# Patient Record
Sex: Female | Born: 1965 | Race: White | Hispanic: No | Marital: Single | Smoking: Current every day smoker
Health system: Southern US, Community
[De-identification: ages and names within clinical notes are randomized; demographics above are authoritative.]

## PROBLEM LIST (undated history)

## (undated) DIAGNOSIS — I10 Essential (primary) hypertension: Secondary | ICD-10-CM

## (undated) DIAGNOSIS — M5136 Other intervertebral disc degeneration, lumbar region: Secondary | ICD-10-CM

## (undated) DIAGNOSIS — J449 Chronic obstructive pulmonary disease, unspecified: Secondary | ICD-10-CM

## (undated) DIAGNOSIS — J9621 Acute and chronic respiratory failure with hypoxia: Secondary | ICD-10-CM

## (undated) DIAGNOSIS — K259 Gastric ulcer, unspecified as acute or chronic, without hemorrhage or perforation: Secondary | ICD-10-CM

## (undated) DIAGNOSIS — F32A Depression, unspecified: Secondary | ICD-10-CM

## (undated) DIAGNOSIS — F329 Major depressive disorder, single episode, unspecified: Secondary | ICD-10-CM

## (undated) DIAGNOSIS — R9431 Abnormal electrocardiogram [ECG] [EKG]: Secondary | ICD-10-CM

## (undated) DIAGNOSIS — M1990 Unspecified osteoarthritis, unspecified site: Secondary | ICD-10-CM

## (undated) DIAGNOSIS — N179 Acute kidney failure, unspecified: Secondary | ICD-10-CM

## (undated) DIAGNOSIS — M5126 Other intervertebral disc displacement, lumbar region: Secondary | ICD-10-CM

## (undated) DIAGNOSIS — M5430 Sciatica, unspecified side: Secondary | ICD-10-CM

## (undated) DIAGNOSIS — R6510 Systemic inflammatory response syndrome (SIRS) of non-infectious origin without acute organ dysfunction: Secondary | ICD-10-CM

## (undated) DIAGNOSIS — M51369 Other intervertebral disc degeneration, lumbar region without mention of lumbar back pain or lower extremity pain: Secondary | ICD-10-CM

## (undated) HISTORY — DX: Unspecified osteoarthritis, unspecified site: M19.90

## (undated) HISTORY — DX: Systemic inflammatory response syndrome (sirs) of non-infectious origin without acute organ dysfunction: R65.10

## (undated) HISTORY — DX: Acute and chronic respiratory failure with hypoxia: J96.21

## (undated) HISTORY — PX: KNEE SURGERY: SHX244

## (undated) HISTORY — PX: CHOLECYSTECTOMY: SHX55

## (undated) HISTORY — DX: Abnormal electrocardiogram (ECG) (EKG): R94.31

## (undated) HISTORY — DX: Acute kidney failure, unspecified: N17.9

## (undated) HISTORY — PX: APPENDECTOMY: SHX54

## (undated) HISTORY — DX: Gastric ulcer, unspecified as acute or chronic, without hemorrhage or perforation: K25.9

## (undated) HISTORY — PX: ABDOMINAL HYSTERECTOMY: SHX81

## (undated) HISTORY — PX: FOOT SURGERY: SHX648

---
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
## 2016-02-12 ENCOUNTER — Emergency Department (HOSPITAL_COMMUNITY)
Admission: EM | Admit: 2016-02-12 | Discharge: 2016-02-12 | Disposition: A | Discharge status: Home / Self Care | Payer: No Typology Code available for payment source | Attending: Emergency Medicine | Admitting: Emergency Medicine

## 2016-02-12 ENCOUNTER — Encounter (HOSPITAL_COMMUNITY): Payer: Self-pay

## 2016-02-12 ENCOUNTER — Emergency Department (HOSPITAL_COMMUNITY): Payer: No Typology Code available for payment source

## 2016-02-12 DIAGNOSIS — M25571 Pain in right ankle and joints of right foot: Secondary | ICD-10-CM | POA: Insufficient documentation

## 2016-02-12 DIAGNOSIS — Y92009 Unspecified place in unspecified non-institutional (private) residence as the place of occurrence of the external cause: Secondary | ICD-10-CM | POA: Insufficient documentation

## 2016-02-12 DIAGNOSIS — Y999 Unspecified external cause status: Secondary | ICD-10-CM | POA: Diagnosis not present

## 2016-02-12 DIAGNOSIS — F17200 Nicotine dependence, unspecified, uncomplicated: Secondary | ICD-10-CM | POA: Diagnosis not present

## 2016-02-12 DIAGNOSIS — W01198A Fall on same level from slipping, tripping and stumbling with subsequent striking against other object, initial encounter: Secondary | ICD-10-CM | POA: Diagnosis not present

## 2016-02-12 DIAGNOSIS — M25572 Pain in left ankle and joints of left foot: Secondary | ICD-10-CM | POA: Insufficient documentation

## 2016-02-12 DIAGNOSIS — W19XXXA Unspecified fall, initial encounter: Secondary | ICD-10-CM

## 2016-02-12 DIAGNOSIS — Y939 Activity, unspecified: Secondary | ICD-10-CM | POA: Diagnosis not present

## 2016-02-12 DIAGNOSIS — M25561 Pain in right knee: Secondary | ICD-10-CM | POA: Diagnosis present

## 2016-02-12 MED ORDER — HYDROCODONE-ACETAMINOPHEN 5-325 MG PO TABS: 1.0000 | ORAL_TABLET | Freq: Four times a day (QID) | ORAL | 0 refills | Status: DC | PRN

## 2016-02-12 MED ORDER — OXYCODONE-ACETAMINOPHEN 5-325 MG PO TABS
1.0000 | ORAL_TABLET | Freq: Once | ORAL | Status: AC
  Administered 2016-02-12: 1 via ORAL
  Filled 2016-02-12: qty 1

## 2016-02-12 MED ORDER — NAPROXEN 500 MG PO TABS: 500.0000 mg | ORAL_TABLET | Freq: Two times a day (BID) | ORAL | 0 refills | Status: DC

## 2016-02-12 NOTE — Progress Notes (Signed)
Orthopedic Tech Progress Note Patient Details:  Verdie MosherJulie Owensby 03/12/1966 [id number]  Ortho Devices Type of Ortho Device: Ace wrap, Crutches, Knee Immobilizer Ortho Device/Splint Location: knee imm. r, bi ankle ace wrap Ortho Device/Splint Interventions: Ordered, Application   Trinna PostMartinez, Lamonica Trueba J 02/12/2016, 9:32 PM

## 2016-02-12 NOTE — ED Triage Notes (Signed)
Pt states slipped in water, struck both knees on ground. Pt complaining of bilateral knee pain. Pt ambulatory at triage. No obvious deformity or swelling.

## 2016-02-12 NOTE — ED Provider Notes (Signed)
[facility]-EMERGENCY DEPT Provider Note   CSN: [id number] Arrival date & time: 02/12/16  1949  First Provider Contact:  None    By signing my name below, I, Octavia Heir, attest that this documentation has been prepared under the direction and in the presence of Felicie Morn, NP.  Electronically Signed: Octavia Heir, ED Scribe. 02/12/16. 8:04 PM.    History   Chief Complaint Chief Complaint  Patient presents with  . Fall   The history is provided by the patient. No language interpreter was used.  Fall  This is a new problem. The current episode started 1 to 2 hours ago. The problem occurs rarely. The problem has been gradually worsening. The symptoms are aggravated by walking. The symptoms are relieved by ice. She has tried nothing for the symptoms.   HPI Comments: Bethany Stuart is a 50 y.o. female who presents to the Emergency Department complaining of sudden onset, gradual worsening, moderate, bilateral knee pain s/p a fall that occurred PTA. She notes associated lower back pain and states her right leg and ankle are giving her pain. Pt reports she slipped on water and fell forward landing on her right knee. She states that her feet went underneath her. Pt is ambulatory without difficulty. She notes having ten screws in her right ankle due to having a fracture in the past. Pt did not hit her head or lose consciousness. She denies any other complaints.  History reviewed. No pertinent past medical history.  There are no active problems to display for this patient.   History reviewed. No pertinent surgical history.  OB History    No data available       Home Medications    Prior to Admission medications   Not on File    Family History History reviewed. No pertinent family history.  Social History Social History  Substance Use Topics  . Smoking status: Current Every Day Smoker  . Smokeless tobacco: Never Used  . Alcohol use No     Allergies   Penicillins   Review  of Systems Review of Systems  Musculoskeletal: Positive for arthralgias.     Physical Exam Updated Vital Signs BP (!) 191/110   Pulse 77   Temp 98.4 F (36.9 C) (Oral)   Resp 16   Ht  (1.676 m)   Wt 172 lb (78 kg)   SpO2 96%   BMI 27.76 kg/m   Physical Exam  Constitutional: She is oriented to person, place, and time. She appears well-developed and well-nourished.  HENT:  Head: Normocephalic.  Eyes: EOM are normal.  Neck: Normal range of motion.  Pulmonary/Chest: Effort normal.  Abdominal: She exhibits no distension.  Musculoskeletal: Normal range of motion. She exhibits edema and tenderness.  TTP to right knee, swelling to the right ankle Hips stable Lumbar back discomfort,   Neurological: She is alert and oriented to person, place, and time.  Sensation intact to all extremities  Psychiatric: She has a normal mood and affect.  Nursing note and vitals reviewed.    ED Treatments / Results  DIAGNOSTIC STUDIES: Oxygen Saturation is 96% on RA, adequate by my interpretation.  COORDINATION OF CARE:  8:41 PM Discussed treatment plan with pt at bedside and pt agreed to plan.  Labs (all labs ordered are listed, but only abnormal results are displayed) Labs Reviewed - No data to display  EKG  EKG Interpretation None       Radiology Dg Ankle Complete Left  Result Date: 02/12/2016 CLINICAL DATA:  Acute left ankle pain after fall today. EXAM: LEFT ANKLE COMPLETE - 3+ VIEW COMPARISON:  None. FINDINGS: There is no evidence of fracture, dislocation, or joint effusion. There is no evidence of arthropathy or other focal bone abnormality. Soft tissues are unremarkable. IMPRESSION: Normal left ankle. Electronically Signed   By: Lupita RaiderJames  Green Jr, M.D.   On: 02/12/2016 20:43   Dg Ankle Complete Right  Result Date: 02/12/2016 CLINICAL DATA:  Acute right ankle pain after fall today. EXAM: RIGHT ANKLE - COMPLETE 3+ VIEW COMPARISON:  None. FINDINGS: Status post surgical internal  fixation of old distal fibular and tibial fractures. No acute fracture or dislocation is noted. Joint spaces are intact. No soft tissue abnormality is noted. IMPRESSION: Postsurgical changes as described above. No acute abnormality seen in the right ankle. Electronically Signed   By: Lupita RaiderJames  Green Jr, M.D.   On: 02/12/2016 20:45   Dg Knee Complete 4 Views Right  Result Date: 02/12/2016 CLINICAL DATA:  Right knee pain and swelling after fall today. EXAM: RIGHT KNEE - COMPLETE 4+ VIEW COMPARISON:  None. FINDINGS: No evidence of fracture, dislocation, or joint effusion. Joint spaces are intact. Mild osteophyte formation is noted medially and laterally. Soft tissues are unremarkable. IMPRESSION: Mild degenerative changes are noted. No acute abnormality seen in the right knee. Electronically Signed   By: Lupita RaiderJames  Green Jr, M.D.   On: 02/12/2016 20:41    Procedures Procedures (including critical care time)  Medications Ordered in ED Medications - No data to display      Initial Impression / Assessment and Plan / ED Course  I have reviewed the triage vital signs and the nursing notes.  Pertinent labs & imaging results that were available during my care of the patient were reviewed by me and considered in my medical decision making (see chart for details).  Clinical Course  Patient X-Ray negative for obvious fracture or dislocation.  Pt advised to follow up with orthopedics. Patient given knee splint, ankle ace wrap, and crutches while in ED, conservative therapy recommended and discussed. Patient will be discharged home & is agreeable with above plan. Returns precautions discussed. Pt appears safe for discharge.    Final Clinical Impressions(s) / ED Diagnoses   Final diagnoses:  None  Fall at home after slipping on water on floor. Right knee pain. Bilateral ankle pain.   I personally performed the services described in this documentation, which was scribed in my presence. The recorded  information has been reviewed and is accurate.    New Prescriptions New Prescriptions   No medications on file     Felicie MornDavid Jameila Keeny, NP 02/13/16 [id number]    Pricilla LovelessScott Goldston, MD 02/15/16 [phone]

## 2017-05-26 DIAGNOSIS — I10 Essential (primary) hypertension: Secondary | ICD-10-CM

## 2017-05-26 DIAGNOSIS — J441 Chronic obstructive pulmonary disease with (acute) exacerbation: Secondary | ICD-10-CM

## 2017-05-26 DIAGNOSIS — E663 Overweight: Secondary | ICD-10-CM

## 2017-05-26 DIAGNOSIS — Z720 Tobacco use: Secondary | ICD-10-CM

## 2017-05-27 DIAGNOSIS — R0602 Shortness of breath: Secondary | ICD-10-CM

## 2017-07-14 DIAGNOSIS — Z720 Tobacco use: Secondary | ICD-10-CM

## 2017-07-14 DIAGNOSIS — Z716 Tobacco abuse counseling: Secondary | ICD-10-CM

## 2017-07-14 DIAGNOSIS — J441 Chronic obstructive pulmonary disease with (acute) exacerbation: Secondary | ICD-10-CM

## 2017-07-14 DIAGNOSIS — J189 Pneumonia, unspecified organism: Secondary | ICD-10-CM

## 2017-07-14 DIAGNOSIS — E876 Hypokalemia: Secondary | ICD-10-CM

## 2018-01-08 ENCOUNTER — Emergency Department (HOSPITAL_COMMUNITY)
Admission: EM | Admit: 2018-01-08 | Discharge: 2018-01-09 | Disposition: A | Discharge status: Home / Self Care | Payer: Medicaid Other | Attending: Emergency Medicine | Admitting: Emergency Medicine

## 2018-01-08 ENCOUNTER — Other Ambulatory Visit: Payer: Self-pay

## 2018-01-08 ENCOUNTER — Encounter (HOSPITAL_COMMUNITY): Payer: Self-pay | Admitting: Emergency Medicine

## 2018-01-08 DIAGNOSIS — M5432 Sciatica, left side: Secondary | ICD-10-CM | POA: Insufficient documentation

## 2018-01-08 DIAGNOSIS — M545 Low back pain: Secondary | ICD-10-CM | POA: Diagnosis present

## 2018-01-08 DIAGNOSIS — M5431 Sciatica, right side: Secondary | ICD-10-CM | POA: Diagnosis not present

## 2018-01-08 DIAGNOSIS — F17200 Nicotine dependence, unspecified, uncomplicated: Secondary | ICD-10-CM | POA: Insufficient documentation

## 2018-01-08 NOTE — ED Triage Notes (Signed)
Pt states she has hx of sciatica and bulging discs.  Unable to control pain at home with ibuprofen. Radiates down right leg.

## 2018-01-09 LAB — URINALYSIS, ROUTINE W REFLEX MICROSCOPIC
Bilirubin Urine: NEGATIVE
Glucose, UA: NEGATIVE mg/dL
Hgb urine dipstick: NEGATIVE
Ketones, ur: NEGATIVE mg/dL
LEUKOCYTES UA: NEGATIVE
NITRITE: NEGATIVE
Protein, ur: NEGATIVE mg/dL
SPECIFIC GRAVITY, URINE: 1.009 (ref 1.005–1.030)
pH: 5 (ref 5.0–8.0)

## 2018-01-09 MED ORDER — HYDROCODONE-ACETAMINOPHEN 5-325 MG PO TABS: 1.0000 | ORAL_TABLET | ORAL | 0 refills | Status: DC | PRN

## 2018-01-09 MED ORDER — METHOCARBAMOL 500 MG PO TABS: 500.0000 mg | ORAL_TABLET | Freq: Two times a day (BID) | ORAL | 0 refills | Status: DC

## 2018-01-09 MED ORDER — PREDNISONE 10 MG PO TABS: ORAL_TABLET | ORAL | 0 refills | Status: DC

## 2018-01-09 MED ORDER — HYDROCODONE-ACETAMINOPHEN 5-325 MG PO TABS
2.0000 | ORAL_TABLET | Freq: Once | ORAL | Status: AC
  Administered 2018-01-09: 2 via ORAL
  Filled 2018-01-09: qty 2

## 2018-01-09 MED ORDER — KETOROLAC TROMETHAMINE 30 MG/ML IJ SOLN
30.0000 mg | Freq: Once | INTRAMUSCULAR | Status: AC
  Administered 2018-01-09: 30 mg via INTRAMUSCULAR
  Filled 2018-01-09: qty 1

## 2018-01-09 NOTE — Discharge Instructions (Addendum)
Follow up with your Physician for recheck  

## 2018-01-09 NOTE — ED Provider Notes (Signed)
[facility] EMERGENCY DEPARTMENT Provider Note   CSN: [id number] Arrival date & time: 01/08/18  2326     History   Chief Complaint Chief Complaint  Patient presents with  . Back Pain    HPI Bethany Stuart is a 52 y.o. female.  The history is provided by the patient. No language interpreter was used.  Back Pain   This is a new problem. The problem occurs constantly. The problem has been gradually worsening. The pain is associated with no known injury. The pain is present in the lumbar spine. The quality of the pain is described as shooting. The pain radiates to the right thigh. The pain is moderate. She has tried nothing for the symptoms. The treatment provided no relief.  Pt reports she has a history of sciatica.  Pt complains of pain radiating from low back down right leg.  Pt complains of pain shooting into vaginal area dn rectum.  No incontience.   History reviewed. No pertinent past medical history.  There are no active problems to display for this patient.   History reviewed. No pertinent surgical history.   OB History   None      Home Medications    Prior to Admission medications   Medication Sig Start Date End Date Taking? Authorizing Provider  HYDROcodone-acetaminophen (NORCO/VICODIN) 5-325 MG tablet Take 1 tablet by mouth every 6 (six) hours as needed for severe pain. 02/12/16   Felicie Morn, NP  naproxen (NAPROSYN) 500 MG tablet Take 1 tablet (500 mg total) by mouth 2 (two) times daily. 02/12/16   Felicie Morn, NP    Family History No family history on file.  Social History Social History   Tobacco Use  . Smoking status: Current Every Day Smoker  . Smokeless tobacco: Never Used  Substance Use Topics  . Alcohol use: No  . Drug use: No     Allergies   Penicillins   Review of Systems Review of Systems  Musculoskeletal: Positive for back pain.  All other systems reviewed and are negative.    Physical Exam Updated Vital Signs BP  (!) 140/97 (BP Location: Right Arm)   Pulse 80   Temp 98.3 F (36.8 C) (Oral)   Resp 18   Ht 5\' 7"  (1.702 m)   Wt 74.4 kg (164 lb)   SpO2 99%   BMI 25.69 kg/m   Physical Exam  Constitutional: She is oriented to person, place, and time. She appears well-developed and well-nourished.  HENT:  Head: Normocephalic.  Eyes: EOM are normal.  Neck: Normal range of motion.  Cardiovascular: Normal rate and regular rhythm.  Pulmonary/Chest: Effort normal.  Abdominal: Soft. She exhibits no distension.  Musculoskeletal: Normal range of motion. She exhibits tenderness.  Neurological: She is alert and oriented to person, place, and time.  Skin: Skin is warm.  Psychiatric: She has a normal mood and affect.  Nursing note and vitals reviewed.    ED Treatments / Results  Labs (all labs ordered are listed, but only abnormal results are displayed) Labs Reviewed  URINALYSIS, ROUTINE W REFLEX MICROSCOPIC    EKG None  Radiology No results found.  Procedures Procedures (including critical care time)  Medications Ordered in ED Medications  HYDROcodone-acetaminophen (NORCO/VICODIN) 5-325 MG per tablet 2 tablet (2 tablets Oral Given 01/09/18 0041)     Initial Impression / Assessment and Plan / ED Course  I have reviewed the triage vital signs and the nursing notes.  Pertinent labs & imaging results that were  available during my care of the patient were reviewed by me and considered in my medical decision making (see chart for details).  Clinical Course as of Jan 10 52  Fri Jan 09, 2018  0036 Urinalysis, Routine w reflex microscopic [LS]    Clinical Course User Index [LS] Elson AreasSofia, Felma Pfefferle K, PA-C    Pt given hydrocodone 2 tablets here.    Final Clinical Impressions(s) / ED Diagnoses   Final diagnoses:  None    ED Discharge Orders    None       Osie CheeksSofia, Joelyn Lover K, PA-C 01/09/18 Geralyn [id number]    Delo, Douglas, MD 01/09/18 [phone]

## 2018-01-12 ENCOUNTER — Emergency Department (HOSPITAL_COMMUNITY): Payer: Medicaid Other

## 2018-01-12 ENCOUNTER — Emergency Department (HOSPITAL_COMMUNITY)
Admission: EM | Admit: 2018-01-12 | Discharge: 2018-01-13 | Disposition: A | Discharge status: Home / Self Care | Payer: Medicaid Other | Attending: Emergency Medicine | Admitting: Emergency Medicine

## 2018-01-12 ENCOUNTER — Other Ambulatory Visit: Payer: Self-pay

## 2018-01-12 ENCOUNTER — Encounter (HOSPITAL_COMMUNITY): Payer: Self-pay

## 2018-01-12 DIAGNOSIS — M5441 Lumbago with sciatica, right side: Secondary | ICD-10-CM | POA: Insufficient documentation

## 2018-01-12 DIAGNOSIS — G8929 Other chronic pain: Secondary | ICD-10-CM

## 2018-01-12 DIAGNOSIS — M545 Low back pain: Secondary | ICD-10-CM | POA: Diagnosis present

## 2018-01-12 DIAGNOSIS — F17210 Nicotine dependence, cigarettes, uncomplicated: Secondary | ICD-10-CM | POA: Insufficient documentation

## 2018-01-12 HISTORY — DX: Other intervertebral disc degeneration, lumbar region without mention of lumbar back pain or lower extremity pain: M51.369

## 2018-01-12 HISTORY — DX: Other intervertebral disc degeneration, lumbar region: M51.36

## 2018-01-12 HISTORY — DX: Sciatica, unspecified side: M54.30

## 2018-01-12 HISTORY — DX: Other intervertebral disc displacement, lumbar region: M51.26

## 2018-01-12 MED ORDER — OXYCODONE-ACETAMINOPHEN 5-325 MG PO TABS
1.0000 | ORAL_TABLET | Freq: Once | ORAL | Status: AC
  Administered 2018-01-12: 1 via ORAL
  Filled 2018-01-12: qty 1

## 2018-01-12 NOTE — ED Notes (Signed)
Patient transported to CT 

## 2018-01-12 NOTE — ED Provider Notes (Signed)
[facility] EMERGENCY DEPARTMENT Provider Note   CSN: [id number] Arrival date & time: 01/12/18  1935     History   Chief Complaint Chief Complaint  Patient presents with  . Back Pain    HPI Jameelah Watts is a 52 y.o. female.  HPI 52 year old Caucasian female past medical history significant for chronic back pain with herniated disc and sciatic nerve pain presents to the emergency department today for lower back pain.  Patient states is been for the past 1.5 weeks.  She states that she was seen on 7/4 for same symptoms and given steroids, muscle relaxers and narcotic pain medication which is not helping her pain.  Patient states the pain is worse with palpation and range of motion.  Denies any associated urinary symptoms.  She states the pain is cramping in nature.  Patient denies any change in bowel habits. Pt denies any ha, night sweats, hx of ivdu/cancer, loss or bowel or bladder, urinary retention, saddle paresthesias, lower extremity paresthesias.  Patient has not followed up with a specialist because she has no insurance.  Denies any recent trauma.  Family concerned that she may need an MRI.  Past Medical History:  Diagnosis Date  . Bulging lumbar disc   . Sciatica     There are no active problems to display for this patient.   History reviewed. No pertinent surgical history.   OB History   None      Home Medications    Prior to Admission medications   Medication Sig Start Date End Date Taking? Authorizing Provider  HYDROcodone-acetaminophen (NORCO/VICODIN) 5-325 MG tablet Take 1 tablet by mouth every 4 (four) hours as needed. 01/09/18   Elson Areas, PA-C  methocarbamol (ROBAXIN) 500 MG tablet Take 1 tablet (500 mg total) by mouth 2 (two) times daily. 01/09/18   Elson Areas, PA-C  predniSONE (DELTASONE) 10 MG tablet 6,5,4,3,2,1 taper 01/09/18   Elson Areas, PA-C    Family History History reviewed. No pertinent family history.  Social  History Social History   Tobacco Use  . Smoking status: Current Every Day Smoker  . Smokeless tobacco: Never Used  Substance Use Topics  . Alcohol use: No  . Drug use: No     Allergies   Fioricet [butalbital-apap-caffeine]; Klonopin [clonazepam]; and Penicillins   Review of Systems Review of Systems  Constitutional: Negative for chills and fever.  Gastrointestinal: Negative for abdominal pain and vomiting.  Genitourinary: Negative for dysuria, frequency and hematuria.  Musculoskeletal: Positive for arthralgias and back pain.  Skin: Negative for color change.  Neurological: Negative for weakness and numbness.     Physical Exam Updated Vital Signs BP 103/70 (BP Location: Left Arm)   Pulse 80   Temp 98.5 F (36.9 C) (Oral)   Resp 15   Ht 5\' 7"  (1.702 m)   Wt 75.3 kg (166 lb)   SpO2 98%   BMI 26.00 kg/m   Physical Exam  Constitutional: She appears well-developed and well-nourished. No distress.  Tender to palpation of the L-spine and right lumbar paraspinal musculature.  Pain radiates to the right buttocks.  HENT:  Head: Normocephalic and atraumatic.  Eyes: Right eye exhibits no discharge. Left eye exhibits no discharge. No scleral icterus.  Neck: Normal range of motion.  Pulmonary/Chest: No respiratory distress.  Abdominal: Soft. Bowel sounds are normal. She exhibits no distension. There is no tenderness. There is no rebound and no guarding.  Musculoskeletal: Normal range of motion.  Back:  Full range of motion of all joints of the lower extremities.  DP pulses are 2+ bilaterally.  Brisk cap refill.  Compartments are soft.  Neurological: She is alert.  Strength 5 out of 5 in lower extremities.  Sensation intact.  Skin: Skin is warm and dry. Capillary refill takes less than 2 seconds. No pallor.  No rash or tick bite noted.  Psychiatric: Her behavior is normal. Judgment and thought content normal.  Nursing note and vitals reviewed.    ED Treatments /  Results  Labs (all labs ordered are listed, but only abnormal results are displayed) Labs Reviewed  I-STAT CREATININE, ED    EKG None  Radiology No results found.  Procedures Procedures (including critical care time)  Medications Ordered in ED Medications  oxyCODONE-acetaminophen (PERCOCET/ROXICET) 5-325 MG per tablet 1 tablet (1 tablet Oral Given 01/12/18 2313)     Initial Impression / Assessment and Plan / ED Course  I have reviewed the triage vital signs and the nursing notes.  Pertinent labs & imaging results that were available during my care of the patient were reviewed by me and considered in my medical decision making (see chart for details).     Patient with back pain.  History of chronic back pain.  Seen 4 days ago with same symptoms and given medication at that time.  Patient in normal urine at that time.  Doubt pyelonephritis or UTI.  Given patient's return to ED if further imaging was obtained including CT renal stone study.  This showed a chronic back changes but no acute findings.  Patient's kidney function was slightly elevated at 1.5.  Unknown baseline.  Instructed patient to have this followed up with her primary care doctor.  Instructed to avoid a significant amount of NSAIDs.  Patient's has a hypokalemia 3.0 she was given oral potassium will be discharged with several days of oral potassium and have this followed up with her primary care doctor and recheck.  Patient's pain is reproducible on palpation.  Seems consistent muscular skeletal or sciatic nerve pain.  No neurological deficits and normal neuro exam.  Patient can walk but states is painful.  No loss of bowel or bladder control.  No concern for cauda equina.  No fever, night sweats, weight loss, h/o cancer, IVDU.  RICE protocol and pain medicine indicated and discussed with patient.  Pt is hemodynamically stable, in NAD, & able to ambulate in the ED. Evaluation does not show pathology that would require ongoing  emergent intervention or inpatient treatment. I explained the diagnosis to the patient. Pain has been managed & has no complaints prior to dc. Pt is comfortable with above plan and is stable for discharge at this time. All questions were answered prior to disposition. Strict return precautions for f/u to the ED were discussed. Encouraged follow up with PCP.     Final Clinical Impressions(s) / ED Diagnoses   Final diagnoses:  Chronic right-sided low back pain with right-sided sciatica    ED Discharge Orders        Ordered    lidocaine (LIDODERM) 5 %  Every 24 hours     01/13/18 0040    oxyCODONE-acetaminophen (PERCOCET/ROXICET) 5-325 MG tablet  Every 4 hours PRN     01/13/18 0040    potassium chloride (K-DUR) 10 MEQ tablet  Daily     01/13/18 0042       Rise MuLeaphart, Kenneth T, PA-C 01/13/18 0044    Maia PlanLong, Joshua G, MD 01/13/18 1115

## 2018-01-12 NOTE — ED Notes (Signed)
ED Provider at bedside. 

## 2018-01-12 NOTE — ED Triage Notes (Signed)
Pt endorses lower back pain for several weeks, has hx of sciatica and bulging discs. Was seen 07/04 here and given steroids, muscle relaxer and pain medications and it is not helping. VSS. No urinary or neuro sx.

## 2018-01-13 LAB — I-STAT CHEM 8, ED
BUN: 25 mg/dL — AB (ref 6–20)
CREATININE: 1.5 mg/dL — AB (ref 0.44–1.00)
Calcium, Ion: 1.14 mmol/L — ABNORMAL LOW (ref 1.15–1.40)
Chloride: 99 mmol/L (ref 98–111)
Glucose, Bld: 91 mg/dL (ref 70–99)
HEMATOCRIT: 48 % — AB (ref 36.0–46.0)
Hemoglobin: 16.3 g/dL — ABNORMAL HIGH (ref 12.0–15.0)
Potassium: 3 mmol/L — ABNORMAL LOW (ref 3.5–5.1)
SODIUM: 140 mmol/L (ref 135–145)
TCO2: 28 mmol/L (ref 22–32)

## 2018-01-13 LAB — I-STAT CREATININE, ED: Creatinine, Ser: 1.4 mg/dL — ABNORMAL HIGH (ref 0.44–1.00)

## 2018-01-13 MED ORDER — POTASSIUM CHLORIDE ER 10 MEQ PO TBCR: 10.0000 meq | EXTENDED_RELEASE_TABLET | Freq: Every day | ORAL | 0 refills | Status: DC

## 2018-01-13 MED ORDER — POTASSIUM CHLORIDE CRYS ER 20 MEQ PO TBCR
40.0000 meq | EXTENDED_RELEASE_TABLET | Freq: Once | ORAL | Status: AC
  Administered 2018-01-13: 40 meq via ORAL
  Filled 2018-01-13: qty 2

## 2018-01-13 MED ORDER — LIDOCAINE 5 % EX PTCH: 1.0000 | MEDICATED_PATCH | CUTANEOUS | 0 refills | Status: DC

## 2018-01-13 MED ORDER — OXYCODONE-ACETAMINOPHEN 5-325 MG PO TABS: 1.0000 | ORAL_TABLET | ORAL | 0 refills | Status: DC | PRN

## 2018-01-13 NOTE — ED Notes (Signed)
Patient Alert and oriented to baseline. Stable and ambulatory to baseline. Patient verbalized understanding of the discharge instructions.  Patient belongings were taken by the patient.   

## 2018-01-13 NOTE — Discharge Instructions (Signed)
The CT of your abdomen and back did not show any acute findings.  Does note chronic back problems.  This is likely causing her pain.  Your kidney function is mildly elevated.  Make sure drink plenty of fluids and have this rechecked with her primary care doctor.  Your potassium was also slightly low.  Please take the potassium that I gave you for the next 3 to 4 days.  Make sure you are increasing potassium at home and have this rechecked with your primary care doctor.  Have given you lidocaine patches to apply to your back to help with pain.  Have given you a short course of stronger pain medication this medication is a narcotic pain medication make you drowsy so and can be addicting so be careful with taking this medication.  Avoid any prolonged NSAID use.  Please follow-up with primary care doctor and orthopedic doctor.  SEEK IMMEDIATE MEDICAL ATTENTION IF: New numbness, tingling, weakness, or problem with the use of your arms or legs.  Severe back pain not relieved with medications.  Change in bowel or bladder control.  Urinary retention.  Numbness in your groin.  Increasing pain in any areas of the body (such as chest or abdominal pain).  Shortness of breath, dizziness or fainting.  Nausea (feeling sick to your stomach), vomiting, fever, or sweats.

## 2018-04-12 ENCOUNTER — Encounter (HOSPITAL_BASED_OUTPATIENT_CLINIC_OR_DEPARTMENT_OTHER): Payer: Self-pay | Admitting: Emergency Medicine

## 2018-04-12 ENCOUNTER — Other Ambulatory Visit: Payer: Self-pay

## 2018-04-12 ENCOUNTER — Emergency Department (HOSPITAL_BASED_OUTPATIENT_CLINIC_OR_DEPARTMENT_OTHER)
Admission: EM | Admit: 2018-04-12 | Discharge: 2018-04-12 | Disposition: A | Discharge status: Home / Self Care | Payer: Medicaid Other | Attending: Emergency Medicine | Admitting: Emergency Medicine

## 2018-04-12 DIAGNOSIS — J449 Chronic obstructive pulmonary disease, unspecified: Secondary | ICD-10-CM | POA: Diagnosis not present

## 2018-04-12 DIAGNOSIS — F17200 Nicotine dependence, unspecified, uncomplicated: Secondary | ICD-10-CM | POA: Diagnosis not present

## 2018-04-12 DIAGNOSIS — M5416 Radiculopathy, lumbar region: Secondary | ICD-10-CM | POA: Diagnosis not present

## 2018-04-12 DIAGNOSIS — M545 Low back pain: Secondary | ICD-10-CM | POA: Diagnosis present

## 2018-04-12 DIAGNOSIS — Z79899 Other long term (current) drug therapy: Secondary | ICD-10-CM | POA: Diagnosis not present

## 2018-04-12 DIAGNOSIS — I10 Essential (primary) hypertension: Secondary | ICD-10-CM | POA: Diagnosis not present

## 2018-04-12 HISTORY — DX: Essential (primary) hypertension: I10

## 2018-04-12 HISTORY — DX: Chronic obstructive pulmonary disease, unspecified: J44.9

## 2018-04-12 MED ORDER — ACETAMINOPHEN ER 650 MG PO TBCR: 650.0000 mg | EXTENDED_RELEASE_TABLET | Freq: Three times a day (TID) | ORAL | 0 refills | Status: DC

## 2018-04-12 MED ORDER — PREDNISONE 10 MG PO TABS: ORAL_TABLET | ORAL | 0 refills | Status: DC

## 2018-04-12 MED ORDER — IBUPROFEN 600 MG PO TABS: 600.0000 mg | ORAL_TABLET | Freq: Four times a day (QID) | ORAL | 0 refills | Status: DC | PRN

## 2018-04-12 MED ORDER — LIDOCAINE 5 % EX PTCH
1.0000 | MEDICATED_PATCH | CUTANEOUS | Status: DC
  Filled 2018-04-12: qty 1

## 2018-04-12 MED ORDER — NAPROXEN 250 MG PO TABS
500.0000 mg | ORAL_TABLET | Freq: Once | ORAL | Status: AC
  Administered 2018-04-12: 500 mg via ORAL
  Filled 2018-04-12: qty 2

## 2018-04-12 MED ORDER — LIDOCAINE 5 % EX PTCH: 1.0000 | MEDICATED_PATCH | CUTANEOUS | 0 refills | Status: DC

## 2018-04-12 MED ORDER — OXYCODONE-ACETAMINOPHEN 5-325 MG PO TABS
1.0000 | ORAL_TABLET | Freq: Once | ORAL | Status: AC
  Administered 2018-04-12: 1 via ORAL
  Filled 2018-04-12: qty 1

## 2018-04-12 MED ORDER — OXYCODONE-ACETAMINOPHEN 5-325 MG PO TABS: 1.0000 | ORAL_TABLET | Freq: Two times a day (BID) | ORAL | 0 refills | Status: DC | PRN

## 2018-04-12 MED ORDER — METHOCARBAMOL 500 MG PO TABS
500.0000 mg | ORAL_TABLET | Freq: Once | ORAL | Status: AC
  Administered 2018-04-12: 500 mg via ORAL
  Filled 2018-04-12: qty 1

## 2018-04-12 NOTE — ED Provider Notes (Signed)
[facility] EMERGENCY DEPARTMENT Provider Note   CSN: [id number] Arrival date & time: 04/12/18  0731     History   Chief Complaint Chief Complaint  Patient presents with  . Back Pain    HPI Bethany Stuart is a 52 y.o. female.  HPI  52 year old female comes in with chief complaint of back pain. Patient has history of COPD, sciatica and degenerative disc disease.  Patient reports that her current pain started 4 days ago, soon after she had an epidural injection.  Her pain is located in the lower spine and is radiating down her right lower extremity.  Pain is described as sharp, without any associated numbness or tingling.  She also denies any urinary incontinence, urinary retention, bowel incontinence or saddle anesthesia. No specific evoking factors.  Patient has taken over-the-counter ibuprofen, muscle relaxants without significant pain relief.  Past Medical History:  Diagnosis Date  . Bulging lumbar disc   . COPD (chronic obstructive pulmonary disease) (HCC)   . Hypertension   . Sciatica     There are no active problems to display for this patient.   Past Surgical History:  Procedure Laterality Date  . ABDOMINAL HYSTERECTOMY    . APPENDECTOMY    . CHOLECYSTECTOMY       OB History   None      Home Medications    Prior to Admission medications   Medication Sig Start Date End Date Taking? Authorizing Provider  lisinopril (PRINIVIL,ZESTRIL) 20 MG tablet Take 20 mg by mouth daily.   Yes [provider]  acetaminophen (TYLENOL 8 HOUR) 650 MG CR tablet Take 1 tablet (650 mg total) by mouth every 8 (eight) hours. 04/12/18   Derwood Kaplan, MD  HYDROcodone-acetaminophen (NORCO/VICODIN) 5-325 MG tablet Take 1 tablet by mouth every 4 (four) hours as needed. 01/09/18   Elson Areas, PA-C  ibuprofen (ADVIL,MOTRIN) 600 MG tablet Take 1 tablet (600 mg total) by mouth every 6 (six) hours as needed. 04/12/18   Derwood Kaplan, MD  lidocaine (LIDODERM) 5 %  Place 1 patch onto the skin daily. 04/12/18   Derwood Kaplan, MD  methocarbamol (ROBAXIN) 500 MG tablet Take 1 tablet (500 mg total) by mouth 2 (two) times daily. 01/09/18   Elson Areas, PA-C  oxyCODONE-acetaminophen (PERCOCET/ROXICET) 5-325 MG tablet Take 1 tablet by mouth every 12 (twelve) hours as needed for severe pain. 04/12/18   Derwood Kaplan, MD  potassium chloride (K-DUR) 10 MEQ tablet Take 1 tablet (10 mEq total) by mouth daily. 01/13/18   Rise Mu, PA-C  predniSONE (DELTASONE) 10 MG tablet 6,5,4,3,2,1 taper 04/12/18   Derwood Kaplan, MD    Family History No family history on file.  Social History Social History   Tobacco Use  . Smoking status: Current Every Day Smoker  . Smokeless tobacco: Never Used  Substance Use Topics  . Alcohol use: No  . Drug use: No     Allergies   Fioricet [butalbital-apap-caffeine]; Klonopin [clonazepam]; and Penicillins   Review of Systems Review of Systems  Constitutional: Positive for activity change.  Cardiovascular: Negative for chest pain.  Gastrointestinal: Negative for nausea.  Musculoskeletal: Positive for back pain.     Physical Exam Updated Vital Signs BP (!) 176/93 (BP Location: Right Arm)   Pulse 77   Temp 98.3 F (36.8 C) (Oral)   Resp 20   Ht 5\' 7"  (1.702 m)   Wt 76.2 kg   SpO2 100%   BMI 26.31 kg/m   Physical Exam  Constitutional: She is oriented to person, place, and time. She appears well-developed.  HENT:  Head: Normocephalic and atraumatic.  Eyes: EOM are normal.  Neck: Normal range of motion. Neck supple.  Cardiovascular: Normal rate.  Pulmonary/Chest: Effort normal.  Abdominal: Bowel sounds are normal.  Musculoskeletal:  Pt has tenderness over the lumbar region No step offs, no erythema. Pt has 2+ patellar reflex bilaterally. Able to discriminate between sharp and dull. Able to ambulate Positive passive straight leg raise test  Neurological: She is alert and oriented to person, place,  and time.  Skin: Skin is warm and dry.  Nursing note and vitals reviewed.    ED Treatments / Results  Labs (all labs ordered are listed, but only abnormal results are displayed) Labs Reviewed - No data to display  EKG None  Radiology No results found.  Procedures Procedures (including critical care time)  Medications Ordered in ED Medications  lidocaine (LIDODERM) 5 % 1 patch (1 patch Transdermal Not Given 04/12/18 0802)  oxyCODONE-acetaminophen (PERCOCET/ROXICET) 5-325 MG per tablet 1 tablet (1 tablet Oral Given 04/12/18 0802)  naproxen (NAPROSYN) tablet 500 mg (500 mg Oral Given 04/12/18 0802)  methocarbamol (ROBAXIN) tablet 500 mg (500 mg Oral Given 04/12/18 0801)     Initial Impression / Assessment and Plan / ED Course  I have reviewed the triage vital signs and the nursing notes.  Pertinent labs & imaging results that were available during my care of the patient were reviewed by me and considered in my medical decision making (see chart for details).     52 year old female comes in with chief complaint of severe back pain that appears to be radicular in nature.  She has known degenerative disc disease of the lumbar spine, and it appears that she has attempted conservative measures at home without significant relief.  MRI results from the outpatient setting discussed and reviewed. No red flags on history or exam suggesting spinal cord compression or cauda equina.  No clinical concerns for epidural and infection at this time given lack of any fevers, chills, sweats.  Patient has a narcotic score of 270 -mainly because she has tramadol prescription that is active.  Patient's last received hydrocodone-oxycodone in July, when a total of around 35 mg tablets were dispensed.  Risk of opioid use discussed with the patient. We will give her 5 to 6 tablets for severe pain only.  Final Clinical Impressions(s) / ED Diagnoses   Final diagnoses:  Radiculopathy of lumbar region     ED Discharge Orders         Ordered    oxyCODONE-acetaminophen (PERCOCET/ROXICET) 5-325 MG tablet  Every 12 hours PRN     04/12/18 0828    acetaminophen (TYLENOL 8 HOUR) 650 MG CR tablet  Every 8 hours     04/12/18 0828    ibuprofen (ADVIL,MOTRIN) 600 MG tablet  Every 6 hours PRN     04/12/18 0828    predniSONE (DELTASONE) 10 MG tablet     04/12/18 0828    lidocaine (LIDODERM) 5 %  Every 24 hours     04/12/18 0831           Derwood Kaplan, MD 04/12/18 [phone]

## 2018-04-12 NOTE — Discharge Instructions (Signed)
You are seen in the ER for your pain. As discussed, the pain is because of your chronic degenerative disc disease and spine disease.  Please see your spine doctor and PCP for further care. We will be sending you home with multiple medications for appropriate pain control. Please be aware, narcotic medications are not the preferred way of treating chronic pain -therefore take the medication only when the pain is excruciating.

## 2018-04-12 NOTE — ED Notes (Signed)
ED Provider at bedside. 

## 2018-04-12 NOTE — ED Triage Notes (Signed)
Chronic low back pain. Has been seeing ortho and had an epidural injection 1 week ago. States the pain is worse since the injection. Pain radiates down R leg.

## 2018-06-03 ENCOUNTER — Emergency Department (HOSPITAL_BASED_OUTPATIENT_CLINIC_OR_DEPARTMENT_OTHER): Payer: Medicaid Other

## 2018-06-03 ENCOUNTER — Emergency Department (HOSPITAL_BASED_OUTPATIENT_CLINIC_OR_DEPARTMENT_OTHER)
Admission: EM | Admit: 2018-06-03 | Discharge: 2018-06-03 | Disposition: A | Discharge status: Home / Self Care | Payer: Medicaid Other | Attending: Emergency Medicine | Admitting: Emergency Medicine

## 2018-06-03 ENCOUNTER — Encounter (HOSPITAL_BASED_OUTPATIENT_CLINIC_OR_DEPARTMENT_OTHER): Payer: Self-pay | Admitting: Emergency Medicine

## 2018-06-03 ENCOUNTER — Other Ambulatory Visit: Payer: Self-pay

## 2018-06-03 DIAGNOSIS — Z79899 Other long term (current) drug therapy: Secondary | ICD-10-CM | POA: Diagnosis not present

## 2018-06-03 DIAGNOSIS — I10 Essential (primary) hypertension: Secondary | ICD-10-CM | POA: Insufficient documentation

## 2018-06-03 DIAGNOSIS — W19XXXA Unspecified fall, initial encounter: Secondary | ICD-10-CM

## 2018-06-03 DIAGNOSIS — F17210 Nicotine dependence, cigarettes, uncomplicated: Secondary | ICD-10-CM | POA: Diagnosis not present

## 2018-06-03 DIAGNOSIS — J449 Chronic obstructive pulmonary disease, unspecified: Secondary | ICD-10-CM | POA: Diagnosis not present

## 2018-06-03 DIAGNOSIS — S20211A Contusion of right front wall of thorax, initial encounter: Secondary | ICD-10-CM

## 2018-06-03 DIAGNOSIS — Y9389 Activity, other specified: Secondary | ICD-10-CM | POA: Insufficient documentation

## 2018-06-03 DIAGNOSIS — R1011 Right upper quadrant pain: Secondary | ICD-10-CM | POA: Diagnosis not present

## 2018-06-03 DIAGNOSIS — Y92002 Bathroom of unspecified non-institutional (private) residence single-family (private) house as the place of occurrence of the external cause: Secondary | ICD-10-CM | POA: Insufficient documentation

## 2018-06-03 DIAGNOSIS — W01198A Fall on same level from slipping, tripping and stumbling with subsequent striking against other object, initial encounter: Secondary | ICD-10-CM | POA: Insufficient documentation

## 2018-06-03 DIAGNOSIS — S299XXA Unspecified injury of thorax, initial encounter: Secondary | ICD-10-CM | POA: Diagnosis present

## 2018-06-03 DIAGNOSIS — Y999 Unspecified external cause status: Secondary | ICD-10-CM | POA: Diagnosis not present

## 2018-06-03 LAB — CBC WITH DIFFERENTIAL/PLATELET
Abs Immature Granulocytes: 0.04 10*3/uL (ref 0.00–0.07)
BASOS ABS: 0.1 10*3/uL (ref 0.0–0.1)
BASOS PCT: 1 %
EOS ABS: 0.3 10*3/uL (ref 0.0–0.5)
EOS PCT: 3 %
HCT: 48.6 % — ABNORMAL HIGH (ref 36.0–46.0)
Hemoglobin: 16.1 g/dL — ABNORMAL HIGH (ref 12.0–15.0)
IMMATURE GRANULOCYTES: 0 %
Lymphocytes Relative: 24 %
Lymphs Abs: 2.5 10*3/uL (ref 0.7–4.0)
MCH: 31 pg (ref 26.0–34.0)
MCHC: 33.1 g/dL (ref 30.0–36.0)
MCV: 93.6 fL (ref 80.0–100.0)
Monocytes Absolute: 0.7 10*3/uL (ref 0.1–1.0)
Monocytes Relative: 7 %
NEUTROS PCT: 65 %
NRBC: 0 % (ref 0.0–0.2)
Neutro Abs: 7.2 10*3/uL (ref 1.7–7.7)
PLATELETS: 240 10*3/uL (ref 150–400)
RBC: 5.19 MIL/uL — ABNORMAL HIGH (ref 3.87–5.11)
RDW: 13.7 % (ref 11.5–15.5)
WBC: 10.8 10*3/uL — ABNORMAL HIGH (ref 4.0–10.5)

## 2018-06-03 LAB — COMPREHENSIVE METABOLIC PANEL
ALK PHOS: 72 U/L (ref 38–126)
ALT: 15 U/L (ref 0–44)
ANION GAP: 9 (ref 5–15)
AST: 19 U/L (ref 15–41)
Albumin: 4.2 g/dL (ref 3.5–5.0)
BILIRUBIN TOTAL: 0.9 mg/dL (ref 0.3–1.2)
BUN: 11 mg/dL (ref 6–20)
CALCIUM: 9.3 mg/dL (ref 8.9–10.3)
CO2: 31 mmol/L (ref 22–32)
CREATININE: 0.86 mg/dL (ref 0.44–1.00)
Chloride: 102 mmol/L (ref 98–111)
GFR calc non Af Amer: >60 mL/min (ref >60)
Glucose, Bld: 134 mg/dL — ABNORMAL HIGH (ref 70–99)
Potassium: 2.9 mmol/L — ABNORMAL LOW (ref 3.5–5.1)
SODIUM: 142 mmol/L (ref 135–145)
TOTAL PROTEIN: 7.5 g/dL (ref 6.5–8.1)

## 2018-06-03 MED ORDER — IOPAMIDOL (ISOVUE-300) INJECTION 61%
100.0000 mL | Freq: Once | INTRAVENOUS | Status: AC | PRN
  Administered 2018-06-03: 100 mL via INTRAVENOUS

## 2018-06-03 MED ORDER — SODIUM CHLORIDE 0.9 % IV BOLUS
500.0000 mL | Freq: Once | INTRAVENOUS | Status: AC
  Administered 2018-06-03: 500 mL via INTRAVENOUS

## 2018-06-03 MED ORDER — MORPHINE SULFATE (PF) 4 MG/ML IV SOLN
4.0000 mg | Freq: Once | INTRAVENOUS | Status: AC
  Administered 2018-06-03: 4 mg via INTRAVENOUS
  Filled 2018-06-03: qty 1

## 2018-06-03 NOTE — ED Triage Notes (Signed)
Pt fell yesterday while cleaning the bathroom and hit R rib area on the tub. C/o pain today.

## 2018-06-03 NOTE — Discharge Instructions (Signed)
Take your pain medicines as needed for the rib pain.  Watch for signs of infection or more trouble breathing.  Your blood pressure was also elevated today.  May be somewhat due to the pain but needs to be follow with the primary care doctor.

## 2018-06-03 NOTE — ED Notes (Signed)
Patient transported to CT 

## 2018-06-03 NOTE — ED Provider Notes (Signed)
[facility] EMERGENCY DEPARTMENT Provider Note   CSN: [id number] Arrival date & time: 06/03/18  0720     History   Chief Complaint Chief Complaint  Patient presents with  . Fall    HPI Bethany Stuart is a 52 y.o. female.  HPI Patient presents after a fall yesterday.  She has chronic back pain and states her right leg gave out after going to the bathroom and hit her right lower ribs on the tub.  Since then has had pain.  Hurts to breathe.  Pain somewhat in the abdomen 2.  No relief with her pain medicine last night.  No chest pain.  Does have history hypertension.  No nausea or vomiting.  Is on chronic narcotic pain medicine for her back pain. Past Medical History:  Diagnosis Date  . Bulging lumbar disc   . COPD (chronic obstructive pulmonary disease) (HCC)   . Hypertension   . Sciatica     There are no active problems to display for this patient.   Past Surgical History:  Procedure Laterality Date  . ABDOMINAL HYSTERECTOMY    . APPENDECTOMY    . CHOLECYSTECTOMY       OB History   None      Home Medications    Prior to Admission medications   Medication Sig Start Date End Date Taking? Authorizing Provider  lisinopril (PRINIVIL,ZESTRIL) 20 MG tablet Take 20 mg by mouth daily.   Yes [provider]  acetaminophen (TYLENOL 8 HOUR) 650 MG CR tablet Take 1 tablet (650 mg total) by mouth every 8 (eight) hours. 04/12/18   Derwood KaplanNanavati, Ankit, MD  HYDROcodone-acetaminophen (NORCO/VICODIN) 5-325 MG tablet Take 1 tablet by mouth every 4 (four) hours as needed. 01/09/18   Elson AreasSofia, Leslie K, PA-C  ibuprofen (ADVIL,MOTRIN) 600 MG tablet Take 1 tablet (600 mg total) by mouth every 6 (six) hours as needed. 04/12/18   Derwood KaplanNanavati, Ankit, MD  lidocaine (LIDODERM) 5 % Place 1 patch onto the skin daily. 04/12/18   Derwood KaplanNanavati, Ankit, MD  methocarbamol (ROBAXIN) 500 MG tablet Take 1 tablet (500 mg total) by mouth 2 (two) times daily. 01/09/18   Elson AreasSofia, Leslie K, PA-C    oxyCODONE-acetaminophen (PERCOCET/ROXICET) 5-325 MG tablet Take 1 tablet by mouth every 12 (twelve) hours as needed for severe pain. 04/12/18   Derwood KaplanNanavati, Ankit, MD  potassium chloride (K-DUR) 10 MEQ tablet Take 1 tablet (10 mEq total) by mouth daily. 01/13/18   Rise MuLeaphart, Kenneth T, PA-C  predniSONE (DELTASONE) 10 MG tablet 6,5,4,3,2,1 taper 04/12/18   Derwood KaplanNanavati, Ankit, MD    Family History No family history on file.  Social History Social History   Tobacco Use  . Smoking status: Current Every Day Smoker  . Smokeless tobacco: Never Used  Substance Use Topics  . Alcohol use: No  . Drug use: No     Allergies   Fioricet [butalbital-apap-caffeine]; Klonopin [clonazepam]; and Penicillins   Review of Systems Review of Systems  Constitutional: Negative for appetite change.  HENT: Negative for congestion.   Respiratory: Negative for shortness of breath.   Cardiovascular: Positive for chest pain.  Gastrointestinal: Positive for abdominal pain.  Genitourinary: Positive for flank pain.  Musculoskeletal: Negative for back pain.  Skin: Negative for rash.  Neurological: Negative for numbness.  Psychiatric/Behavioral: Negative for behavioral problems.     Physical Exam Updated Vital Signs BP (!) 200/115 (BP Location: Right Arm)   Pulse 78   Temp 98.6 F (37 C) (Oral)   Resp 18   Ht  5\' 7"  (1.702 m)   Wt 79.4 kg   SpO2 95%   BMI 27.41 kg/m   Physical Exam  Constitutional: She appears well-developed.  HENT:  Head: Atraumatic.  Neck: Neck supple.  Cardiovascular: Normal rate.  Pulmonary/Chest: She exhibits tenderness.  Tenderness on right lateral and lower chest wall.  No crepitance deformity.  Equal breath sounds.  Abdominal: There is tenderness.  Right subcostal tenderness.  No ecchymosis.  Musculoskeletal: She exhibits no tenderness.  Neurological: She is alert.  Skin: Skin is warm. Capillary refill takes less than 2 seconds.     ED Treatments / Results  Labs (all  labs ordered are listed, but only abnormal results are displayed) Labs Reviewed  COMPREHENSIVE METABOLIC PANEL - Abnormal; Notable for the following components:      Result Value   Potassium 2.9 (*)    Glucose, Bld 134 (*)    All other components within normal limits  CBC WITH DIFFERENTIAL/PLATELET - Abnormal; Notable for the following components:   WBC 10.8 (*)    RBC 5.19 (*)    Hemoglobin 16.1 (*)    HCT 48.6 (*)    All other components within normal limits    EKG None  Radiology Dg Ribs Unilateral W/chest Right  Result Date: 06/03/2018 CLINICAL DATA:  Fall with right rib pain. Initial encounter. EXAM: RIGHT RIBS AND CHEST - 3+ VIEW COMPARISON:  None. FINDINGS: No fracture or other bone lesions are seen involving the ribs. There is no evidence of pneumothorax or pleural effusion. Both lungs are clear. Hyperinflation heart size and mediastinal contours are within normal limits. IMPRESSION: No acute finding. Hyperinflation correlating with COPD history. Electronically Signed   By: Marnee Spring M.D.   On: 06/03/2018 08:36   Ct Abdomen Pelvis W Contrast  Result Date: 06/03/2018 CLINICAL DATA:  Right upper quadrant abdominal pain after fall yesterday. EXAM: CT ABDOMEN AND PELVIS WITH CONTRAST TECHNIQUE: Multidetector CT imaging of the abdomen and pelvis was performed using the standard protocol following bolus administration of intravenous contrast. CONTRAST:  ISOVUE-300 IOPAMIDOL (ISOVUE-300) INJECTION 61% COMPARISON:  CT scan of January 12, 2018 and July 17, 2013. FINDINGS: Lower chest: No acute abnormality. Hepatobiliary: No focal liver abnormality is seen. Status post cholecystectomy. No biliary dilatation. Pancreas: Unremarkable. No pancreatic ductal dilatation or surrounding inflammatory changes. Spleen: Normal in size without focal abnormality. Adrenals/Urinary Tract: Stable hyperplasia left adrenal gland is noted. Right adrenal gland appears normal. No hydronephrosis or renal  obstruction is noted. No renal or ureteral calculi are noted. Urinary bladder is unremarkable. Stomach/Bowel: The stomach appears normal. There is no evidence of bowel obstruction or inflammation. Status post appendectomy. Vascular/Lymphatic: Aortic atherosclerosis. No enlarged abdominal or pelvic lymph nodes. Reproductive: Status post hysterectomy. No adnexal masses. Other: No abdominal wall hernia or abnormality. No abdominopelvic ascites. Musculoskeletal: Severe degenerative disc disease is noted at L3-4 and L4-5. No acute osseous abnormality is noted. IMPRESSION: No acute abnormality seen in the abdomen or pelvis. Stable multilevel degenerative disc disease is noted in lower lumbar spine. Aortic Atherosclerosis (ICD10-I70.0). Electronically Signed   By: Lupita Raider, M.D.   On: 06/03/2018 09:19    Procedures Procedures (including critical care time)  Medications Ordered in ED Medications  sodium chloride 0.9 % bolus 500 mL (0 mLs Intravenous Stopped 06/03/18 1008)  morphine 4 MG/ML injection 4 mg (4 mg Intravenous Given 06/03/18 0747)  iopamidol (ISOVUE-300) 61 % injection 100 mL (100 mLs Intravenous Contrast Given 06/03/18 0846)     Initial Impression /  Assessment and Plan / ED Course  I have reviewed the triage vital signs and the nursing notes.  Pertinent labs & imaging results that were available during my care of the patient were reviewed by me and considered in my medical decision making (see chart for details).     Patient with right-sided chest pain after fall.  X-ray of ribs reassuring and CT of the abdomen reassuring 2.  Also has hypertension.  History of same and took her medicines morning.  May be somewhat pain related.  Improved somewhat with pain medicine.  Will need outpatient follow-up.  Patient has chronic narcotics for her back pain.  Discharge home.  Final Clinical Impressions(s) / ED Diagnoses   Final diagnoses:  Fall, initial encounter  Contusion of right chest  wall, initial encounter  Hypertension, unspecified type    ED Discharge Orders    None       Benjiman Core, MD 06/03/18 1037

## 2018-06-14 ENCOUNTER — Inpatient Hospital Stay (HOSPITAL_BASED_OUTPATIENT_CLINIC_OR_DEPARTMENT_OTHER)
Admission: EM | Admit: 2018-06-14 | Discharge: 2018-06-18 | DRG: 280 | Disposition: A | Discharge status: Home / Self Care | Payer: Medicaid Other | Attending: Internal Medicine | Admitting: Internal Medicine

## 2018-06-14 ENCOUNTER — Other Ambulatory Visit: Payer: Self-pay

## 2018-06-14 ENCOUNTER — Emergency Department (HOSPITAL_BASED_OUTPATIENT_CLINIC_OR_DEPARTMENT_OTHER): Payer: Medicaid Other

## 2018-06-14 ENCOUNTER — Encounter (HOSPITAL_BASED_OUTPATIENT_CLINIC_OR_DEPARTMENT_OTHER): Payer: Self-pay | Admitting: Emergency Medicine

## 2018-06-14 DIAGNOSIS — G8929 Other chronic pain: Secondary | ICD-10-CM | POA: Diagnosis present

## 2018-06-14 DIAGNOSIS — N179 Acute kidney failure, unspecified: Secondary | ICD-10-CM | POA: Diagnosis present

## 2018-06-14 DIAGNOSIS — Z888 Allergy status to other drugs, medicaments and biological substances status: Secondary | ICD-10-CM | POA: Diagnosis not present

## 2018-06-14 DIAGNOSIS — J9621 Acute and chronic respiratory failure with hypoxia: Secondary | ICD-10-CM

## 2018-06-14 DIAGNOSIS — E876 Hypokalemia: Secondary | ICD-10-CM

## 2018-06-14 DIAGNOSIS — Z880 Allergy status to penicillin: Secondary | ICD-10-CM

## 2018-06-14 DIAGNOSIS — Z8249 Family history of ischemic heart disease and other diseases of the circulatory system: Secondary | ICD-10-CM

## 2018-06-14 DIAGNOSIS — R079 Chest pain, unspecified: Secondary | ICD-10-CM

## 2018-06-14 DIAGNOSIS — I214 Non-ST elevation (NSTEMI) myocardial infarction: Secondary | ICD-10-CM | POA: Diagnosis present

## 2018-06-14 DIAGNOSIS — J449 Chronic obstructive pulmonary disease, unspecified: Secondary | ICD-10-CM | POA: Diagnosis present

## 2018-06-14 DIAGNOSIS — I160 Hypertensive urgency: Secondary | ICD-10-CM | POA: Diagnosis not present

## 2018-06-14 DIAGNOSIS — I493 Ventricular premature depolarization: Secondary | ICD-10-CM | POA: Diagnosis not present

## 2018-06-14 DIAGNOSIS — Z23 Encounter for immunization: Secondary | ICD-10-CM

## 2018-06-14 DIAGNOSIS — Z79891 Long term (current) use of opiate analgesic: Secondary | ICD-10-CM

## 2018-06-14 DIAGNOSIS — Z79899 Other long term (current) drug therapy: Secondary | ICD-10-CM

## 2018-06-14 DIAGNOSIS — Z6830 Body mass index (BMI) 30.0-30.9, adult: Secondary | ICD-10-CM

## 2018-06-14 DIAGNOSIS — I25119 Atherosclerotic heart disease of native coronary artery with unspecified angina pectoris: Secondary | ICD-10-CM | POA: Diagnosis present

## 2018-06-14 DIAGNOSIS — M5431 Sciatica, right side: Secondary | ICD-10-CM | POA: Diagnosis present

## 2018-06-14 DIAGNOSIS — F17210 Nicotine dependence, cigarettes, uncomplicated: Secondary | ICD-10-CM | POA: Diagnosis present

## 2018-06-14 DIAGNOSIS — J441 Chronic obstructive pulmonary disease with (acute) exacerbation: Secondary | ICD-10-CM | POA: Diagnosis present

## 2018-06-14 HISTORY — DX: Chest pain, unspecified: R07.9

## 2018-06-14 LAB — D-DIMER, QUANTITATIVE: D-Dimer, Quant: 0.52 ug/mL-FEU — ABNORMAL HIGH (ref 0.00–0.50)

## 2018-06-14 LAB — CBC WITH DIFFERENTIAL/PLATELET
Abs Immature Granulocytes: 0.06 10*3/uL (ref 0.00–0.07)
Basophils Absolute: 0 10*3/uL (ref 0.0–0.1)
Basophils Relative: 0 %
Eosinophils Absolute: 0.2 10*3/uL (ref 0.0–0.5)
Eosinophils Relative: 2 %
HCT: 40 % (ref 36.0–46.0)
Hemoglobin: 12.7 g/dL (ref 12.0–15.0)
Immature Granulocytes: 1 %
Lymphocytes Relative: 31 %
Lymphs Abs: 3.3 10*3/uL (ref 0.7–4.0)
MCH: 31 pg (ref 26.0–34.0)
MCHC: 31.8 g/dL (ref 30.0–36.0)
MCV: 97.6 fL (ref 80.0–100.0)
Monocytes Absolute: 1.1 10*3/uL — ABNORMAL HIGH (ref 0.1–1.0)
Monocytes Relative: 10 %
Neutro Abs: 5.9 10*3/uL (ref 1.7–7.7)
Neutrophils Relative %: 56 %
Platelets: 170 10*3/uL (ref 150–400)
RBC: 4.1 MIL/uL (ref 3.87–5.11)
RDW: 13.9 % (ref 11.5–15.5)
WBC: 10.6 10*3/uL — ABNORMAL HIGH (ref 4.0–10.5)
nRBC: 0 % (ref 0.0–0.2)

## 2018-06-14 LAB — BASIC METABOLIC PANEL
Anion gap: 8 (ref 5–15)
BUN: 35 mg/dL — ABNORMAL HIGH (ref 6–20)
CO2: 29 mmol/L (ref 22–32)
Calcium: 9.2 mg/dL (ref 8.9–10.3)
Chloride: 100 mmol/L (ref 98–111)
Creatinine, Ser: 1.28 mg/dL — ABNORMAL HIGH (ref 0.44–1.00)
GFR calc Af Amer: 55 mL/min — ABNORMAL LOW (ref >60)
GFR calc non Af Amer: 48 mL/min — ABNORMAL LOW (ref >60)
Glucose, Bld: 132 mg/dL — ABNORMAL HIGH (ref 70–99)
Potassium: 2.7 mmol/L — CL (ref 3.5–5.1)
Sodium: 137 mmol/L (ref 135–145)

## 2018-06-14 LAB — CBC
HCT: 38.1 % (ref 36.0–46.0)
Hemoglobin: 11.9 g/dL — ABNORMAL LOW (ref 12.0–15.0)
MCH: 30.1 pg (ref 26.0–34.0)
MCHC: 31.2 g/dL (ref 30.0–36.0)
MCV: 96.5 fL (ref 80.0–100.0)
Platelets: 175 10*3/uL (ref 150–400)
RBC: 3.95 MIL/uL (ref 3.87–5.11)
RDW: 13.9 % (ref 11.5–15.5)
WBC: 10.1 10*3/uL (ref 4.0–10.5)
nRBC: 0 % (ref 0.0–0.2)

## 2018-06-14 LAB — TROPONIN I
Troponin I: 0.52 ng/mL (ref <0.03)
Troponin I: 0.3 ng/mL (ref <0.03)

## 2018-06-14 LAB — MAGNESIUM: Magnesium: 2.2 mg/dL (ref 1.7–2.4)

## 2018-06-14 MED ORDER — SODIUM CHLORIDE 0.9 % IV SOLN
INTRAVENOUS | Status: AC
  Administered 2018-06-14: 23:00:00 via INTRAVENOUS

## 2018-06-14 MED ORDER — ALBUTEROL SULFATE (2.5 MG/3ML) 0.083% IN NEBU
2.5000 mg | INHALATION_SOLUTION | Freq: Once | RESPIRATORY_TRACT | Status: AC
  Administered 2018-06-14: 2.5 mg via RESPIRATORY_TRACT

## 2018-06-14 MED ORDER — NITROGLYCERIN 0.4 MG SL SUBL
0.4000 mg | SUBLINGUAL_TABLET | SUBLINGUAL | Status: DC | PRN
  Administered 2018-06-15: 0.4 mg via SUBLINGUAL
  Filled 2018-06-14 (×2): qty 1

## 2018-06-14 MED ORDER — MORPHINE SULFATE (PF) 2 MG/ML IV SOLN
2.0000 mg | Freq: Once | INTRAVENOUS | Status: AC
  Administered 2018-06-14: 2 mg via INTRAVENOUS
  Filled 2018-06-14: qty 1

## 2018-06-14 MED ORDER — NICOTINE 21 MG/24HR TD PT24
21.0000 mg | MEDICATED_PATCH | Freq: Once | TRANSDERMAL | Status: AC
  Administered 2018-06-14: 21 mg via TRANSDERMAL
  Filled 2018-06-14: qty 1

## 2018-06-14 MED ORDER — AZITHROMYCIN 500 MG PO TABS
500.0000 mg | ORAL_TABLET | Freq: Every day | ORAL | Status: AC
  Administered 2018-06-15: 500 mg via ORAL
  Filled 2018-06-14: qty 1

## 2018-06-14 MED ORDER — OXYCODONE-ACETAMINOPHEN 5-325 MG PO TABS
1.0000 | ORAL_TABLET | Freq: Three times a day (TID) | ORAL | Status: DC | PRN
  Administered 2018-06-15: 1 via ORAL
  Filled 2018-06-14: qty 1

## 2018-06-14 MED ORDER — HEPARIN BOLUS VIA INFUSION
4000.0000 [IU] | Freq: Once | INTRAVENOUS | Status: AC
  Administered 2018-06-14: 4000 [IU] via INTRAVENOUS

## 2018-06-14 MED ORDER — IOPAMIDOL (ISOVUE-370) INJECTION 76%
100.0000 mL | Freq: Once | INTRAVENOUS | Status: AC | PRN
  Administered 2018-06-14: 81 mL via INTRAVENOUS

## 2018-06-14 MED ORDER — ASPIRIN EC 81 MG PO TBEC
81.0000 mg | DELAYED_RELEASE_TABLET | Freq: Every day | ORAL | Status: DC
  Administered 2018-06-15 – 2018-06-18 (×4): 81 mg via ORAL
  Filled 2018-06-14 (×4): qty 1

## 2018-06-14 MED ORDER — POTASSIUM CHLORIDE 10 MEQ/100ML IV SOLN
10.0000 meq | INTRAVENOUS | Status: AC
  Administered 2018-06-14 (×2): 10 meq via INTRAVENOUS
  Filled 2018-06-14: qty 100

## 2018-06-14 MED ORDER — POTASSIUM CHLORIDE CRYS ER 20 MEQ PO TBCR
60.0000 meq | EXTENDED_RELEASE_TABLET | Freq: Once | ORAL | Status: AC
  Administered 2018-06-14: 60 meq via ORAL
  Filled 2018-06-14: qty 3

## 2018-06-14 MED ORDER — MORPHINE SULFATE (PF) 4 MG/ML IV SOLN
4.0000 mg | Freq: Once | INTRAVENOUS | Status: AC
  Administered 2018-06-14: 4 mg via INTRAVENOUS
  Filled 2018-06-14: qty 1

## 2018-06-14 MED ORDER — POTASSIUM CHLORIDE CRYS ER 20 MEQ PO TBCR: 40.0000 meq | EXTENDED_RELEASE_TABLET | ORAL | Status: DC

## 2018-06-14 MED ORDER — PREDNISONE 20 MG PO TABS
40.0000 mg | ORAL_TABLET | Freq: Every day | ORAL | Status: DC
  Administered 2018-06-15: 40 mg via ORAL
  Filled 2018-06-14: qty 2

## 2018-06-14 MED ORDER — IPRATROPIUM-ALBUTEROL 0.5-2.5 (3) MG/3ML IN SOLN
3.0000 mL | Freq: Three times a day (TID) | RESPIRATORY_TRACT | Status: DC
  Administered 2018-06-15: 3 mL via RESPIRATORY_TRACT
  Filled 2018-06-14: qty 3

## 2018-06-14 MED ORDER — HEPARIN (PORCINE) 25000 UT/250ML-% IV SOLN
900.0000 [IU]/h | INTRAVENOUS | Status: DC
  Administered 2018-06-14: 900 [IU]/h via INTRAVENOUS
  Filled 2018-06-14: qty 250

## 2018-06-14 MED ORDER — METHYLPREDNISOLONE SODIUM SUCC 125 MG IJ SOLR
80.0000 mg | Freq: Once | INTRAMUSCULAR | Status: AC
  Administered 2018-06-14: 80 mg via INTRAVENOUS
  Filled 2018-06-14: qty 2

## 2018-06-14 MED ORDER — IPRATROPIUM-ALBUTEROL 0.5-2.5 (3) MG/3ML IN SOLN
3.0000 mL | Freq: Once | RESPIRATORY_TRACT | Status: AC
  Administered 2018-06-14: 3 mL via RESPIRATORY_TRACT

## 2018-06-14 MED ORDER — POTASSIUM CHLORIDE CRYS ER 20 MEQ PO TBCR
40.0000 meq | EXTENDED_RELEASE_TABLET | Freq: Every day | ORAL | Status: DC
  Administered 2018-06-15 – 2018-06-16 (×2): 40 meq via ORAL
  Filled 2018-06-14 (×2): qty 2

## 2018-06-14 MED ORDER — ALBUTEROL (5 MG/ML) CONTINUOUS INHALATION SOLN
10.0000 mg/h | INHALATION_SOLUTION | RESPIRATORY_TRACT | Status: DC
  Administered 2018-06-14: 10 mg/h via RESPIRATORY_TRACT
  Filled 2018-06-14: qty 20

## 2018-06-14 MED ORDER — ENOXAPARIN SODIUM 40 MG/0.4ML ~~LOC~~ SOLN
40.0000 mg | Freq: Every day | SUBCUTANEOUS | Status: DC
  Administered 2018-06-15: 40 mg via SUBCUTANEOUS
  Filled 2018-06-14 (×2): qty 0.4

## 2018-06-14 MED ORDER — ASPIRIN 81 MG PO CHEW
324.0000 mg | CHEWABLE_TABLET | Freq: Once | ORAL | Status: AC
  Administered 2018-06-14: 324 mg via ORAL
  Filled 2018-06-14: qty 4

## 2018-06-14 MED ORDER — AZITHROMYCIN 250 MG PO TABS
250.0000 mg | ORAL_TABLET | Freq: Every day | ORAL | Status: DC
  Administered 2018-06-15 – 2018-06-17 (×3): 250 mg via ORAL
  Filled 2018-06-14 (×3): qty 1

## 2018-06-14 NOTE — Progress Notes (Signed)
ANTICOAGULATION CONSULT NOTE - Initial Consult  Pharmacy Consult for heparin Indication: chest pain/ACS  Allergies  Allergen Reactions  . Fioricet [Butalbital-Apap-Caffeine] Other (See Comments)    "crazy in my head"  . Klonopin [Clonazepam] Other (See Comments)    "crazy in my head"  . Penicillins     Rash    Patient Measurements: Height: 5\' 7"  (170.2 cm) Weight: 175 lb (79.4 kg) IBW/kg (Calculated) : 61.6 Heparin Dosing Weight: 77.7 kg  Vital Signs: Temp: 98 F (36.7 C) (12/08 1526) Temp Source: Oral (12/08 1526) BP: 178/70 (12/08 1526) Pulse Rate: 87 (12/08 1526)  Labs: Recent Labs    06/14/18 1622  HGB 12.7  HCT 40.0  PLT 170  CREATININE 1.28*  TROPONINI 0.52*    Estimated Creatinine Clearance: 55.1 mL/min (A) (by C-G formula based on SCr of 1.28 mg/dL (H)).  Medical History: Past Medical History:  Diagnosis Date  . Bulging lumbar disc   . COPD (chronic obstructive pulmonary disease) (HCC)   . Hypertension   . Sciatica    Assessment: 52 yo F presents w/ flu like symptoms. Troponin found to be elevated. Pharmacy consulted for heparin. Hgb 12.7, plts wnl.  Goal of Therapy:  Heparin level 0.3-0.7 units/ml Monitor platelets by anticoagulation protocol: Yes   Plan:  Give heparin 4,000 unit bolus Start heparin gtt at 900 units/hr Monitor daily heparin level, CBC, s/s of bleed  [name] J 06/14/2018,6:27 PM

## 2018-06-14 NOTE — ED Notes (Signed)
Unsuccessful iv stick on Right Antecubital.

## 2018-06-14 NOTE — ED Provider Notes (Addendum)
[facility] EMERGENCY DEPARTMENT Provider Note   CSN: [id number] Arrival date & time: 06/14/18  1520     History   Chief Complaint Chief Complaint  Patient presents with  . Shortness of Breath    HPI Bethany Stuart is a 52 y.o. female.  HPI   52 year old female with dyspnea.  She has underlying COPD.  She is not on oxygen.  She reports that her breathing has been progressively worse over the past week or so.  The past few days she is felt congested and having body aches as well.  Subjective fevers.  She has had some right-sided chest soreness after a fall a couple weeks ago reports that this has been progressively improving.  Chronic back pain.  No sore throat.  No headache.  Past Medical History:  Diagnosis Date  . Bulging lumbar disc   . COPD (chronic obstructive pulmonary disease) (HCC)   . Hypertension   . Sciatica     There are no active problems to display for this patient.   Past Surgical History:  Procedure Laterality Date  . ABDOMINAL HYSTERECTOMY    . APPENDECTOMY    . CHOLECYSTECTOMY       OB History   None      Home Medications    Prior to Admission medications   Medication Sig Start Date End Date Taking? Authorizing Provider  acetaminophen (TYLENOL 8 HOUR) 650 MG CR tablet Take 1 tablet (650 mg total) by mouth every 8 (eight) hours. 04/12/18   Derwood Kaplan, MD  HYDROcodone-acetaminophen (NORCO/VICODIN) 5-325 MG tablet Take 1 tablet by mouth every 4 (four) hours as needed. 01/09/18   Elson Areas, PA-C  ibuprofen (ADVIL,MOTRIN) 600 MG tablet Take 1 tablet (600 mg total) by mouth every 6 (six) hours as needed. 04/12/18   Derwood Kaplan, MD  lidocaine (LIDODERM) 5 % Place 1 patch onto the skin daily. 04/12/18   Derwood Kaplan, MD  lisinopril (PRINIVIL,ZESTRIL) 20 MG tablet Take 20 mg by mouth daily.    [provider]  methocarbamol (ROBAXIN) 500 MG tablet Take 1 tablet (500 mg total) by mouth 2 (two) times daily. 01/09/18   Elson Areas, PA-C  oxyCODONE-acetaminophen (PERCOCET/ROXICET) 5-325 MG tablet Take 1 tablet by mouth every 12 (twelve) hours as needed for severe pain. 04/12/18   Derwood Kaplan, MD  potassium chloride (K-DUR) 10 MEQ tablet Take 1 tablet (10 mEq total) by mouth daily. 01/13/18   Rise Mu, PA-C  predniSONE (DELTASONE) 10 MG tablet 6,5,4,3,2,1 taper 04/12/18   Derwood Kaplan, MD    Family History No family history on file.  Social History Social History   Tobacco Use  . Smoking status: Current Every Day Smoker  . Smokeless tobacco: Never Used  Substance Use Topics  . Alcohol use: No  . Drug use: No     Allergies   Fioricet [butalbital-apap-caffeine]; Klonopin [clonazepam]; and Penicillins   Review of Systems Review of Systems  All systems reviewed and negative, other than as noted in HPI.  Physical Exam Updated Vital Signs BP (!) 178/70 (BP Location: Right Arm)   Pulse 87   Temp 98 F (36.7 C) (Oral)   Resp (!) 21   Ht 5\' 7"  (1.702 m)   Wt 79.4 kg   SpO2 100%   BMI 27.41 kg/m   Physical Exam  Constitutional: She appears well-developed and well-nourished. No distress.  HENT:  Head: Normocephalic and atraumatic.  Eyes: Conjunctivae are normal. Right eye exhibits no discharge.  Left eye exhibits no discharge.  Neck: Neck supple.  Cardiovascular: Normal rate, regular rhythm and normal heart sounds. Exam reveals no gallop and no friction rub.  No murmur heard. Pulmonary/Chest: Effort normal. No respiratory distress. She has wheezes.  Abdominal: Soft. She exhibits no distension. There is no tenderness.  Musculoskeletal: She exhibits no edema or tenderness.  Neurological: She is alert.  Skin: Skin is warm and dry.  Psychiatric: She has a normal mood and affect. Her behavior is normal. Thought content normal.  Nursing note and vitals reviewed.    ED Treatments / Results  Labs (all labs ordered are listed, but only abnormal results are displayed) Labs  Reviewed  CBC WITH DIFFERENTIAL/PLATELET - Abnormal; Notable for the following components:      Result Value   WBC 10.6 (*)    Monocytes Absolute 1.1 (*)    All other components within normal limits  BASIC METABOLIC PANEL - Abnormal; Notable for the following components:   Potassium 2.7 (*)    Glucose, Bld 132 (*)    BUN 35 (*)    Creatinine, Ser 1.28 (*)    GFR calc non Af Amer 48 (*)    GFR calc Af Amer 55 (*)    All other components within normal limits  TROPONIN I - Abnormal; Notable for the following components:   Troponin I 0.52 (*)    All other components within normal limits  D-DIMER, QUANTITATIVE (NOT AT [facility]) - Abnormal; Notable for the following components:   D-Dimer, Quant 0.52 (*)    All other components within normal limits  HEPARIN LEVEL (UNFRACTIONATED) - Abnormal; Notable for the following components:   Heparin Unfractionated <0.10 (*)    All other components within normal limits  TROPONIN I - Abnormal; Notable for the following components:   Troponin I 0.30 (*)    All other components within normal limits  CK - Abnormal; Notable for the following components:   Total CK 693 (*)    All other components within normal limits  BASIC METABOLIC PANEL - Abnormal; Notable for the following components:   Potassium 3.4 (*)    Glucose, Bld 195 (*)    BUN 31 (*)    Creatinine, Ser 1.22 (*)    GFR calc non Af Amer 51 (*)    GFR calc Af Amer 59 (*)    All other components within normal limits  CBC - Abnormal; Notable for the following components:   Hemoglobin 11.9 (*)    All other components within normal limits  LIPID PANEL - Abnormal; Notable for the following components:   Triglycerides 191 (*)    All other components within normal limits  TROPONIN I - Abnormal; Notable for the following components:   Troponin I 0.22 (*)    All other components within normal limits  BASIC METABOLIC PANEL - Abnormal; Notable for the following components:   CO2 21 (*)    Glucose,  Bld 197 (*)    BUN 28 (*)    Creatinine, Ser 1.27 (*)    GFR calc non Af Amer 48 (*)    GFR calc Af Amer 56 (*)    All other components within normal limits  CBC - Abnormal; Notable for the following components:   WBC 11.8 (*)    All other components within normal limits  BASIC METABOLIC PANEL - Abnormal; Notable for the following components:   Glucose, Bld 196 (*)    BUN 28 (*)    Creatinine, Ser 1.09 (*)  Calcium 8.8 (*)    GFR calc non Af Amer 58 (*)    All other components within normal limits  MAGNESIUM - Abnormal; Notable for the following components:   Magnesium 2.7 (*)    All other components within normal limits  CBC - Abnormal; Notable for the following components:   WBC 14.7 (*)    All other components within normal limits  BASIC METABOLIC PANEL - Abnormal; Notable for the following components:   Glucose, Bld 158 (*)    BUN 32 (*)    Creatinine, Ser 1.18 (*)    GFR calc non Af Amer 53 (*)    All other components within normal limits  CBC - Abnormal; Notable for the following components:   WBC 15.7 (*)    All other components within normal limits  BASIC METABOLIC PANEL - Abnormal; Notable for the following components:   Glucose, Bld 168 (*)    BUN 42 (*)    Creatinine, Ser 1.41 (*)    GFR calc non Af Amer 42 (*)    GFR calc Af Amer 49 (*)    All other components within normal limits  MRSA PCR SCREENING  MAGNESIUM  CBC  HIV ANTIBODY (ROUTINE TESTING W REFLEX)  MAGNESIUM  HEMOGLOBIN A1C  MAGNESIUM  MAGNESIUM  POCT ACTIVATED CLOTTING TIME    EKG EKG Interpretation  Date/Time:  Sunday June 14 2018 18:55:50 EST Ventricular Rate:  77 PR Interval:  142 QRS Duration: 96 QT Interval:  420 QTC Calculation: 476 R Axis:   73 Text Interpretation:  Sinus rhythm Anteroseptal infarct, old Repol abnrm suggests ischemia, diffuse leads Confirmed by Rolan Bucco [phone]) on 06/15/2018 9:54:45 AM   Radiology No results found.   Dg Chest 1 View  Result  Date: 06/17/2018 CLINICAL DATA:  Weakness and shortness of breath. Acute on chronic respiratory failure with hypoxia, COPD, current smoker. EXAM: CHEST  1 VIEW COMPARISON:  Chest x-ray and chest CT scan of June 14, 2018 FINDINGS: The lungs remain hyperinflated. There is increased density just above the right hemidiaphragm on today's study. The left lung is clear. The heart and pulmonary vascularity are normal. The mediastinum is normal in width. The trachea is midline. The bony thorax is unremarkable. IMPRESSION: Probable subsegmental atelectasis or developing pneumonia at the right lung base superimposed upon COPD. No CHF. Electronically Signed   By: David  [place] M.D.   On: 06/17/2018 12:07   Dg Chest 2 View  Result Date: 06/14/2018 CLINICAL DATA:  Shortness of breath EXAM: CHEST - 2 VIEW COMPARISON:  06/03/2018 FINDINGS: Lungs are hyperexpanded. Interstitial markings are diffusely coarsened with chronic features. The lungs are clear without focal pneumonia, edema, pneumothorax or pleural effusion. The cardiopericardial silhouette is within normal limits for size. The visualized bony structures of the thorax are intact. Telemetry leads overlie the chest. IMPRESSION: Hyperexpansion suggests emphysema. No acute cardiopulmonary findings. Electronically Signed   By: Kennith Center M.D.   On: 06/14/2018 18:14   Dg Ribs Unilateral W/chest Right  Result Date: 06/03/2018 CLINICAL DATA:  Fall with right rib pain. Initial encounter. EXAM: RIGHT RIBS AND CHEST - 3+ VIEW COMPARISON:  None. FINDINGS: No fracture or other bone lesions are seen involving the ribs. There is no evidence of pneumothorax or pleural effusion. Both lungs are clear. Hyperinflation heart size and mediastinal contours are within normal limits. IMPRESSION: No acute finding. Hyperinflation correlating with COPD history. Electronically Signed   By: Marnee Spring M.D.   On: 06/03/2018 08:36  Ct Angio Chest Pe W And/or Wo Contrast  Result  Date: 06/14/2018 CLINICAL DATA:  Shortness of breath. COPD. Central chest pain. Elevated D-dimer. EXAM: CT ANGIOGRAPHY CHEST WITH CONTRAST TECHNIQUE: Multidetector CT imaging of the chest was performed using the standard protocol during bolus administration of intravenous contrast. Multiplanar CT image reconstructions and MIPs were obtained to evaluate the vascular anatomy. CONTRAST:  81mL ISOVUE-370 IOPAMIDOL (ISOVUE-370) INJECTION 76% COMPARISON:  Chest radiographs obtained earlier today. Chest, abdomen and pelvis CTA dated 01/13/2017. FINDINGS: Cardiovascular: Normally opacified pulmonary arteries with no pulmonary arterial filling defects seen. Mild atheromatous arterial calcifications, including the thoracic aorta. Mediastinum/Nodes: No enlarged mediastinal, hilar, or axillary lymph nodes. Thyroid gland, trachea, and esophagus demonstrate no significant findings. Lungs/Pleura: Moderate right and mild-to-moderate left bullous changes. No lung nodules or pleural fluid. Upper Abdomen: Cholecystectomy clips. Musculoskeletal: Thoracic spine degenerative changes. Review of the MIP images confirms the above findings. IMPRESSION: 1. No pulmonary emboli or acute abnormality. 2. COPD. Aortic Atherosclerosis (ICD10-I70.0) and Emphysema (ICD10-J43.9). Electronically Signed   By: Beckie Salts M.D.   On: 06/14/2018 19:35   Ct Abdomen Pelvis W Contrast  Result Date: 06/03/2018 CLINICAL DATA:  Right upper quadrant abdominal pain after fall yesterday. EXAM: CT ABDOMEN AND PELVIS WITH CONTRAST TECHNIQUE: Multidetector CT imaging of the abdomen and pelvis was performed using the standard protocol following bolus administration of intravenous contrast. CONTRAST:  ISOVUE-300 IOPAMIDOL (ISOVUE-300) INJECTION 61% COMPARISON:  CT scan of January 12, 2018 and July 17, 2013. FINDINGS: Lower chest: No acute abnormality. Hepatobiliary: No focal liver abnormality is seen. Status post cholecystectomy. No biliary dilatation.  Pancreas: Unremarkable. No pancreatic ductal dilatation or surrounding inflammatory changes. Spleen: Normal in size without focal abnormality. Adrenals/Urinary Tract: Stable hyperplasia left adrenal gland is noted. Right adrenal gland appears normal. No hydronephrosis or renal obstruction is noted. No renal or ureteral calculi are noted. Urinary bladder is unremarkable. Stomach/Bowel: The stomach appears normal. There is no evidence of bowel obstruction or inflammation. Status post appendectomy. Vascular/Lymphatic: Aortic atherosclerosis. No enlarged abdominal or pelvic lymph nodes. Reproductive: Status post hysterectomy. No adnexal masses. Other: No abdominal wall hernia or abnormality. No abdominopelvic ascites. Musculoskeletal: Severe degenerative disc disease is noted at L3-4 and L4-5. No acute osseous abnormality is noted. IMPRESSION: No acute abnormality seen in the abdomen or pelvis. Stable multilevel degenerative disc disease is noted in lower lumbar spine. Aortic Atherosclerosis (ICD10-I70.0). Electronically Signed   By: Lupita Raider, M.D.   On: 06/03/2018 09:19    Procedures Procedures (including critical care time)  CRITICAL CARE Performed by: Raeford Razor Total critical care time: 35 minutes Critical care time was exclusive of separately billable procedures and treating other patients. Critical care was necessary to treat or prevent imminent or life-threatening deterioration. Critical care was time spent personally by me on the following activities: development of treatment plan with patient and/or surrogate as well as nursing, discussions with consultants, evaluation of patient's response to treatment, examination of patient, obtaining history from patient or surrogate, ordering and performing treatments and interventions, ordering and review of laboratory studies, ordering and review of radiographic studies, pulse oximetry and re-evaluation of patient's condition.  Medications Ordered  in ED Medications  potassium chloride 10 mEq in 100 mL IVPB (0 mEq Intravenous Stopping Infusion hung by another clincian 06/14/18 2325)  0.9 %  sodium chloride infusion ( Intravenous New Bag/Given 06/14/18 2328)  0.9% sodium chloride infusion (3 mL/kg/hr  88.6 kg Intravenous Rate/Dose Change 06/15/18 1446)  ipratropium-albuterol (DUONEB) 0.5-2.5 (3) MG/3ML  nebulizer solution 3 mL (3 mLs Nebulization Given 06/14/18 1536)  albuterol (PROVENTIL) (2.5 MG/3ML) 0.083% nebulizer solution 2.5 mg (2.5 mg Nebulization Given 06/14/18 1536)  potassium chloride SA (K-DUR,KLOR-CON) CR tablet 60 mEq (60 mEq Oral Given 06/14/18 1815)  iopamidol (ISOVUE-370) 76 % injection 100 mL (81 mLs Intravenous Contrast Given 06/14/18 1828)  heparin bolus via infusion 4,000 Units (4,000 Units Intravenous Bolus from Bag 06/14/18 1849)  morphine 4 MG/ML injection 4 mg (4 mg Intravenous Given 06/14/18 1908)  methylPREDNISolone sodium succinate (SOLU-MEDROL) 125 mg/2 mL injection 80 mg (80 mg Intravenous Given 06/14/18 1955)  aspirin chewable tablet 324 mg (324 mg Oral Given 06/14/18 2000)  nicotine (NICODERM CQ - dosed in mg/24 hours) patch 21 mg (21 mg Transdermal Patch Removed 06/15/18 2054)  morphine 2 MG/ML injection 2 mg (2 mg Intravenous Given 06/14/18 2341)  azithromycin (ZITHROMAX) tablet 500 mg (500 mg Oral Given 06/15/18 0010)  Influenza vac split quadrivalent PF (FLUARIX) injection 0.5 mL (0.5 mLs Intramuscular Given 06/16/18 1021)  pneumococcal 23 valent vaccine (PNU-IMMUNE) injection 0.5 mL (0.5 mLs Intramuscular Given 06/16/18 1023)  heparin bolus via infusion 2,000 Units (2,000 Units Intravenous Bolus from Bag 06/15/18 1055)  magnesium sulfate IVPB 4 g 100 mL (0 g Intravenous Stopped 06/15/18 1655)  aspirin chewable tablet 81 mg (0 mg Oral Duplicate 06/15/18 1445)  furosemide (LASIX) injection 40 mg (40 mg Intravenous Given 06/16/18 1626)  potassium chloride SA (K-DUR,KLOR-CON) CR tablet 20 mEq (20 mEq Oral Given 06/16/18  1626)     Initial Impression / Assessment and Plan / ED Course  I have reviewed the triage vital signs and the nursing notes.  Pertinent labs & imaging results that were available during my care of the patient were reviewed by me and considered in my medical decision making (see chart for details).     52yF with dyspnea. Suspect COPD exacerbation. Significant wheezing on exam. Improving with usual treatment. Some CP but confounded by recent fall with chest wall pain. Troponin is elevated. Admit for ongoing tx/evaluation.   Final Clinical Impressions(s) / ED Diagnoses   Final diagnoses:  Acute on chronic respiratory failure with hypoxia [facility])    ED Discharge Orders    None       Raeford Razor, MD 06/19/18 1648    Raeford Razor, MD 08/03/18 1125

## 2018-06-14 NOTE — ED Notes (Signed)
RT at bedside.

## 2018-06-14 NOTE — Plan of Care (Signed)
[facility] transfer discussed with Bethany RazorStephen Kohut, MD.  Bethany Stuart is a 52 year old female with pmh of COPD and HTN; who presents with SOB for the last few days and complaints of right-sided chest pain.  Patient of previous fall on the right side.  Vital signs relatively stable.  Labs reveal WBC 10.6, potassium 2.7, BUN 30, creatinine 1.28, troponin 0.52, and d-dimer 0.52.  EKG showing new ST wave changes giving cause for concern.  CT angiogram of the chest showed no signs of PE or infiltrate.  Patient was given 325 mg of aspirin to chew, 60 mEq of potassium chloride p.o., 30 mEq of potassium chloride IV, 125 mg of Solu-Medrol IV, started on a heparin drip per protocol, and duoneb breathing treatments.  Cardiology was called, but recommended formal consult by admitting team if warranted.

## 2018-06-14 NOTE — ED Notes (Signed)
Troponin 0.52 and K+ 2.7, results given to ED MD and Augusto GambleJerri' and Theora GianottiEliza

## 2018-06-14 NOTE — H&P (Signed)
History and Physical  Ashely Goosby ZOX:[id number] DOB: [birth year] DOA: 06/14/2018 1534  Referring physician: Marylen Ponto [facility]) PCP: System, Pcp Not In   HISTORY   Chief Complaint: wheezing and R sided chest pain  HPI: Bethany Stuart is a 52 y.o. female with lumbar degenerative disc disease, HTN, sciatica and chronic back pain (prescribed PO opioids), COPD, hx of smoking who presented to ED with dyspnea and R sided chest pain. She had a mechanical fall (frequent falls due to R sided sciatica) onto her right side and has reports some lower R flank/chest discomfort. For past few days reports progressive non productive cough, URI sx and increased wheezing not responsive to home inhalers. Also was experiencing substernal to right sided upper chest pain, episodic sometimes worse with breathing. Reports having prior episodes of exertional chest pain as twinges in the past. Upon arrival to [facility] she had ambulated to the bathroom and had recurrence of R sided chest pain after ambulating that was not associated with respiration.  No prior cardiac workup. Reports both mother and brother had CAD including MI < 55 years.    Review of Systems:  + dyspnea, cough, wheezing + chest pain episodes center to right sided both exertional and non exertional episodes - no fevers/chills - no edema, PND, orthopnea - no nausea/vomiting; no tarry, melanotic or bloody stools - no dysuria, increased urinary frequency - no weight changes Rest of systems reviewed are negative, except as per above history.   ED course:  Vitals Blood pressure (!) 160/74, pulse 80, temperature 98.5 F (36.9 C), temperature source Oral, resp. rate 20, height 5\' 7"  (1.702 m), weight 79.4 kg, SpO2 99 %. Received 325 mg of aspirin to chew, 60 mEq of potassium chloride p.o., 30 mEq of potassium chloride IV, 125 mg of Solu-Medrol IV, started on a heparin drip per protocol, and duoneb tx   Past Medical History:  Diagnosis Date  . Bulging lumbar  disc   . COPD (chronic obstructive pulmonary disease) (HCC)   . Hypertension   . Sciatica    Past Surgical History:  Procedure Laterality Date  . ABDOMINAL HYSTERECTOMY    . APPENDECTOMY    . CHOLECYSTECTOMY      Social History:  reports that she has been smoking. She has never used smokeless tobacco. She reports that she does not drink alcohol or use drugs.  Allergies  Allergen Reactions  . Fioricet [Butalbital-Apap-Caffeine] Other (See Comments)    "crazy in my head"  . Klonopin [Clonazepam] Other (See Comments)    "crazy in my head"  . Penicillins     Rash    No family history on file.    Prior to Admission medications   Medication Sig Start Date End Date Taking? Authorizing Provider  acetaminophen (TYLENOL 8 HOUR) 650 MG CR tablet Take 1 tablet (650 mg total) by mouth every 8 (eight) hours. 04/12/18   Derwood Kaplan, MD  HYDROcodone-acetaminophen (NORCO/VICODIN) 5-325 MG tablet Take 1 tablet by mouth every 4 (four) hours as needed. 01/09/18   Elson Areas, PA-C  ibuprofen (ADVIL,MOTRIN) 600 MG tablet Take 1 tablet (600 mg total) by mouth every 6 (six) hours as needed. 04/12/18   Derwood Kaplan, MD  lidocaine (LIDODERM) 5 % Place 1 patch onto the skin daily. 04/12/18   Derwood Kaplan, MD  lisinopril (PRINIVIL,ZESTRIL) 20 MG tablet Take 20 mg by mouth daily.    [provider]  methocarbamol (ROBAXIN) 500 MG tablet Take 1 tablet (500 mg total) by  mouth 2 (two) times daily. 01/09/18   Elson AreasSofia, Leslie K, PA-C  oxyCODONE-acetaminophen (PERCOCET/ROXICET) 5-325 MG tablet Take 1 tablet by mouth every 12 (twelve) hours as needed for severe pain. 04/12/18   Derwood KaplanNanavati, Ankit, MD  potassium chloride (K-DUR) 10 MEQ tablet Take 1 tablet (10 mEq total) by mouth daily. 01/13/18   Rise MuLeaphart, Kenneth T, PA-C  predniSONE (DELTASONE) 10 MG tablet 6,5,4,3,2,1 taper 04/12/18   Derwood KaplanNanavati, Ankit, MD    PHYSICAL EXAM   Temp:  [98 F (36.7 C)-98.5 F (36.9 C)] 98.5 F (36.9 C) (12/08  2229) Pulse Rate:  [72-87] 80 (12/08 2229) Cardiac Rhythm: Normal sinus rhythm (12/08 2240) Resp:  [13-21] 20 (12/08 2229) BP: (91-178)/(57-85) 160/74 (12/08 2229) SpO2:  [97 %-100 %] 99 % (12/08 2229) Weight:  [79.4 kg] 79.4 kg (12/08 1526)  BP (!) 160/74 (BP Location: Right Arm)   Pulse 80   Temp 98.5 F (36.9 C) (Oral)   Resp 20   Ht 5\' 7"  (1.702 m)   Wt 79.4 kg   SpO2 99%   BMI 27.41 kg/m    GEN morbidly obese middle-aged caucasian female; resting in bed, breathing mildly labored  HEENT NCAT EOM intact PERRL; clear oropharynx, no cervical LAD; dry mucus membranes  JVP estimated 5 cm H2O above RA; no HJR ; no carotid bruits b/l ;  CV regular normal rate; normal S1 and S2; no m/r/g or S3/S4; PMI non displaced; no parasternal heave  RESP diffuse expiratory wheezing throughout; coarse; breathing mildly labored  ABD soft NT ND +normoactive BS  EXT warm throughout b/l; no peripheral edema b/l  PULSES  DP and radials 2+ intact b/l  SKIN/MSK no rashes or lesions  NEURO/PSYCH AAOx4; no focal deficits   DATA   LABS ON ADMISSION:  Basic Metabolic Panel: Recent Labs  Lab 06/14/18 1622  NA 137  K 2.7*  CL 100  CO2 29  GLUCOSE 132*  BUN 35*  CREATININE 1.28*  CALCIUM 9.2  MG 2.2   CBC: Recent Labs  Lab 06/14/18 1622  WBC 10.6*  NEUTROABS 5.9  HGB 12.7  HCT 40.0  MCV 97.6  PLT 170   Liver Function Tests: No results for input(s): AST, ALT, ALKPHOS, BILITOT, PROT, ALBUMIN in the last 168 hours. No results for input(s): LIPASE, AMYLASE in the last 168 hours. No results for input(s): AMMONIA in the last 168 hours. Coagulation:  No results found for: INR, PROTIME No results found for: PTT Lactic Acid, Venous:  No results found for: LATICACIDVEN Cardiac Enzymes: Recent Labs  Lab 06/14/18 1622 06/14/18 2112  TROPONINI 0.52* 0.30*   Urinalysis:    Component Value Date/Time   COLORURINE STRAW (A) 01/09/2018 0025   APPEARANCEUR CLEAR 01/09/2018 0025    LABSPEC 1.009 01/09/2018 0025   PHURINE 5.0 01/09/2018 0025   GLUCOSEU NEGATIVE 01/09/2018 0025   HGBUR NEGATIVE 01/09/2018 0025   BILIRUBINUR NEGATIVE 01/09/2018 0025   KETONESUR NEGATIVE 01/09/2018 0025   PROTEINUR NEGATIVE 01/09/2018 0025   NITRITE NEGATIVE 01/09/2018 0025   LEUKOCYTESUR NEGATIVE 01/09/2018 0025    BNP (last 3 results) No results for input(s): PROBNP in the last 8760 hours. CBG: No results for input(s): GLUCAP in the last 168 hours.  Radiological Exams on Admission: Dg Chest 2 View  Result Date: 06/14/2018 CLINICAL DATA:  Shortness of breath EXAM: CHEST - 2 VIEW COMPARISON:  06/03/2018 FINDINGS: Lungs are hyperexpanded. Interstitial markings are diffusely coarsened with chronic features. The lungs are clear without focal pneumonia, edema, pneumothorax or pleural  effusion. The cardiopericardial silhouette is within normal limits for size. The visualized bony structures of the thorax are intact. Telemetry leads overlie the chest. IMPRESSION: Hyperexpansion suggests emphysema. No acute cardiopulmonary findings. Electronically Signed   By: Kennith Center M.D.   On: 06/14/2018 18:14   Ct Angio Chest Pe W And/or Wo Contrast  Result Date: 06/14/2018 CLINICAL DATA:  Shortness of breath. COPD. Central chest pain. Elevated D-dimer. EXAM: CT ANGIOGRAPHY CHEST WITH CONTRAST TECHNIQUE: Multidetector CT imaging of the chest was performed using the standard protocol during bolus administration of intravenous contrast. Multiplanar CT image reconstructions and MIPs were obtained to evaluate the vascular anatomy. CONTRAST:  81mL ISOVUE-370 IOPAMIDOL (ISOVUE-370) INJECTION 76% COMPARISON:  Chest radiographs obtained earlier today. Chest, abdomen and pelvis CTA dated 01/13/2017. FINDINGS: Cardiovascular: Normally opacified pulmonary arteries with no pulmonary arterial filling defects seen. Mild atheromatous arterial calcifications, including the thoracic aorta. Mediastinum/Nodes: No enlarged  mediastinal, hilar, or axillary lymph nodes. Thyroid gland, trachea, and esophagus demonstrate no significant findings. Lungs/Pleura: Moderate right and mild-to-moderate left bullous changes. No lung nodules or pleural fluid. Upper Abdomen: Cholecystectomy clips. Musculoskeletal: Thoracic spine degenerative changes. Review of the MIP images confirms the above findings. IMPRESSION: 1. No pulmonary emboli or acute abnormality. 2. COPD. Aortic Atherosclerosis (ICD10-I70.0) and Emphysema (ICD10-J43.9). Electronically Signed   By: Beckie Salts M.D.   On: 06/14/2018 19:35    EKG: Independently reviewed. EKG at 15:30 showed NSR with subtle ST depression at 1mm in inferior limb leads; repeat EKG at 18:55 with NSR show near resolution of ST depression   ASSESSMENT AND PLAN   Assessment: Bethany Stuart is a 52 y.o. female with lumbar degenerative disc disease, sciatica and chronic back pain (prescribed PO opioids), COPD, hx of smoking who presented with R sided chest pain and wheezing. Although patient clearly has COPD (emphysema) flare symptoms, her description of the chest pain also includes an episode of exertional angina as well as slight troponin elevation (peaked at 0.5). There does appear to be subtle dynamic ST depressions in inferior leads in the 2 EKGs obtained at outside ED. At this time, still pronounced resp wheezing, and also complicated by AKI (and also underwent CTA with contrast) and marked hypokalemia (acute on chronic). Overall does not fit ACS although she may likely have underlying CAD. Reasonable to discontinue heparin drip and then plan for cardiology consult. She is likely not an ideal candidate for cath tomorrow for the reasons above but may be appropriate per cardiology the following day. Patient does have + family CAD history.   Active Problems:   COPD exacerbation (HCC)   Chest pain  Plan:   # Chest pain with atypical and typical features in setting of COPD flare. Concern for demand  ischemia; less concerning for plaque rupture.  > started on ACS protocol with hep gtt and asa in ED prior to transfer > EKG showed subtle dynamic ST depressions in EKG in inferior limb leads > trop peaked at 0.52 --> 0.30 - discontinue heparin gtt - asa 81mg  daily start tomorrow - risk stratification with lipid panel and A1c pending - cardiology consult tomorrow re: ischemic evaluation when resp status improved - treat COPD flare and correct hypokalemia as below first - telemetry - repeat EKG in AM - TTE pending  # COPD exacerbation with wheezing on exam > emphyematous changes on CTA chest: no PE - continue duonebs - continue prednisone 40mg  x 5 days - azithro z-pak ordered (5 day course)  # AKI Cr 1.28 on  admission (likely pre-renal) and recent CTA w/contrast > baseline near 0.9 (note patient had prior excursions to 1.5)  - fluids at 125 cc/hr to end at 12 hours  - repeat BMP in AM  # Acute on chronic hypokalemia, pt reports intermittently taking K supplement > K 2.7 at OSH ED - s/p repletion with - ordered standing K daily - monitor lytes daily  # Chronic back pain 2/2 lumbar disc disease and sciatica - resume home PO oxycodone - follow as outpatient - PT/OT eval   # HTN on lisinopril at home  - hold lisinopril given AKI  DVT Prophylaxis: previously on heparin drip --> switching to sq lovenox Code Status:  Full Code Family Communication: patient at bedside Disposition Plan: admit to telemetry inpatient for chest pain workup   Patient contact: Extended Emergency Contact Information Primary Emergency Contact: ADAMS,DARWIN  [place] Mobile Phone: [phone] Relation: Other  Time spent: > 35 minutes  Ike Bene, MD [facility] Pager [phone]  If 7PM-7AM, please contact night-coverage [email or web address] Password [email or web address] 06/14/2018, 11:17 PM

## 2018-06-14 NOTE — ED Notes (Signed)
Patient transported to CT 

## 2018-06-14 NOTE — ED Notes (Signed)
[facility] arrived to transport pt to [facility] 

## 2018-06-14 NOTE — ED Notes (Signed)
Pt given TV dinner per request

## 2018-06-14 NOTE — ED Notes (Signed)
ED Provider at bedside. 

## 2018-06-14 NOTE — ED Notes (Signed)
[facility] notified (Tammy) - patient ready for transport 

## 2018-06-14 NOTE — ED Triage Notes (Signed)
Pt c/o for cold like symptoms for the past few days getting worse today with SOB and generalized body ache.

## 2018-06-15 ENCOUNTER — Encounter (HOSPITAL_COMMUNITY)
Admission: EM | Disposition: A | Discharge status: Home / Self Care | Payer: Self-pay | Source: Home / Self Care | Attending: Internal Medicine

## 2018-06-15 ENCOUNTER — Inpatient Hospital Stay (HOSPITAL_COMMUNITY): Payer: Medicaid Other

## 2018-06-15 ENCOUNTER — Other Ambulatory Visit: Payer: Self-pay

## 2018-06-15 DIAGNOSIS — J441 Chronic obstructive pulmonary disease with (acute) exacerbation: Secondary | ICD-10-CM

## 2018-06-15 DIAGNOSIS — E876 Hypokalemia: Secondary | ICD-10-CM

## 2018-06-15 DIAGNOSIS — R079 Chest pain, unspecified: Secondary | ICD-10-CM

## 2018-06-15 HISTORY — DX: Hypokalemia: E87.6

## 2018-06-15 HISTORY — PX: INTRAVASCULAR PRESSURE WIRE/FFR STUDY: CATH118243

## 2018-06-15 HISTORY — DX: Hypomagnesemia: E83.42

## 2018-06-15 HISTORY — PX: LEFT HEART CATH AND CORONARY ANGIOGRAPHY: CATH118249

## 2018-06-15 LAB — BASIC METABOLIC PANEL
Anion gap: 12 (ref 5–15)
Anion gap: 13 (ref 5–15)
BUN: 31 mg/dL — ABNORMAL HIGH (ref 6–20)
BUN: 28 mg/dL — ABNORMAL HIGH (ref 6–20)
CO2: 24 mmol/L (ref 22–32)
CO2: 21 mmol/L — ABNORMAL LOW (ref 22–32)
CREATININE: 1.22 mg/dL — AB (ref 0.44–1.00)
Calcium: 9 mg/dL (ref 8.9–10.3)
Calcium: 9.2 mg/dL (ref 8.9–10.3)
Chloride: 101 mmol/L (ref 98–111)
Chloride: 103 mmol/L (ref 98–111)
Creatinine, Ser: 1.27 mg/dL — ABNORMAL HIGH (ref 0.44–1.00)
GFR calc Af Amer: 56 mL/min — ABNORMAL LOW (ref >60)
GFR calc non Af Amer: 51 mL/min — ABNORMAL LOW (ref >60)
GFR calc non Af Amer: 48 mL/min — ABNORMAL LOW (ref >60)
GFR, EST AFRICAN AMERICAN: 59 mL/min — AB (ref >60)
Glucose, Bld: 195 mg/dL — ABNORMAL HIGH (ref 70–99)
Glucose, Bld: 197 mg/dL — ABNORMAL HIGH (ref 70–99)
Potassium: 3.4 mmol/L — ABNORMAL LOW (ref 3.5–5.1)
Potassium: 3.9 mmol/L (ref 3.5–5.1)
Sodium: 137 mmol/L (ref 135–145)
Sodium: 137 mmol/L (ref 135–145)

## 2018-06-15 LAB — CBC
HCT: 40.2 % (ref 36.0–46.0)
HEMOGLOBIN: 12.4 g/dL (ref 12.0–15.0)
MCH: 30 pg (ref 26.0–34.0)
MCHC: 30.8 g/dL (ref 30.0–36.0)
MCV: 97.3 fL (ref 80.0–100.0)
Platelets: 190 10*3/uL (ref 150–400)
RBC: 4.13 MIL/uL (ref 3.87–5.11)
RDW: 14.1 % (ref 11.5–15.5)
WBC: 6.8 10*3/uL (ref 4.0–10.5)
nRBC: 0 % (ref 0.0–0.2)

## 2018-06-15 LAB — LIPID PANEL
Cholesterol: 165 mg/dL (ref 0–200)
HDL: 64 mg/dL (ref >40)
LDL Cholesterol: 63 mg/dL (ref 0–99)
Total CHOL/HDL Ratio: 2.6 RATIO
Triglycerides: 191 mg/dL — ABNORMAL HIGH (ref <150)
VLDL: 38 mg/dL (ref 0–40)

## 2018-06-15 LAB — ECHOCARDIOGRAM COMPLETE
Height: 67 in
Weight: 3126.4 oz

## 2018-06-15 LAB — HIV ANTIBODY (ROUTINE TESTING W REFLEX): HIV Screen 4th Generation wRfx: NONREACTIVE

## 2018-06-15 LAB — CK: Total CK: 693 U/L — ABNORMAL HIGH (ref 38–234)

## 2018-06-15 LAB — HEMOGLOBIN A1C
Hgb A1c MFr Bld: 5.5 % (ref 4.8–5.6)
MEAN PLASMA GLUCOSE: 111.15 mg/dL

## 2018-06-15 LAB — MAGNESIUM: Magnesium: 1.8 mg/dL (ref 1.7–2.4)

## 2018-06-15 LAB — MRSA PCR SCREENING: MRSA by PCR: NEGATIVE

## 2018-06-15 LAB — HEPARIN LEVEL (UNFRACTIONATED): Heparin Unfractionated: <0.1 IU/mL — ABNORMAL LOW (ref 0.30–0.70)

## 2018-06-15 LAB — POCT ACTIVATED CLOTTING TIME: Activated Clotting Time: 274 seconds

## 2018-06-15 LAB — TROPONIN I: Troponin I: 0.22 ng/mL (ref <0.03)

## 2018-06-15 SURGERY — LEFT HEART CATH AND CORONARY ANGIOGRAPHY
Anesthesia: LOCAL

## 2018-06-15 MED ORDER — ASPIRIN 81 MG PO CHEW: 81.0000 mg | CHEWABLE_TABLET | ORAL | Status: AC

## 2018-06-15 MED ORDER — HEPARIN SODIUM (PORCINE) 1000 UNIT/ML IJ SOLN
INTRAMUSCULAR | Status: DC | PRN
  Administered 2018-06-15: 2000 [IU] via INTRAVENOUS
  Administered 2018-06-15 (×2): 5000 [IU] via INTRAVENOUS

## 2018-06-15 MED ORDER — IOHEXOL 350 MG/ML SOLN
INTRAVENOUS | Status: DC | PRN
  Administered 2018-06-15: 60 mL via INTRAVENOUS

## 2018-06-15 MED ORDER — VERAPAMIL HCL 2.5 MG/ML IV SOLN
INTRAVENOUS | Status: AC
  Filled 2018-06-15: qty 2

## 2018-06-15 MED ORDER — IPRATROPIUM-ALBUTEROL 0.5-2.5 (3) MG/3ML IN SOLN
3.0000 mL | Freq: Four times a day (QID) | RESPIRATORY_TRACT | Status: DC
  Administered 2018-06-15 – 2018-06-17 (×5): 3 mL via RESPIRATORY_TRACT
  Filled 2018-06-15 (×8): qty 3

## 2018-06-15 MED ORDER — SODIUM CHLORIDE 0.9 % IV SOLN: 250.0000 mL | INTRAVENOUS | Status: DC | PRN

## 2018-06-15 MED ORDER — SODIUM CHLORIDE 0.9% FLUSH: 3.0000 mL | INTRAVENOUS | Status: DC | PRN

## 2018-06-15 MED ORDER — PNEUMOCOCCAL VAC POLYVALENT 25 MCG/0.5ML IJ INJ
0.5000 mL | INJECTION | INTRAMUSCULAR | Status: AC
  Administered 2018-06-16: 0.5 mL via INTRAMUSCULAR
  Filled 2018-06-15: qty 0.5

## 2018-06-15 MED ORDER — AMLODIPINE BESYLATE 10 MG PO TABS
10.0000 mg | ORAL_TABLET | Freq: Every day | ORAL | Status: DC
  Administered 2018-06-16 – 2018-06-18 (×3): 10 mg via ORAL
  Filled 2018-06-15 (×3): qty 1

## 2018-06-15 MED ORDER — VERAPAMIL HCL 2.5 MG/ML IV SOLN
INTRAVENOUS | Status: DC | PRN
  Administered 2018-06-15: 10 mL via INTRA_ARTERIAL

## 2018-06-15 MED ORDER — NITROGLYCERIN 1 MG/10 ML FOR IR/CATH LAB
INTRA_ARTERIAL | Status: AC
  Filled 2018-06-15: qty 10

## 2018-06-15 MED ORDER — SODIUM CHLORIDE 0.9 % WEIGHT BASED INFUSION: 1.0000 mL/kg/h | INTRAVENOUS | Status: DC

## 2018-06-15 MED ORDER — NITROGLYCERIN IN D5W 200-5 MCG/ML-% IV SOLN
2.0000 ug/min | INTRAVENOUS | Status: DC
  Administered 2018-06-15: 5 ug/min via INTRAVENOUS
  Filled 2018-06-15: qty 250

## 2018-06-15 MED ORDER — CLOPIDOGREL BISULFATE 75 MG PO TABS
75.0000 mg | ORAL_TABLET | Freq: Every day | ORAL | Status: DC
  Administered 2018-06-16 – 2018-06-18 (×3): 75 mg via ORAL
  Filled 2018-06-15 (×3): qty 1

## 2018-06-15 MED ORDER — MIDAZOLAM HCL 2 MG/2ML IJ SOLN
INTRAMUSCULAR | Status: AC
  Filled 2018-06-15: qty 2

## 2018-06-15 MED ORDER — HEPARIN BOLUS VIA INFUSION
2000.0000 [IU] | Freq: Once | INTRAVENOUS | Status: AC
  Administered 2018-06-15: 2000 [IU] via INTRAVENOUS
  Filled 2018-06-15: qty 2000

## 2018-06-15 MED ORDER — INFLUENZA VAC SPLIT QUAD 0.5 ML IM SUSY
0.5000 mL | PREFILLED_SYRINGE | INTRAMUSCULAR | Status: AC
  Administered 2018-06-16: 0.5 mL via INTRAMUSCULAR
  Filled 2018-06-15: qty 0.5

## 2018-06-15 MED ORDER — SODIUM CHLORIDE 0.9 % IV SOLN
INTRAVENOUS | Status: DC
  Administered 2018-06-15: 11:00:00 via INTRAVENOUS

## 2018-06-15 MED ORDER — SODIUM CHLORIDE 0.9% FLUSH
3.0000 mL | Freq: Two times a day (BID) | INTRAVENOUS | Status: DC
  Administered 2018-06-15 – 2018-06-18 (×6): 3 mL via INTRAVENOUS

## 2018-06-15 MED ORDER — MIDAZOLAM HCL 2 MG/2ML IJ SOLN
INTRAMUSCULAR | Status: DC | PRN
  Administered 2018-06-15: 2 mg via INTRAVENOUS
  Administered 2018-06-15 (×2): 1 mg via INTRAVENOUS

## 2018-06-15 MED ORDER — NITROGLYCERIN 1 MG/10 ML FOR IR/CATH LAB
INTRA_ARTERIAL | Status: DC | PRN
  Administered 2018-06-15: 100 ug via INTRACORONARY

## 2018-06-15 MED ORDER — FENTANYL CITRATE (PF) 100 MCG/2ML IJ SOLN
INTRAMUSCULAR | Status: DC | PRN
  Administered 2018-06-15: 50 ug via INTRAVENOUS
  Administered 2018-06-15 (×2): 25 ug via INTRAVENOUS

## 2018-06-15 MED ORDER — ROSUVASTATIN CALCIUM 20 MG PO TABS
20.0000 mg | ORAL_TABLET | Freq: Every day | ORAL | Status: DC
  Administered 2018-06-15 – 2018-06-17 (×3): 20 mg via ORAL
  Filled 2018-06-15 (×3): qty 1

## 2018-06-15 MED ORDER — METHYLPREDNISOLONE SODIUM SUCC 125 MG IJ SOLR
60.0000 mg | Freq: Four times a day (QID) | INTRAMUSCULAR | Status: DC
  Administered 2018-06-15 – 2018-06-18 (×13): 60 mg via INTRAVENOUS
  Filled 2018-06-15 (×13): qty 2

## 2018-06-15 MED ORDER — ONDANSETRON HCL 4 MG/2ML IJ SOLN: 4.0000 mg | Freq: Four times a day (QID) | INTRAMUSCULAR | Status: DC | PRN

## 2018-06-15 MED ORDER — LIDOCAINE HCL (PF) 1 % IJ SOLN
INTRAMUSCULAR | Status: DC | PRN
  Administered 2018-06-15: 2 mL

## 2018-06-15 MED ORDER — AMLODIPINE BESYLATE 2.5 MG PO TABS: 5.0000 mg | ORAL_TABLET | Freq: Every day | ORAL | Status: DC

## 2018-06-15 MED ORDER — OXYCODONE-ACETAMINOPHEN 5-325 MG PO TABS
2.0000 | ORAL_TABLET | Freq: Three times a day (TID) | ORAL | Status: DC | PRN
  Administered 2018-06-15 – 2018-06-18 (×10): 2 via ORAL
  Filled 2018-06-15 (×10): qty 2

## 2018-06-15 MED ORDER — SODIUM CHLORIDE 0.9% FLUSH: 3.0000 mL | Freq: Two times a day (BID) | INTRAVENOUS | Status: DC

## 2018-06-15 MED ORDER — HEPARIN (PORCINE) 25000 UT/250ML-% IV SOLN
900.0000 [IU]/h | INTRAVENOUS | Status: DC
  Administered 2018-06-15: 900 [IU]/h via INTRAVENOUS
  Filled 2018-06-15: qty 250

## 2018-06-15 MED ORDER — ACETAMINOPHEN 325 MG PO TABS: 650.0000 mg | ORAL_TABLET | ORAL | Status: DC | PRN

## 2018-06-15 MED ORDER — SODIUM CHLORIDE 0.9 % IV SOLN: INTRAVENOUS | Status: DC

## 2018-06-15 MED ORDER — SODIUM CHLORIDE 0.9 % WEIGHT BASED INFUSION: 3.0000 mL/kg/h | INTRAVENOUS | Status: DC

## 2018-06-15 MED ORDER — SODIUM CHLORIDE 0.9 % WEIGHT BASED INFUSION: 3.0000 mL/kg/h | INTRAVENOUS | Status: AC

## 2018-06-15 MED ORDER — ASPIRIN 81 MG PO CHEW: 81.0000 mg | CHEWABLE_TABLET | ORAL | Status: DC

## 2018-06-15 MED ORDER — MAGNESIUM SULFATE 4 GM/100ML IV SOLN
4.0000 g | Freq: Once | INTRAVENOUS | Status: AC
  Administered 2018-06-15: 4 g via INTRAVENOUS
  Filled 2018-06-15 (×2): qty 100

## 2018-06-15 MED ORDER — HEPARIN SODIUM (PORCINE) 1000 UNIT/ML IJ SOLN
INTRAMUSCULAR | Status: AC
  Filled 2018-06-15: qty 1

## 2018-06-15 MED ORDER — FENTANYL CITRATE (PF) 100 MCG/2ML IJ SOLN
INTRAMUSCULAR | Status: AC
  Filled 2018-06-15: qty 2

## 2018-06-15 MED ORDER — HEPARIN (PORCINE) IN NACL 1000-0.9 UT/500ML-% IV SOLN
INTRAVENOUS | Status: DC | PRN
  Administered 2018-06-15 (×2): 500 mL

## 2018-06-15 MED ORDER — NICOTINE 21 MG/24HR TD PT24
21.0000 mg | MEDICATED_PATCH | Freq: Every day | TRANSDERMAL | Status: DC
  Administered 2018-06-15 – 2018-06-18 (×4): 21 mg via TRANSDERMAL
  Filled 2018-06-15 (×4): qty 1

## 2018-06-15 MED ORDER — HEPARIN (PORCINE) IN NACL 1000-0.9 UT/500ML-% IV SOLN
INTRAVENOUS | Status: AC
  Filled 2018-06-15: qty 1000

## 2018-06-15 MED ORDER — ALBUTEROL SULFATE (2.5 MG/3ML) 0.083% IN NEBU
2.5000 mg | INHALATION_SOLUTION | RESPIRATORY_TRACT | Status: DC | PRN
  Administered 2018-06-16: 2.5 mg via RESPIRATORY_TRACT
  Filled 2018-06-15: qty 3

## 2018-06-15 MED ORDER — LIDOCAINE HCL (PF) 1 % IJ SOLN
INTRAMUSCULAR | Status: AC
  Filled 2018-06-15: qty 30

## 2018-06-15 SURGICAL SUPPLY — 13 items
CATH OPTITORQUE TIG 4.0 5F (CATHETERS) ×2 IMPLANT
CATH VISTA GUIDE 6FR JR4 (CATHETERS) ×2 IMPLANT
DEVICE RAD COMP TR BAND LRG (VASCULAR PRODUCTS) ×2 IMPLANT
GLIDESHEATH SLEND A-KIT 6F 22G (SHEATH) ×2 IMPLANT
GUIDEWIRE INQWIRE 1.5J.035X260 (WIRE) ×1 IMPLANT
GUIDEWIRE PRESSURE COMET II (WIRE) ×2 IMPLANT
INQWIRE 1.5J .035X260CM (WIRE) ×2
KIT ESSENTIALS PG (KITS) ×2 IMPLANT
KIT HEART LEFT (KITS) ×2 IMPLANT
PACK CARDIAC CATHETERIZATION (CUSTOM PROCEDURE TRAY) ×2 IMPLANT
SHEATH PROBE COVER 6X72 (BAG) IMPLANT
TRANSDUCER W/STOPCOCK (MISCELLANEOUS) ×2 IMPLANT
TUBING CIL FLEX 10 FLL-RA (TUBING) ×2 IMPLANT

## 2018-06-15 NOTE — Consult Note (Addendum)
Reason for Consult: Chest pain Referring Physician: [facility]  Bethany Stuart is an 52 y.o. female.  HPI:   52-year-old Caucasian female with hypertension, 50-pack-year smoker, COPD, family history of coronary artery disease, presented to [facility] on 12/8 2019 with complaints of shortness of breath and wheezing  patient reports pleuritic bilateral chest pain. Workup showed elevated troponin of 0.52 ng/mL. She was transferred to [place] for further management. Cardiology consulted.  On my interview a today, patient was chest pain-free.  She does have chest pain on ambulation, as well as with deep breathing.  Creatinine increased from baseline of normal to 1.28.  Past Medical History:  Diagnosis Date  . Bulging lumbar disc   . COPD (chronic obstructive pulmonary disease) (HCC)   . Hypertension   . Sciatica     Past Surgical History:  Procedure Laterality Date  . ABDOMINAL HYSTERECTOMY    . APPENDECTOMY    . CHOLECYSTECTOMY      No family history on file.  Social History:  reports that she has been smoking. She has never used smokeless tobacco. She reports that she does not drink alcohol or use drugs.  Allergies:  Allergies  Allergen Reactions  . Fioricet [Butalbital-Apap-Caffeine] Other (See Comments)    "crazy in my head"  . Klonopin [Clonazepam] Other (See Comments)    "crazy in my head"  . Penicillins Rash    Has patient had a PCN reaction causing immediate rash, facial/tongue/throat swelling, SOB or lightheadedness with hypotension: Yes Has patient had a PCN reaction causing severe rash involving mucus membranes or skin necrosis: No Has patient had a PCN reaction that required hospitalization: No Has patient had a PCN reaction occurring within the last 10 years: No If all of the above answers are "NO", then may proceed with Cephalosporin use.     Medications: I have reviewed the patient's current medications.  Results for orders placed or  performed during the hospital encounter of 06/14/18 (from the past 48 hour(s))  CBC with Differential     Status: Abnormal   Collection Time: 06/14/18  4:22 PM  Result Value Ref Range   WBC 10.6 (H) 4.0 - 10.5 K/uL   RBC 4.10 3.87 - 5.11 MIL/uL   Hemoglobin 12.7 12.0 - 15.0 g/dL   HCT 40.0 36.0 - 46.0 %   MCV 97.6 80.0 - 100.0 fL   MCH 31.0 26.0 - 34.0 pg   MCHC 31.8 30.0 - 36.0 g/dL   RDW 13.9 11.5 - 15.5 %   Platelets 170 150 - 400 K/uL   nRBC 0.0 0.0 - 0.2 %   Neutrophils Relative % 56 %   Neutro Abs 5.9 1.7 - 7.7 K/uL   Lymphocytes Relative 31 %   Lymphs Abs 3.3 0.7 - 4.0 K/uL   Monocytes Relative 10 %   Monocytes Absolute 1.1 (H) 0.1 - 1.0 K/uL   Eosinophils Relative 2 %   Eosinophils Absolute 0.2 0.0 - 0.5 K/uL   Basophils Relative 0 %   Basophils Absolute 0.0 0.0 - 0.1 K/uL   Immature Granulocytes 1 %   Abs Immature Granulocytes 0.06 0.00 - 0.07 K/uL    Comment: Performed at [facility] [place], [address] [place], [place] [address]  Basic metabolic panel     Status: Abnormal   Collection Time: 06/14/18  4:22 PM  Result Value Ref Range   Sodium 137 135 - 145 mmol/L   Potassium 2.7 (LL) 3.5 - 5.1 mmol/L      Comment: CRITICAL RESULT CALLED TO, READ BACK BY AND VERIFIED WITH: GOUGE S RN [id number] PHILLIPS C    Chloride 100 98 - 111 mmol/L   CO2 29 22 - 32 mmol/L   Glucose, Bld 132 (H) 70 - 99 mg/dL   BUN 35 (H) 6 - 20 mg/dL   Creatinine, Ser 1.28 (H) 0.44 - 1.00 mg/dL   Calcium 9.2 8.9 - 10.3 mg/dL   GFR calc non Af Amer 48 (L) >60 mL/min   GFR calc Af Amer 55 (L) >60 mL/min   Anion gap 8 5 - 15    Comment: Performed at [facility] [place], [address] [place], [place] [address]  Troponin I - ONCE - STAT     Status: Abnormal   Collection Time: 06/14/18  4:22 PM  Result Value Ref Range   Troponin I 0.52 (HH) <0.03 ng/mL    Comment: CRITICAL RESULT CALLED TO, READ BACK BY AND VERIFIED WITH: GOUSE S RN [id number] PHILLIPS C Performed at [facility] [place], [address] [place], [place] [address]   D-dimer, quantitative (not at [facility])     Status: Abnormal   Collection Time: 06/14/18  4:22 PM  Result Value Ref Range   D-Dimer, Quant 0.52 (H) 0.00 - 0.50 ug/mL-FEU    Comment: (NOTE) At the manufacturer cut-off of 0.50 ug/mL FEU, this assay has been documented to exclude PE with a sensitivity and negative predictive value of 97 to 99%.  At this time, this assay has not been approved by the FDA to exclude DVT/VTE. Results should be correlated with clinical presentation. Performed at [facility] [place], [address] [place], [place] [address]   Magnesium     Status: None   Collection Time: 06/14/18  4:22 PM  Result Value Ref Range   Magnesium 2.2 1.7 - 2.4 mg/dL    Comment: Performed at [facility] [place], [address] [place], [place] [address]  Troponin I - ONCE - STAT     Status: Abnormal   Collection Time: 06/14/18  9:12 PM  Result Value Ref Range   Troponin I 0.30 (HH) <0.03 ng/mL    Comment: CRITICAL VALUE NOTED.  VALUE IS CONSISTENT WITH PREVIOUSLY REPORTED AND CALLED VALUE. Performed at [facility] [place], [address] [place], [place] [address]   CK     Status: Abnormal   Collection Time: 06/14/18 11:14 PM  Result Value Ref Range   Total CK 693 (H) 38 - 234 U/L    Comment: Performed at [facility], [address] [place], [place] [address]  Basic metabolic panel     Status: Abnormal   Collection Time: 06/14/18 11:26 PM  Result Value Ref Range   Sodium 137 135 - 145 mmol/L   Potassium 3.4 (L) 3.5 - 5.1 mmol/L   Chloride 101 98 - 111 mmol/L   CO2 24 22 - 32 mmol/L   Glucose, Bld 195 (H) 70 - 99 mg/dL   BUN 31 (H) 6 - 20 mg/dL   Creatinine, Ser 1.22 (H) 0.44 - 1.00 mg/dL   Calcium 9.0 8.9 - 10.3 mg/dL   GFR calc non Af Amer 51 (L) >60 mL/min   GFR calc Af Amer 59 (L) >60 mL/min   Anion gap 12 5 - 15    Comment: Performed at [facility], [address]  [place], [place] [address]  CBC       Status: Abnormal   Collection Time: 06/14/18 11:26 PM  Result Value Ref Range   WBC 10.1 4.0 - 10.5 K/uL   RBC 3.95 3.87 - 5.11 MIL/uL   Hemoglobin 11.9 (L) 12.0 - 15.0 g/dL   HCT 38.1 36.0 - 46.0 %   MCV 96.5 80.0 - 100.0 fL   MCH 30.1 26.0 - 34.0 pg   MCHC 31.2 30.0 - 36.0 g/dL   RDW 13.9 11.5 - 15.5 %   Platelets 175 150 - 400 K/uL   nRBC 0.0 0.0 - 0.2 %    Comment: Performed at [facility], [address] [place], [place] [address]  Magnesium     Status: None   Collection Time: 06/14/18 11:26 PM  Result Value Ref Range   Magnesium 1.8 1.7 - 2.4 mg/dL    Comment: Performed at [facility], [address] [place], [place] [address]  Lipid panel     Status: Abnormal   Collection Time: 06/14/18 11:26 PM  Result Value Ref Range   Cholesterol 165 0 - 200 mg/dL   Triglycerides 191 (H) <150 mg/dL   HDL 64 >40 mg/dL   Total CHOL/HDL Ratio 2.6 RATIO   VLDL 38 0 - 40 mg/dL   LDL Cholesterol 63 0 - 99 mg/dL    Comment:        Total Cholesterol/HDL:CHD Risk Coronary Heart Disease Risk Table                     Men   Women  1/2 Average Risk   3.4   3.3  Average Risk       5.0   4.4  2 X Average Risk   9.6   7.1  3 X Average Risk  23.4   11.0        Use the calculated Patient Ratio above and the CHD Risk Table to determine the patient's CHD Risk.        ATP III CLASSIFICATION (LDL):  <100     mg/dL   Optimal  100-129  mg/dL   Near or Above                    Optimal  130-159  mg/dL   Borderline  160-189  mg/dL   High  >190     mg/dL   Very High Performed at [facility], [address] [place], [place] [address]   Hemoglobin A1c     Status: None   Collection Time: 06/14/18 11:26 PM  Result Value Ref Range   Hgb A1c MFr Bld 5.5 4.8 - 5.6 %    Comment: (NOTE) Pre diabetes:          5.7%-6.4% Diabetes:              >6.4% Glycemic control for   <7.0% adults with diabetes    Mean Plasma Glucose 111.15 mg/dL    Comment:  Performed at  [facility], [address] , [place] [address]  CBC     Status: None   Collection Time: 06/15/18  5:20 AM  Result Value Ref Range   WBC 6.8 4.0 - 10.5 K/uL   RBC 4.13 3.87 - 5.11 MIL/uL   Hemoglobin 12.4 12.0 - 15.0 g/dL   HCT 40.2 36.0 - 46.0 %   MCV 97.3 80.0 - 100.0 fL   MCH 30.0 26.0 - 34.0 pg   MCHC 30.8 30.0 - 36.0 g/dL     RDW 14.1 11.5 - 15.5 %   Platelets 190 150 - 400 K/uL   nRBC 0.0 0.0 - 0.2 %    Comment: Performed at [facility], [address] [place], [place] [address]  Heparin level (unfractionated)     Status: Abnormal   Collection Time: 06/15/18  5:20 AM  Result Value Ref Range   Heparin Unfractionated <0.10 (L) 0.30 - 0.70 IU/mL    Comment: (NOTE) If heparin results are below expected values, and patient dosage has  been confirmed, suggest follow up testing of antithrombin III levels. Performed at [facility], [address] [place], [place] [address]     Dg Chest 2 View  Result Date: 06/14/2018 CLINICAL DATA:  Shortness of breath EXAM: CHEST - 2 VIEW COMPARISON:  06/03/2018 FINDINGS: Lungs are hyperexpanded. Interstitial markings are diffusely coarsened with chronic features. The lungs are clear without focal pneumonia, edema, pneumothorax or pleural effusion. The cardiopericardial silhouette is within normal limits for size. The visualized bony structures of the thorax are intact. Telemetry leads overlie the chest. IMPRESSION: Hyperexpansion suggests emphysema. No acute cardiopulmonary findings. Electronically Signed   By: Eric  Mansell M.D.   On: 06/14/2018 18:14   Ct Angio Chest Pe W And/or Wo Contrast  Result Date: 06/14/2018 CLINICAL DATA:  Shortness of breath. COPD. Central chest pain. Elevated D-dimer. EXAM: CT ANGIOGRAPHY CHEST WITH CONTRAST TECHNIQUE: Multidetector CT imaging of the chest was performed using the standard protocol during bolus administration of intravenous contrast. Multiplanar CT image  reconstructions and MIPs were obtained to evaluate the vascular anatomy. CONTRAST:  81mL ISOVUE-370 IOPAMIDOL (ISOVUE-370) INJECTION 76% COMPARISON:  Chest radiographs obtained earlier today. Chest, abdomen and pelvis CTA dated 01/13/2017. FINDINGS: Cardiovascular: Normally opacified pulmonary arteries with no pulmonary arterial filling defects seen. Mild atheromatous arterial calcifications, including the thoracic aorta. Mediastinum/Nodes: No enlarged mediastinal, hilar, or axillary lymph nodes. Thyroid gland, trachea, and esophagus demonstrate no significant findings. Lungs/Pleura: Moderate right and mild-to-moderate left bullous changes. No lung nodules or pleural fluid. Upper Abdomen: Cholecystectomy clips. Musculoskeletal: Thoracic spine degenerative changes. Review of the MIP images confirms the above findings. IMPRESSION: 1. No pulmonary emboli or acute abnormality. 2. COPD. Aortic Atherosclerosis (ICD10-I70.0) and Emphysema (ICD10-J43.9). Electronically Signed   By: Steven  Reid M.D.   On: 06/14/2018 19:35    Cardiac studies: EKG 06/09/2018: Sinus rhythm.  Normal axis.  Normal conduction.  Minimal ST elevation in lead V1, V2 not eating STEMI criteria.  Nonspecific inferolateral ST-T changes  Echocardiogram 06/15/2018:  - Left ventricle: The cavity size was normal. Wall thickness was   normal. Systolic function was normal. The estimated ejection   fraction was in the range of 55% to 65%. Wall motion was normal;   there were no regional wall motion abnormalities. Doppler   parameters are consistent with abnormal left ventricular   relaxation (grade 1 diastolic dysfunction). - No significant valvular abnormality. - No evidence of pulmonary hypertension.  Review of Systems  Constitutional: Negative.   HENT: Negative.   Eyes: Negative.   Respiratory: Positive for cough, shortness of breath and wheezing.   Cardiovascular: Positive for chest pain. Negative for orthopnea and claudication.   Gastrointestinal: Negative.   Genitourinary: Negative.   Musculoskeletal: Negative.   Neurological: Negative for dizziness and loss of consciousness.  Endo/Heme/Allergies: Does not bruise/bleed easily.  Psychiatric/Behavioral: Negative.   All other systems reviewed and are negative.  Blood pressure (!) 157/84, pulse 87, temperature 98.7 F (37.1 C), temperature source Oral, resp. rate 18,   height 5' 7" (1.702 m), weight 88.6 kg, SpO2 97 %. Physical Exam  Nursing note and vitals reviewed. Constitutional: She is oriented to person, place, and time. She appears well-developed and well-nourished. No distress.  HENT:  Head: Normocephalic and atraumatic.  Eyes: Pupils are equal, round, and reactive to light. Conjunctivae are normal.  Neck: No JVD present.  Cardiovascular: Normal rate and regular rhythm.  No murmur heard. Pulses:      Dorsalis pedis pulses are 1+ on the right side, and 1+ on the left side.       Posterior tibial pulses are 1+ on the right side, and 1+ on the left side.  Respiratory: Effort normal. She has wheezes.  GI: Soft. Bowel sounds are normal. There is no tenderness. There is no rebound.  Musculoskeletal: She exhibits edema (Trace b/l).  Neurological: She is alert and oriented to person, place, and time. She has normal reflexes.  Skin: Skin is warm and dry.  Psychiatric: She has a normal mood and affect.    Assessment/Recommendations:  52-year-old Caucasian female with hypertension, 50-pack-year smoker, COPD, family history of coronary artery disease, now admitted with COPD exacerbation and NSTEMI.  NSTEMI: Chest pain free on my interview earlier today.  Patient did have one episode of chest pain responding to sublingual nitroglycerin.  Recommend aspirin, heparin, high-intensity statin, IV nitroglycerin drip.  Recommend IV hydration given her mild acute kidney injury with plans for cardiac catheterization tomorrow on 04/16/2018.  If patient has recurrent chest pain  not controlled with nitroglycerin, or develops any hemodynamic or electrical instability, will proceed with urgent catheterization today.  Hypertension: On lisinopril 20 mg daily at baseline.  Currently held due to acute kidney injury.  Recommend amlodipine 5 mg daily for pressure control.  Ideally, would like to start beta blocker.  However, given her severe wheezing, unable to do so.  Had hydralazine IV p.r.n. for adequate blood pressure control  COPD exacerbation: Patient has bilateral active wheezing.  Management as per primary team.  Tobacco abuse: Emphasized cessation. On nicotine patch     Bethany Stuart 06/15/2018, 11:51 AM   Bethany Koelling J Fount Bahe, MD [facility] Pager: [phone] Office: [phone] If no answer Cell [phone]    

## 2018-06-15 NOTE — Progress Notes (Signed)
  Echocardiogram 2D Echocardiogram has been performed.  Bethany Stuart 06/15/2018, 11:14 AM

## 2018-06-15 NOTE — Progress Notes (Signed)
ANTICOAGULATION CONSULT NOTE   Pharmacy Consult for heparin Indication: chest pain/ACS  Allergies  Allergen Reactions  . Fioricet [Butalbital-Apap-Caffeine] Other (See Comments)    "crazy in my head"  . Klonopin [Clonazepam] Other (See Comments)    "crazy in my head"  . Penicillins     Rash    Patient Measurements: Height: 5\' 7"  (170.2 cm) Weight: 195 lb 6.4 oz (88.6 kg) IBW/kg (Calculated) : 61.6 Heparin Dosing Weight: 77.7 kg  Vital Signs: Temp: 98.5 F (36.9 C) (12/09 0401) Temp Source: Oral (12/09 0401) BP: 155/85 (12/09 0401) Pulse Rate: 79 (12/09 0826)  Labs: Recent Labs    06/14/18 1622 06/14/18 2112 06/14/18 2314 06/14/18 2326 06/15/18 0520  HGB 12.7  --   --  11.9* 12.4  HCT 40.0  --   --  38.1 40.2  PLT 170  --   --  175 190  HEPARINUNFRC  --   --   --   --  <0.10*  CREATININE 1.28*  --   --  1.22*  --   CKTOTAL  --   --  693*  --   --   TROPONINI 0.52* 0.30*  --   --   --     Estimated Creatinine Clearance: 61 mL/min (A) (by C-G formula based on SCr of 1.22 mg/dL (H)).   Assessment: 52 yo F presents w/ flu like symptoms. Troponin found to be elevated. Pharmacy consulted for heparin 12/8 - heparin was d/c later that day.  Now heparin to be resumed 12/9 for r/o ACS.  Lovenox 40mg  SQ given 12/9 0845  Goal of Therapy:  Heparin level 0.3-0.7 units/ml Monitor platelets by anticoagulation protocol: Yes   Plan:  Heparin 2,000 unit bolus (1/2 bolus with Lovenox just given) Start heparin gtt at 900 units/hr F/u 6 hr heparin level Monitor daily heparin level, CBC, s/s of bleed  Christoper Fabianaron Sebastin Perlmutter, PharmD, BCPS Clinical pharmacist  **Pharmacist phone directory can now be found on [email or web address] ([email or web address]).  Listed under [facility]. 06/15/2018,9:43 AM

## 2018-06-15 NOTE — Progress Notes (Signed)
Patient arrived to 3E02 by EMS. Patient A&Ox4. Complain of right side pain. SR on telemetry. VSS. On room air. No skin breakdown. Patient oriented to room and call system. Gen plan of care initiated.

## 2018-06-15 NOTE — H&P (View-Only) (Signed)
Reason for Consult: Chest pain Referring Physician: [facility]  Bethany Stuart is an 52 y.o. female.  HPI:   52 year old Caucasian female with hypertension, 50-pack-year smoker, COPD, family history of coronary artery disease, presented to [facility] on 12/8 2019 with complaints of shortness of breath and wheezing  patient reports pleuritic bilateral chest pain. Workup showed elevated troponin of 0.52 ng/mL. She was transferred to [facility] for further management. Cardiology consulted.  On my interview a today, patient was chest pain-free.  She does have chest pain on ambulation, as well as with deep breathing.  Creatinine increased from baseline of normal to 1.28.  Past Medical History:  Diagnosis Date  . Bulging lumbar disc   . COPD (chronic obstructive pulmonary disease) (HCC)   . Hypertension   . Sciatica     Past Surgical History:  Procedure Laterality Date  . ABDOMINAL HYSTERECTOMY    . APPENDECTOMY    . CHOLECYSTECTOMY      No family history on file.  Social History:  reports that she has been smoking. She has never used smokeless tobacco. She reports that she does not drink alcohol or use drugs.  Allergies:  Allergies  Allergen Reactions  . Fioricet [Butalbital-Apap-Caffeine] Other (See Comments)    "crazy in my head"  . Klonopin [Clonazepam] Other (See Comments)    "crazy in my head"  . Penicillins Rash    Has patient had a PCN reaction causing immediate rash, facial/tongue/throat swelling, SOB or lightheadedness with hypotension: Yes Has patient had a PCN reaction causing severe rash involving mucus membranes or skin necrosis: No Has patient had a PCN reaction that required hospitalization: No Has patient had a PCN reaction occurring within the last 10 years: No If all of the above answers are "NO", then may proceed with Cephalosporin use.     Medications: I have reviewed the patient's current medications.  Results for orders placed or  performed during the hospital encounter of 06/14/18 (from the past 48 hour(s))  CBC with Differential     Status: Abnormal   Collection Time: 06/14/18  4:22 PM  Result Value Ref Range   WBC 10.6 (H) 4.0 - 10.5 K/uL   RBC 4.10 3.87 - 5.11 MIL/uL   Hemoglobin 12.7 12.0 - 15.0 g/dL   HCT 16.1 09.6 - 04.5 %   MCV 97.6 80.0 - 100.0 fL   MCH 31.0 26.0 - 34.0 pg   MCHC 31.8 30.0 - 36.0 g/dL   RDW 40.9 81.1 - 91.4 %   Platelets 170 150 - 400 K/uL   nRBC 0.0 0.0 - 0.2 %   Neutrophils Relative % 56 %   Neutro Abs 5.9 1.7 - 7.7 K/uL   Lymphocytes Relative 31 %   Lymphs Abs 3.3 0.7 - 4.0 K/uL   Monocytes Relative 10 %   Monocytes Absolute 1.1 (H) 0.1 - 1.0 K/uL   Eosinophils Relative 2 %   Eosinophils Absolute 0.2 0.0 - 0.5 K/uL   Basophils Relative 0 %   Basophils Absolute 0.0 0.0 - 0.1 K/uL   Immature Granulocytes 1 %   Abs Immature Granulocytes 0.06 0.00 - 0.07 K/uL    Comment: Performed at [facility], [address] [place], [place] [address]  Basic metabolic panel     Status: Abnormal   Collection Time: 06/14/18  4:22 PM  Result Value Ref Range   Sodium 137 135 - 145 mmol/L   Potassium 2.7 (LL) 3.5 - 5.1 mmol/L  Comment: CRITICAL RESULT CALLED TO, READ BACK BY AND VERIFIED WITH: GOUGE S RN [phone] [phone] PHILLIPS C    Chloride 100 98 - 111 mmol/L   CO2 29 22 - 32 mmol/L   Glucose, Bld 132 (H) 70 - 99 mg/dL   BUN 35 (H) 6 - 20 mg/dL   Creatinine, Ser 1.61 (H) 0.44 - 1.00 mg/dL   Calcium 9.2 8.9 - 09.6 mg/dL   GFR calc non Af Amer 48 (L) >60 mL/min   GFR calc Af Amer 55 (L) >60 mL/min   Anion gap 8 5 - 15    Comment: Performed at [facility], [address] [place], [place] [address]  Troponin I - ONCE - STAT     Status: Abnormal   Collection Time: 06/14/18  4:22 PM  Result Value Ref Range   Troponin I 0.52 (HH) <0.03 ng/mL    Comment: CRITICAL RESULT CALLED TO, READ BACK BY AND VERIFIED WITH: Fritzi Mandes RN [phone] PHILLIPS C Performed at [facility], [address] [place], [place] [address]   D-dimer, quantitative (not at [facility])     Status: Abnormal   Collection Time: 06/14/18  4:22 PM  Result Value Ref Range   D-Dimer, Quant 0.52 (H) 0.00 - 0.50 ug/mL-FEU    Comment: (NOTE) At the manufacturer cut-off of 0.50 ug/mL FEU, this assay has been documented to exclude PE with a sensitivity and negative predictive value of 97 to 99%.  At this time, this assay has not been approved by the FDA to exclude DVT/VTE. Results should be correlated with clinical presentation. Performed at [facility], [address] [place], [place] [address]   Magnesium     Status: None   Collection Time: 06/14/18  4:22 PM  Result Value Ref Range   Magnesium 2.2 1.7 - 2.4 mg/dL    Comment: Performed at [facility], [address] [place], [place] [address]  Troponin I - ONCE - STAT     Status: Abnormal   Collection Time: 06/14/18  9:12 PM  Result Value Ref Range   Troponin I 0.30 (HH) <0.03 ng/mL    Comment: CRITICAL VALUE NOTED.  VALUE IS CONSISTENT WITH PREVIOUSLY REPORTED AND CALLED VALUE. Performed at [facility], [address] [place], [place] [address]   CK     Status: Abnormal   Collection Time: 06/14/18 11:14 PM  Result Value Ref Range   Total CK 693 (H) 38 - 234 U/L    Comment: Performed at [facility], [address] [place], [place] [address]  Basic metabolic panel     Status: Abnormal   Collection Time: 06/14/18 11:26 PM  Result Value Ref Range   Sodium 137 135 - 145 mmol/L   Potassium 3.4 (L) 3.5 - 5.1 mmol/L   Chloride 101 98 - 111 mmol/L   CO2 24 22 - 32 mmol/L   Glucose, Bld 195 (H) 70 - 99 mg/dL   BUN 31 (H) 6 - 20 mg/dL   Creatinine, Ser 4.13 (H) 0.44 - 1.00 mg/dL   Calcium 9.0 8.9 - 24.4 mg/dL   GFR calc non Af Amer 51 (L) >60 mL/min   GFR calc Af Amer 59 (L) >60 mL/min   Anion gap 12 5 - 15    Comment: Performed at [facility], [address]  [place], [place] [address]  CBC  Status: Abnormal   Collection Time: 06/14/18 11:26 PM  Result Value Ref Range   WBC 10.1 4.0 - 10.5 K/uL   RBC 3.95 3.87 - 5.11 MIL/uL   Hemoglobin 11.9 (L) 12.0 - 15.0 g/dL   HCT 04.538.1 40.936.0 - 81.146.0 %   MCV 96.5 80.0 - 100.0 fL   MCH 30.1 26.0 - 34.0 pg   MCHC 31.2 30.0 - 36.0 g/dL   RDW 91.413.9 78.211.5 - 95.615.5 %   Platelets 175 150 - 400 K/uL   nRBC 0.0 0.0 - 0.2 %    Comment: Performed at [facility], [address] [place], [place] [address]  Magnesium     Status: None   Collection Time: 06/14/18 11:26 PM  Result Value Ref Range   Magnesium 1.8 1.7 - 2.4 mg/dL    Comment: Performed at [facility], [address] [place], [place] [address]  Lipid panel     Status: Abnormal   Collection Time: 06/14/18 11:26 PM  Result Value Ref Range   Cholesterol 165 0 - 200 mg/dL   Triglycerides 696191 (H) <150 mg/dL   HDL 64 >29>40 mg/dL   Total CHOL/HDL Ratio 2.6 RATIO   VLDL 38 0 - 40 mg/dL   LDL Cholesterol 63 0 - 99 mg/dL    Comment:        Total Cholesterol/HDL:CHD Risk Coronary Heart Disease Risk Table                     Men   Women  1/2 Average Risk   3.4   3.3  Average Risk       5.0   4.4  2 X Average Risk   9.6   7.1  3 X Average Risk  23.4   11.0        Use the calculated Patient Ratio above and the CHD Risk Table to determine the patient's CHD Risk.        ATP III CLASSIFICATION (LDL):  <100     mg/dL   Optimal  [phone]  mg/dL   Near or Above                    Optimal  130-159  mg/dL   Borderline  [phone]  mg/dL   High  >272>190     mg/dL   Very High Performed at [facility], [address] [place], [place] [address]   Hemoglobin A1c     Status: None   Collection Time: 06/14/18 11:26 PM  Result Value Ref Range   Hgb A1c MFr Bld 5.5 4.8 - 5.6 %    Comment: (NOTE) Pre diabetes:          5.7%-6.4% Diabetes:              >6.4% Glycemic control for   <7.0% adults with diabetes    Mean Plasma Glucose 111.15 mg/dL    Comment:  Performed at [facility], [address] [place], [place] [address]  CBC     Status: None   Collection Time: 06/15/18  5:20 AM  Result Value Ref Range   WBC 6.8 4.0 - 10.5 K/uL   RBC 4.13 3.87 - 5.11 MIL/uL   Hemoglobin 12.4 12.0 - 15.0 g/dL   HCT 42.540.2 95.636.0 - 38.746.0 %   MCV 97.3 80.0 - 100.0 fL   MCH 30.0 26.0 - 34.0 pg   MCHC 30.8 30.0 - 36.0 g/dL  RDW 14.1 11.5 - 15.5 %   Platelets 190 150 - 400 K/uL   nRBC 0.0 0.0 - 0.2 %    Comment: Performed at [facility], [address], [place], [place] [address]  Heparin level (unfractionated)     Status: Abnormal   Collection Time: 06/15/18  5:20 AM  Result Value Ref Range   Heparin Unfractionated <0.10 (L) 0.30 - 0.70 IU/mL    Comment: (NOTE) If heparin results are below expected values, and patient dosage has  been confirmed, suggest follow up testing of antithrombin III levels. Performed at [facility], [address], [place], [place] [address]     Dg Chest 2 View  Result Date: 06/14/2018 CLINICAL DATA:  Shortness of breath EXAM: CHEST - 2 VIEW COMPARISON:  06/03/2018 FINDINGS: Lungs are hyperexpanded. Interstitial markings are diffusely coarsened with chronic features. The lungs are clear without focal pneumonia, edema, pneumothorax or pleural effusion. The cardiopericardial silhouette is within normal limits for size. The visualized bony structures of the thorax are intact. Telemetry leads overlie the chest. IMPRESSION: Hyperexpansion suggests emphysema. No acute cardiopulmonary findings. Electronically Signed   By: Kennith Center M.D.   On: 06/14/2018 18:14   Ct Angio Chest Pe W And/or Wo Contrast  Result Date: 06/14/2018 CLINICAL DATA:  Shortness of breath. COPD. Central chest pain. Elevated D-dimer. EXAM: CT ANGIOGRAPHY CHEST WITH CONTRAST TECHNIQUE: Multidetector CT imaging of the chest was performed using the standard protocol during bolus administration of intravenous contrast. Multiplanar CT image  reconstructions and MIPs were obtained to evaluate the vascular anatomy. CONTRAST:  81mL ISOVUE-370 IOPAMIDOL (ISOVUE-370) INJECTION 76% COMPARISON:  Chest radiographs obtained earlier today. Chest, abdomen and pelvis CTA dated 01/13/2017. FINDINGS: Cardiovascular: Normally opacified pulmonary arteries with no pulmonary arterial filling defects seen. Mild atheromatous arterial calcifications, including the thoracic aorta. Mediastinum/Nodes: No enlarged mediastinal, hilar, or axillary lymph nodes. Thyroid gland, trachea, and esophagus demonstrate no significant findings. Lungs/Pleura: Moderate right and mild-to-moderate left bullous changes. No lung nodules or pleural fluid. Upper Abdomen: Cholecystectomy clips. Musculoskeletal: Thoracic spine degenerative changes. Review of the MIP images confirms the above findings. IMPRESSION: 1. No pulmonary emboli or acute abnormality. 2. COPD. Aortic Atherosclerosis (ICD10-I70.0) and Emphysema (ICD10-J43.9). Electronically Signed   By: Beckie Salts M.D.   On: 06/14/2018 19:35    Cardiac studies: EKG 06/09/2018: Sinus rhythm.  Normal axis.  Normal conduction.  Minimal ST elevation in lead V1, V2 not eating STEMI criteria.  Nonspecific inferolateral ST-T changes  Echocardiogram 06/15/2018:  - Left ventricle: The cavity size was normal. Wall thickness was   normal. Systolic function was normal. The estimated ejection   fraction was in the range of 55% to 65%. Wall motion was normal;   there were no regional wall motion abnormalities. Doppler   parameters are consistent with abnormal left ventricular   relaxation (grade 1 diastolic dysfunction). - No significant valvular abnormality. - No evidence of pulmonary hypertension.  Review of Systems  Constitutional: Negative.   HENT: Negative.   Eyes: Negative.   Respiratory: Positive for cough, shortness of breath and wheezing.   Cardiovascular: Positive for chest pain. Negative for orthopnea and claudication.   Gastrointestinal: Negative.   Genitourinary: Negative.   Musculoskeletal: Negative.   Neurological: Negative for dizziness and loss of consciousness.  Endo/Heme/Allergies: Does not bruise/bleed easily.  Psychiatric/Behavioral: Negative.   All other systems reviewed and are negative.  Blood pressure (!) 157/84, pulse 87, temperature 98.7 F (37.1 C), temperature source Oral, resp. rate 18,  height 5\' 7"  (1.702 m), weight 88.6 kg, SpO2 97 %. Physical Exam  Nursing note and vitals reviewed. Constitutional: She is oriented to person, place, and time. She appears well-developed and well-nourished. No distress.  HENT:  Head: Normocephalic and atraumatic.  Eyes: Pupils are equal, round, and reactive to light. Conjunctivae are normal.  Neck: No JVD present.  Cardiovascular: Normal rate and regular rhythm.  No murmur heard. Pulses:      Dorsalis pedis pulses are 1+ on the right side, and 1+ on the left side.       Posterior tibial pulses are 1+ on the right side, and 1+ on the left side.  Respiratory: Effort normal. She has wheezes.  GI: Soft. Bowel sounds are normal. There is no tenderness. There is no rebound.  Musculoskeletal: She exhibits edema (Trace b/l).  Neurological: She is alert and oriented to person, place, and time. She has normal reflexes.  Skin: Skin is warm and dry.  Psychiatric: She has a normal mood and affect.    Assessment/Recommendations:  53 year old Caucasian female with hypertension, 50-pack-year smoker, COPD, family history of coronary artery disease, now admitted with COPD exacerbation and NSTEMI.  NSTEMI: Chest pain free on my interview earlier today.  Patient did have one episode of chest pain responding to sublingual nitroglycerin.  Recommend aspirin, heparin, high-intensity statin, IV nitroglycerin drip.  Recommend IV hydration given her mild acute kidney injury with plans for cardiac catheterization tomorrow on 04/16/2018.  If patient has recurrent chest pain  not controlled with nitroglycerin, or develops any hemodynamic or electrical instability, will proceed with urgent catheterization today.  Hypertension: On lisinopril 20 mg daily at baseline.  Currently held due to acute kidney injury.  Recommend amlodipine 5 mg daily for pressure control.  Ideally, would like to start beta blocker.  However, given her severe wheezing, unable to do so.  Had hydralazine IV p.r.n. for adequate blood pressure control  COPD exacerbation: Patient has bilateral active wheezing.  Management as per primary team.  Tobacco abuse: Emphasized cessation. On nicotine patch     Avory Mimbs J Jory Tanguma 06/15/2018, 11:51 AM   Maryn Freelove Emiliano Dyer, MD [facility] [place] Pager: [phone] Office: [phone] If no answer Cell [phone]

## 2018-06-15 NOTE — Progress Notes (Signed)
RT instructed pt and family on the use of incentive spirometer and flutter valve.  Pt able to demonstrate back good technique on both devices.  Pt able to reach 1500 mL using incentive spirometer.

## 2018-06-15 NOTE — Progress Notes (Signed)
Initiated nitro drip, witness Engineer, technical salesGrace RN  Pt connected to q 1 hour vital signs  Awaiting bed placement  RRT aware of titratable drip initiated

## 2018-06-15 NOTE — Progress Notes (Signed)
PROGRESS NOTE    Bethany Stuart  BMW:[id number] DOB: 07-30-1965 DOA: 06/14/2018 PCP: System, Pcp Not In  Brief Narrative:52 y.o. female with lumbar degenerative disc disease, HTN, sciatica and chronic back pain (prescribed PO opioids), COPD, hx of smoking who presented to ED with dyspnea and R sided chest pain. She had a mechanical fall (frequent falls due to R sided sciatica) onto her right side and has reports some lower R flank/chest discomfort. For past few days reports progressive non productive cough, URI sx and increased wheezing not responsive to home inhalers. Also was experiencing substernal to right sided upper chest pain, episodic sometimes worse with breathing. Reports having prior episodes of exertional chest pain as twinges in the past. Upon arrival to [facility] she had ambulated to the bathroom and had recurrence of R sided chest pain after ambulating that was not associated with respiration.  No prior cardiac workup. Reports both mother and brother had CAD including MI < 55 years.    Assessment & Plan:   Active Problems:   COPD exacerbation (HCC)   Chest pain  #1 chest pain consistent with acute coronary syndrome with elevated troponin and ST-T wave changes patient continues to complain of substernal chest pain radiating to the back started on heparin will start on nitroglycerin drip.  Patient to have cardiac cath tomorrow   #2 COPD exacerbation patient is wheezing diffusely I will start her on Solu-Medrol and DC prednisone.  Continue Z-Pak.  #3 AKI improved  #4 hypokalemia repleted and recheck.  Keep K above 4.  #5 hypomagnesemia 1.8 replete.  #6 hypertension on lisinopril which is being hold due to AKI,increase norvasc to 10 mg    Estimated body mass index is 30.6 kg/m as calculated from the following:   Height as of this encounter: 5\' 7"  (1.702 m).   Weight as of this encounter: 88.6 kg.  DVT prophylaxis: Heparin Code Status full code Family Commu nication: Discussed  with husband in the room Disposition Plan: Pending clinical improvement  Consultants: Cardiology  Procedures: None Antimicrobials: None  Subjective: Resting in bed complaining of substernal chest pain radiating to the right side associated with shortness of breath patient is on room air saturation above 92%.   Objective: Vitals:   06/15/18 0826 06/15/18 1120 06/15/18 1131 06/15/18 1149  BP:  (!) 184/91 (!) 157/84   Pulse: 79 90 87 84  Resp: 16 18  20   Temp:  98.7 F (37.1 C)    TempSrc:  Oral    SpO2: 96% 97%  97%  Weight:      Height:        Intake/Output Summary (Last 24 hours) at 06/15/2018 1221 Last data filed at 06/15/2018 0906 Gross per 24 hour  Intake 782.74 ml  Output 2000 ml  Net -1217.26 ml   Filed Weights   06/14/18 1526 06/15/18 0401  Weight: 79.4 kg 88.6 kg    Examination:  General exam: Appears calm and comfortable  Respiratory system: Diffuse wheezing to auscultation. Respiratory effort normal. Cardiovascular system: S1 & S2 heard, RRR. No JVD, murmurs, rubs, gallops or clicks. No pedal edema. Gastrointestinal system: Abdomen is nondistended, soft and nontender. No organomegaly or masses felt. Normal bowel sounds heard. Central nervous system: Alert and oriented. No focal neurological deficits. Extremities: Symmetric 5 x 5 power. Skin: No rashes, lesions or ulcers Psychiatry: Judgement and insight appear normal. Mood & affect appropriate.     Data Reviewed: I have personally reviewed following labs and imaging studies  CBC: Recent  Labs  Lab 06/14/18 1622 06/14/18 2326 06/15/18 0520  WBC 10.6* 10.1 6.8  NEUTROABS 5.9  --   --   HGB 12.7 11.9* 12.4  HCT 40.0 38.1 40.2  MCV 97.6 96.5 97.3  PLT 170 175 190   Basic Metabolic Panel: Recent Labs  Lab 06/14/18 1622 06/14/18 2326  NA 137 137  K 2.7* 3.4*  CL 100 101  CO2 29 24  GLUCOSE 132* 195*  BUN 35* 31*  CREATININE 1.28* 1.22*  CALCIUM 9.2 9.0  MG 2.2 1.8   GFR: Estimated  Creatinine Clearance: 61 mL/min (A) (by C-G formula based on SCr of 1.22 mg/dL (H)). Liver Function Tests: No results for input(s): AST, ALT, ALKPHOS, BILITOT, PROT, ALBUMIN in the last 168 hours. No results for input(s): LIPASE, AMYLASE in the last 168 hours. No results for input(s): AMMONIA in the last 168 hours. Coagulation Profile: No results for input(s): INR, PROTIME in the last 168 hours. Cardiac Enzymes: Recent Labs  Lab 06/14/18 1622 06/14/18 2112 06/14/18 2314  CKTOTAL  --   --  693*  TROPONINI 0.52* 0.30*  --    BNP (last 3 results) No results for input(s): PROBNP in the last 8760 hours. HbA1C: Recent Labs    06/14/18 2326  HGBA1C 5.5   CBG: No results for input(s): GLUCAP in the last 168 hours. Lipid Profile: Recent Labs    06/14/18 2326  CHOL 165  HDL 64  LDLCALC 63  TRIG 191*  CHOLHDL 2.6   Thyroid Function Tests: No results for input(s): TSH, T4TOTAL, FREET4, T3FREE, THYROIDAB in the last 72 hours. Anemia Panel: No results for input(s): VITAMINB12, FOLATE, FERRITIN, TIBC, IRON, RETICCTPCT in the last 72 hours. Sepsis Labs: No results for input(s): PROCALCITON, LATICACIDVEN in the last 168 hours.  No results found for this or any previous visit (from the past 240 hour(s)).       Radiology Studies: Dg Chest 2 View  Result Date: 06/14/2018 CLINICAL DATA:  Shortness of breath EXAM: CHEST - 2 VIEW COMPARISON:  06/03/2018 FINDINGS: Lungs are hyperexpanded. Interstitial markings are diffusely coarsened with chronic features. The lungs are clear without focal pneumonia, edema, pneumothorax or pleural effusion. The cardiopericardial silhouette is within normal limits for size. The visualized bony structures of the thorax are intact. Telemetry leads overlie the chest. IMPRESSION: Hyperexpansion suggests emphysema. No acute cardiopulmonary findings. Electronically Signed   By: Kennith Center M.D.   On: 06/14/2018 18:14   Ct Angio Chest Pe W And/or Wo  Contrast  Result Date: 06/14/2018 CLINICAL DATA:  Shortness of breath. COPD. Central chest pain. Elevated D-dimer. EXAM: CT ANGIOGRAPHY CHEST WITH CONTRAST TECHNIQUE: Multidetector CT imaging of the chest was performed using the standard protocol during bolus administration of intravenous contrast. Multiplanar CT image reconstructions and MIPs were obtained to evaluate the vascular anatomy. CONTRAST:  81mL ISOVUE-370 IOPAMIDOL (ISOVUE-370) INJECTION 76% COMPARISON:  Chest radiographs obtained earlier today. Chest, abdomen and pelvis CTA dated 01/13/2017. FINDINGS: Cardiovascular: Normally opacified pulmonary arteries with no pulmonary arterial filling defects seen. Mild atheromatous arterial calcifications, including the thoracic aorta. Mediastinum/Nodes: No enlarged mediastinal, hilar, or axillary lymph nodes. Thyroid gland, trachea, and esophagus demonstrate no significant findings. Lungs/Pleura: Moderate right and mild-to-moderate left bullous changes. No lung nodules or pleural fluid. Upper Abdomen: Cholecystectomy clips. Musculoskeletal: Thoracic spine degenerative changes. Review of the MIP images confirms the above findings. IMPRESSION: 1. No pulmonary emboli or acute abnormality. 2. COPD. Aortic Atherosclerosis (ICD10-I70.0) and Emphysema (ICD10-J43.9). Electronically Signed   By: Viviann Spare  Azucena Kubaeid M.D.   On: 06/14/2018 19:35        Scheduled Meds: . aspirin EC  81 mg Oral Daily  . azithromycin  250 mg Oral QHS  . [START ON 06/16/2018] Influenza vac split quadrivalent PF  0.5 mL Intramuscular Tomorrow-1000  . ipratropium-albuterol  3 mL Nebulization QID  . nicotine  21 mg Transdermal Once  . [START ON 06/16/2018] pneumococcal 23 valent vaccine  0.5 mL Intramuscular Tomorrow-1000  . potassium chloride  40 mEq Oral Daily  . predniSONE  40 mg Oral Q breakfast   Continuous Infusions: . sodium chloride 150 mL/hr at 06/15/18 1057  . heparin 900 Units/hr (06/15/18 1056)  . nitroGLYCERIN        LOS: 1 day     Alwyn RenElizabeth G Ender Rorke, MD [facility]  If 7PM-7AM, please contact night-coverage [email or web address] Password [email or web address] 06/15/2018, 12:21 PM

## 2018-06-15 NOTE — Evaluation (Addendum)
Physical Therapy Evaluation Patient Details Name: Bethany Stuart MRN: [id number] DOB: 11-07-1965 Today's Date: 06/15/2018   History of Present Illness  Patient is a 52 y/o female who presents with SOB, body aches, and right sided chest pain. EKG- new ST wave changes. Elevated troponin. CXR-Hyperexpansion suggests emphysema. Admitted with chest pain in the setting of COPD flare up. PMH includes sciatica, HTN, COPD.   Clinical Impression  Patient presents with back pain/RLE pain (chronic), headache, chest pain, dyspnea on exertion, impaired balance and impaired endurance s/p above. Mobility limited mainly due to chest pain worsened with mobility with multiple PVCs on telemetry. PTA, pt requires some assist with ADLs and mobility at times secondary to back pain. Surgery is recommended for spine but pt refusing so has been having more difficulty since November doing IADLs and ADls. Lives with spouse who works. Tolerated short distance ambulation today with Min guard assist for safety. Will try DME next session as pt reports hx of fall due to RLE "giving out." Will follow acutely to maximize independence and mobility prior to return home.     Follow Up Recommendations Outpatient PT(pending improvement)    Equipment Recommendations  Other (comment)(TBA)    Recommendations for Other Services       Precautions / Restrictions Precautions Precautions: Fall Precaution Comments: hx of sciatica and RLE giving out Restrictions Weight Bearing Restrictions: No      Mobility  Bed Mobility Overal bed mobility: Needs Assistance Bed Mobility: Supine to Sit     Supine to sit: Modified independent (Device/Increase time);HOB elevated     General bed mobility comments: No assist needed.   Transfers Overall transfer level: Needs assistance Equipment used: None Transfers: Sit to/from Stand Sit to Stand: Supervision         General transfer comment: Supervision for safety.    Ambulation/Gait Ambulation/Gait assistance: Min guard Gait Distance (Feet): 60 Feet Assistive device: None Gait Pattern/deviations: Step-through pattern;Decreased stride length;Antalgic;Decreased stance time - right Gait velocity: decreased   General Gait Details: Slow, antalgic like gait due to pain through RLE. Chest pain worsened with movement. HR in 90s with multiple PVCs 10-12. 2-3/4 DOE.   Stairs            Wheelchair Mobility    Modified Rankin (Stroke Patients Only)       Balance Overall balance assessment: Needs assistance;History of Falls Sitting-balance support: Feet supported;No upper extremity supported Sitting balance-Leahy Scale: Good     Standing balance support: During functional activity Standing balance-Leahy Scale: Fair                               Pertinent Vitals/Pain Pain Assessment: Faces Faces Pain Scale: Hurts even more Pain Location: head, chest on right side, RLE Pain Descriptors / Indicators: Headache;Discomfort;Dull;Aching Pain Intervention(s): Monitored during session;Repositioned;Limited activity within patient's tolerance    Home Living Family/patient expects to be discharged to:: Private residence Living Arrangements: Spouse/significant other Available Help at Discharge: Family;Available PRN/intermittently Type of Home: House Home Access: Stairs to enter Entrance Stairs-Rails: Right Entrance Stairs-Number of Steps: 2 Home Layout: Two level;Able to live on main level with bedroom/bathroom Home Equipment: None      Prior Function Level of Independence: Needs assistance   Gait / Transfers Assistance Needed: Walks independently but only shorter distances due to pain down RLE and SOB.  ADL's / Homemaking Assistance Needed: Some help with ADls from spouse  Comments: Drives, does some grocery shopping- less since  November     Hand Dominance        Extremity/Trunk Assessment   Upper Extremity  Assessment Upper Extremity Assessment: Defer to OT evaluation    Lower Extremity Assessment Lower Extremity Assessment: RLE deficits/detail RLE Deficits / Details: Swelling present around knee joint-possible effusion- as well as at ankle.  RLE Sensation: decreased light touch(toes on right)       Communication   Communication: No difficulties  Cognition Arousal/Alertness: Awake/alert Behavior During Therapy: WFL for tasks assessed/performed Overall Cognitive Status: Within Functional Limits for tasks assessed                                        General Comments General comments (skin integrity, edema, etc.): Spouse present during session.     Exercises     Assessment/Plan    PT Assessment Patient needs continued PT services  PT Problem List Decreased strength;Decreased mobility;Pain;Impaired sensation;Decreased balance;Decreased knowledge of use of DME;Decreased activity tolerance;Cardiopulmonary status limiting activity       PT Treatment Interventions DME instruction;Functional mobility training;Balance training;Patient/family education;Gait training;Therapeutic activities;Stair training;Therapeutic exercise    PT Goals (Current goals can be found in the Care Plan section)  Acute Rehab PT Goals Patient Stated Goal: to get better PT Goal Formulation: With patient Time For Goal Achievement: 06/29/18 Potential to Achieve Goals: Good    Frequency Min 3X/week   Barriers to discharge Decreased caregiver support;Inaccessible home environment      Co-evaluation               AM-PAC PT "6 Clicks" Mobility  Outcome Measure Help needed turning from your back to your side while in a flat bed without using bedrails?: None Help needed moving from lying on your back to sitting on the side of a flat bed without using bedrails?: None Help needed moving to and from a bed to a chair (including a wheelchair)?: None Help needed standing up from a chair  using your arms (e.g., wheelchair or bedside chair)?: None Help needed to walk in hospital room?: A Little Help needed climbing 3-5 steps with a railing? : A Little 6 Click Score: 22    End of Session Equipment Utilized During Treatment: Gait belt Activity Tolerance: Treatment limited secondary to medical complications (Comment)(chest pain) Patient left: in bed;with call bell/phone within reach;with family/visitor present Nurse Communication: Mobility status;Other (comment)(chest pain) PT Visit Diagnosis: Muscle weakness (generalized) (M62.81);Unsteadiness on feet (R26.81);Difficulty in walking, not elsewhere classified (R26.2);Pain Pain - Right/Left: Right Pain - part of body: Leg(head, chest)    Time: [id number] PT Time Calculation (min) (ACUTE ONLY): 16 min   Charges:   PT Evaluation $PT Eval Moderate Complexity: 1 Mod          Mylo RedShauna Emry Barbato, PT, DPT Acute Rehabilitation Services Pager [phone] Office [phone]      Blake DivineShauna A Lanier EnsignHartshorne 06/15/2018, 9:32 AM

## 2018-06-15 NOTE — Progress Notes (Signed)
   06/15/18 1428  Vitals  BP (!) 173/111  MAP (mmHg) 125  ECG Heart Rate 93  Resp 18  Pain Assessment  Pain Scale 0-10  Pain Score 9  Patients Stated Pain Goal 0  Pain Type Acute pain  Pain Location Chest  Pain Orientation Right  Pain Descriptors / Indicators Sharp  Pain Frequency Intermittent  Pain Onset On-going  Pain Intervention(s) Medication (See eMAR);MD notified (Comment)   Cardiac MD call to communicate patient going to the cath lab ~4pm. Nitro gtt from 5 mcg to 20 mcg. I will continue to monitor the patient closely.   Sheppard Evensina Vestal Markin RN

## 2018-06-15 NOTE — Interval H&P Note (Signed)
History and Physical Interval Note:  06/15/2018 3:49 PM  Bethany MosherJulie Stuart  has presented today for surgery, with the diagnosis of nstemi  The various methods of treatment have been discussed with the patient and family. After consideration of risks, benefits and other options for treatment, the patient has consented to  Procedure(s): LEFT HEART CATH AND CORONARY ANGIOGRAPHY (N/A) as a surgical intervention .  The patient's history has been reviewed, patient examined, no change in status, stable for surgery.  I have reviewed the patient's chart and labs.  Questions were answered to the patient's satisfaction.     2016 Appropriate Use Criteria for Coronary Revascularization in Patients With Acute Coronary Syndrome NSTEMI/UA High Risk (TIMI Score 5-7) NSTEMI/Unstable angina, stabilized patient at high risk Link Here: https://powell.info/http://scaiaucapp.org/redirect/qit?177=412&189=435&190=439&192=444&193=447 Indication:  Revascularization by PCI or CABG of 1 or more arteries in a patient with NSTEMI or unstable angina with Stabilization after presentation High risk for clinical events  A (7) Indication: 16; Score 7 329   Bethany Stuart J Bethany Stuart

## 2018-06-15 NOTE — Progress Notes (Signed)
Started heparin IV, witness Angelina OkGeraldine RN Educated pt to inform RN if signs of bleeding occur  Will continue to monitor   2D echo and pharmacy at bedside now

## 2018-06-15 NOTE — Progress Notes (Signed)
Pt reporting sharp chest pain through the front chest to the back  Vitals and pain documented  1 SL nitro given  Paged MD Jerolyn CenterMathews  MD aware and stated she will speak to cardiology  Will continue to monitor

## 2018-06-15 NOTE — Progress Notes (Signed)
RRT notified of blood pressure, due to staff unable to titatrate Informed MD of increasing BP, MD aware, no new orders  Will continue to monitor    Primary RN aware  Primary RN and NT transported pt to 6 E with all belongings

## 2018-06-15 NOTE — Evaluation (Signed)
Occupational Therapy Evaluation Patient Details Name: Bethany Stuart MRN: [id number] DOB: 01/25/1966 Today's Date: 06/15/2018    History of Present Illness Patient is a 52 y/o female who presents with SOB, body aches, and right sided chest pain. EKG- new ST wave changes. Elevated troponin. CXR-Hyperexpansion suggests emphysema. PMH includes sciatica, HTN, COPD.    Clinical Impression   Eval limited due to pt reporting fatigue and having chest pain earlier when ambulating with PT. RN and aware and pt agreeable to bed level and EOB activity. Pt with decline in function and safety with ADLs and ADL mobility with decreased endurance and strength. Pt would benefit from acute OT services to address impairments to maximize level of function and safety with ADLs and ADL mobility    Follow Up Recommendations  No OT follow up;Supervision - Intermittent    Equipment Recommendations  Tub/shower seat;Other (comment)(ADL A/E)    Recommendations for Other Services       Precautions / Restrictions Precautions Precautions: Fall Precaution Comments: hx of sciatica and RLE giving out Restrictions Weight Bearing Restrictions: No      Mobility Bed Mobility Overal bed mobility: Needs Assistance Bed Mobility: Supine to Sit;Sit to Supine     Supine to sit: Modified independent (Device/Increase time);HOB elevated Sit to supine: Modified independent (Device/Increase time)   General bed mobility comments: No physical A needed.   Transfers Overall transfer level: Needs assistance Equipment used: None Transfers: Sit to/from Stand Sit to Stand: Supervision         General transfer comment: Supervision for safety.     Balance Overall balance assessment: Needs assistance;History of Falls Sitting-balance support: Feet supported;No upper extremity supported Sitting balance-Leahy Scale: Good     Standing balance support: During functional activity Standing balance-Leahy Scale: Fair                              ADL either performed or assessed with clinical judgement   ADL Overall ADL's : Needs assistance/impaired     Grooming: Wash/dry hands;Wash/dry face;Set up;Sitting   Upper Body Bathing: Set up;Sitting   Lower Body Bathing: Set up;Supervison/ safety;Sitting/lateral leans   Upper Body Dressing : Set up;Sitting   Lower Body Dressing: Set up;Supervision/safety;Sitting/lateral leans Lower Body Dressing Details (indicate cue type and reason): donned socks Toilet Transfer: Supervision/safety Toilet Transfer Details (indicate cue type and reason): siumlated, sit - stand from bed, SPT           General ADL Comments: assessment limited due to rpoeting chest pain wheh ambulating with PT. Pt agreeable to EOB activity. RN aware     Vision Baseline Vision/History: Wears glasses Wears Glasses: Reading only Patient Visual Report: No change from baseline       Perception     Praxis      Pertinent Vitals/Pain Pain Assessment: 0-10 Pain Score: 5  Pain Location: pt reports chest on R side after ambulation with PT Pain Descriptors / Indicators: Discomfort;Dull Pain Intervention(s): Limited activity within patient's tolerance;Monitored during session;Repositioned;Other (comment)(RN aware of c/o chest pain after ambulation with PT)     Hand Dominance Right   Extremity/Trunk Assessment Upper Extremity Assessment Upper Extremity Assessment: Overall WFL for tasks assessed   Lower Extremity Assessment Lower Extremity Assessment: Defer to PT evaluation   Cervical / Trunk Assessment Cervical / Trunk Assessment: Normal   Communication Communication Communication: No difficulties   Cognition Arousal/Alertness: Awake/alert Behavior During Therapy: WFL for tasks assessed/performed Overall Cognitive Status: Within Functional  Limits for tasks assessed                                     General Comments       Exercises     Shoulder  Instructions      Home Living Family/patient expects to be discharged to:: Private residence Living Arrangements: Spouse/significant other Available Help at Discharge: Family;Available PRN/intermittently Type of Home: House Home Access: Stairs to enter [employer] of Steps: 2 Entrance Stairs-Rails: Right Home Layout: Two level;Able to live on main level with bedroom/bathroom     Bathroom Shower/Tub: Chief Strategy Officer: Standard     Home Equipment: None          Prior Functioning/Environment Level of Independence: Needs assistance  Gait / Transfers Assistance Needed: Walks independently but only shorter distances due to pain down RLE and SOB. ADL's / Homemaking Assistance Needed: Some help with ADls from spouse   Comments: Drives, does some grocery shopping- less since November        OT Problem List: Decreased strength;Decreased activity tolerance;Decreased knowledge of use of DME or AE;Decreased knowledge of precautions;Pain      OT Treatment/Interventions: Self-care/ADL training;Energy conservation;DME and/or AE instruction;Therapeutic activities;Patient/family education    OT Goals(Current goals can be found in the care plan section) Acute Rehab OT Goals Patient Stated Goal: to get better OT Goal Formulation: With patient/family Time For Goal Achievement: 06/29/18 Potential to Achieve Goals: Good ADL Goals Pt Will Perform Grooming: with modified independence;with caregiver independent in assisting Pt Will Perform Upper Body Bathing: with modified independence;with caregiver independent in assisting Pt Will Perform Lower Body Bathing: with modified independence;with caregiver independent in assisting;with adaptive equipment Pt Will Perform Upper Body Dressing: with modified independence;with caregiver independent in assisting Pt Will Perform Lower Body Dressing: with modified independence;with caregiver independent in assisting;with adaptive  equipment Pt Will Transfer to Toilet: with modified independence;ambulating;regular height toilet Pt Will Perform Toileting - Clothing Manipulation and hygiene: with modified independence;sitting/lateral leans;sit to/from stand;with caregiver independent in assisting Pt Will Perform Tub/Shower Transfer: with supervision;with modified independence;ambulating;shower seat;with caregiver independent in assisting Additional ADL Goal #1: Pt will verbalize and demonstrate 3 EC techniques during ADLs and functional tasks  OT Frequency: Min 2X/week   Barriers to D/C: Other (comment);Decreased caregiver support  husband works during the day       Co-evaluation              AM-PAC OT "6 Clicks" Daily Activity     Outcome Measure Help from another person eating meals?: None Help from another person taking care of personal grooming?: A Little Help from another person toileting, which includes using toliet, bedpan, or urinal?: A Little Help from another person bathing (including washing, rinsing, drying)?: A Little Help from another person to put on and taking off regular upper body clothing?: None Help from another person to put on and taking off regular lower body clothing?: A Little 6 Click Score: 20   End of Session    Activity Tolerance: Patient tolerated treatment well Patient left: in bed;with call bell/phone within reach;with family/visitor present  OT Visit Diagnosis: Muscle weakness (generalized) (M62.81);History of falling (Z91.81)                    Charges:  OT General Charges $OT Visit: 1 Visit OT Evaluation $OT Eval Moderate Complexity: 1 Mod    Margaretmary Eddy  Jeanette 06/15/2018, 2:17 PM

## 2018-06-15 NOTE — Progress Notes (Signed)
   06/15/18 1503  Vitals  BP (!) 141/81  MAP (mmHg) 99  ECG Heart Rate 87  Resp 18  Pain Assessment  Pain Scale 0-10  Pain Score 3  Patients Stated Pain Goal 0  Pain Type Acute pain  Pain Location Chest  Pain Orientation Right  Pain Descriptors / Indicators Sharp  Pain Frequency Intermittent  Pain Onset On-going  Pain Intervention(s) Medication (See eMAR);Distraction;Elevated extremity;Emotional support  Pain and BP decrease rlt Nitro gtt. Will continue to monitor the patient closely.  Sheppard Evensina Gunda Maqueda RN

## 2018-06-16 ENCOUNTER — Encounter (HOSPITAL_COMMUNITY): Payer: Self-pay | Admitting: Cardiology

## 2018-06-16 DIAGNOSIS — E876 Hypokalemia: Secondary | ICD-10-CM

## 2018-06-16 DIAGNOSIS — J9621 Acute and chronic respiratory failure with hypoxia: Secondary | ICD-10-CM

## 2018-06-16 LAB — CBC
HCT: 39.2 % (ref 36.0–46.0)
Hemoglobin: 12.3 g/dL (ref 12.0–15.0)
MCH: 30.4 pg (ref 26.0–34.0)
MCHC: 31.4 g/dL (ref 30.0–36.0)
MCV: 96.8 fL (ref 80.0–100.0)
NRBC: 0 % (ref 0.0–0.2)
Platelets: 195 10*3/uL (ref 150–400)
RBC: 4.05 MIL/uL (ref 3.87–5.11)
RDW: 14.5 % (ref 11.5–15.5)
WBC: 11.8 10*3/uL — ABNORMAL HIGH (ref 4.0–10.5)

## 2018-06-16 LAB — BASIC METABOLIC PANEL
ANION GAP: 10 (ref 5–15)
BUN: 28 mg/dL — ABNORMAL HIGH (ref 6–20)
CO2: 25 mmol/L (ref 22–32)
Calcium: 8.8 mg/dL — ABNORMAL LOW (ref 8.9–10.3)
Chloride: 103 mmol/L (ref 98–111)
Creatinine, Ser: 1.09 mg/dL — ABNORMAL HIGH (ref 0.44–1.00)
GFR calc Af Amer: >60 mL/min (ref >60)
GFR calc non Af Amer: 58 mL/min — ABNORMAL LOW (ref >60)
Glucose, Bld: 196 mg/dL — ABNORMAL HIGH (ref 70–99)
POTASSIUM: 4.1 mmol/L (ref 3.5–5.1)
Sodium: 138 mmol/L (ref 135–145)

## 2018-06-16 LAB — MAGNESIUM: Magnesium: 2.7 mg/dL — ABNORMAL HIGH (ref 1.7–2.4)

## 2018-06-16 MED ORDER — IPRATROPIUM-ALBUTEROL 20-100 MCG/ACT IN AERS: 1.0000 | INHALATION_SPRAY | Freq: Four times a day (QID) | RESPIRATORY_TRACT | Status: DC | PRN

## 2018-06-16 MED ORDER — ASPIRIN 81 MG PO TBEC: 81.0000 mg | DELAYED_RELEASE_TABLET | Freq: Every day | ORAL | Status: DC

## 2018-06-16 MED ORDER — POTASSIUM CHLORIDE CRYS ER 20 MEQ PO TBCR
20.0000 meq | EXTENDED_RELEASE_TABLET | Freq: Two times a day (BID) | ORAL | Status: DC
  Administered 2018-06-16 – 2018-06-18 (×4): 20 meq via ORAL
  Filled 2018-06-16 (×4): qty 1

## 2018-06-16 MED ORDER — HYDRALAZINE HCL 20 MG/ML IJ SOLN
10.0000 mg | INTRAMUSCULAR | Status: DC | PRN
  Administered 2018-06-16: 10 mg via INTRAVENOUS
  Filled 2018-06-16: qty 1

## 2018-06-16 MED ORDER — BENAZEPRIL-HYDROCHLOROTHIAZIDE 20-12.5 MG PO TABS: 1.0000 | ORAL_TABLET | Freq: Every day | ORAL | 0 refills | Status: DC

## 2018-06-16 MED ORDER — HYDRALAZINE HCL 50 MG PO TABS
50.0000 mg | ORAL_TABLET | Freq: Four times a day (QID) | ORAL | Status: DC
  Administered 2018-06-16 – 2018-06-17 (×3): 50 mg via ORAL
  Filled 2018-06-16 (×3): qty 1

## 2018-06-16 MED ORDER — AMLODIPINE BESYLATE 10 MG PO TABS: 10.0000 mg | ORAL_TABLET | Freq: Every day | ORAL | 0 refills | Status: DC

## 2018-06-16 MED ORDER — FUROSEMIDE 10 MG/ML IJ SOLN: 20.0000 mg | Freq: Two times a day (BID) | INTRAMUSCULAR | Status: DC

## 2018-06-16 MED ORDER — FUROSEMIDE 10 MG/ML IJ SOLN
40.0000 mg | Freq: Once | INTRAMUSCULAR | Status: AC
  Administered 2018-06-16: 40 mg via INTRAVENOUS
  Filled 2018-06-16: qty 4

## 2018-06-16 MED ORDER — IPRATROPIUM-ALBUTEROL 0.5-2.5 (3) MG/3ML IN SOLN
3.0000 mL | Freq: Four times a day (QID) | RESPIRATORY_TRACT | Status: DC | PRN
  Administered 2018-06-16: 3 mL via RESPIRATORY_TRACT

## 2018-06-16 MED ORDER — AZITHROMYCIN 250 MG PO TABS: ORAL_TABLET | ORAL | 0 refills | Status: DC

## 2018-06-16 MED ORDER — CLOPIDOGREL BISULFATE 75 MG PO TABS: 75.0000 mg | ORAL_TABLET | Freq: Every day | ORAL | 1 refills | Status: DC

## 2018-06-16 MED ORDER — POTASSIUM CHLORIDE CRYS ER 20 MEQ PO TBCR
20.0000 meq | EXTENDED_RELEASE_TABLET | Freq: Once | ORAL | Status: AC
  Administered 2018-06-16: 20 meq via ORAL
  Filled 2018-06-16: qty 1

## 2018-06-16 MED ORDER — CLOPIDOGREL BISULFATE 75 MG PO TABS: 75.0000 mg | ORAL_TABLET | Freq: Every day | ORAL | 1 refills | Status: AC

## 2018-06-16 MED ORDER — ROSUVASTATIN CALCIUM 20 MG PO TABS: 20.0000 mg | ORAL_TABLET | Freq: Every day | ORAL | 1 refills | Status: DC

## 2018-06-16 MED ORDER — FUROSEMIDE 10 MG/ML IJ SOLN
40.0000 mg | Freq: Two times a day (BID) | INTRAMUSCULAR | Status: DC
  Administered 2018-06-16 – 2018-06-17 (×3): 40 mg via INTRAVENOUS
  Filled 2018-06-16 (×3): qty 4

## 2018-06-16 MED ORDER — PREDNISONE 10 MG PO TABS: ORAL_TABLET | ORAL | 0 refills | Status: DC

## 2018-06-16 MED ORDER — NICOTINE 21 MG/24HR TD PT24: 21.0000 mg | MEDICATED_PATCH | Freq: Every day | TRANSDERMAL | 0 refills | Status: DC

## 2018-06-16 NOTE — Progress Notes (Signed)
Pt's right radial cath site began to ooze prior to removing any air from TR band. Held additional pressure to site for 10 mins. Bleeding appears to have stopped. Will continue to monitor.

## 2018-06-16 NOTE — Progress Notes (Signed)
CARDIAC REHAB PHASE I   PRE:  Rate/Rhythm: 83 SR with PVC    BP: sitting 173/97    SaO2: 93 RA  MODE:  Ambulation: 40 ft   POST:  Rate/Rhythm: 98 SR (less PVCs)    BP: sitting 187/108     SaO2: 95 RA  Pt very SOB with walking. Also her legs are weak due to fluid overload. She sts she has gained 30 lbs in a couple weeks. Her BP is elevated. Denied CP but does tell the nurse her right ribs hurt. Discussed MI, restrictions, diet, smoking cessation, and CRPII. She is contemplating smoking cessation. Gave resources. Will refer to CRPII in [place], hoping she can do seated exercise (due to sciatica). I do not think pt is ready to d/c today due to fluid, BP, SOB. Pt sts she agrees. [id number]  Bethany MassonRandi Kristan Raju Stuart CES, ACSM 06/16/2018 2:03 PM

## 2018-06-16 NOTE — Progress Notes (Signed)
Spoke with Dr and made her aware pt's Bp still in the 180's. Dr. Jerolyn CenterMathews stated to cancel the discharge for today. New orders put into epic. Will cont to monitor pt.

## 2018-06-16 NOTE — Discharge Summary (Addendum)
Physician Discharge Summary  Bethany Stuart UJW:[id number] DOB: [birth year] DOA: 06/14/2018  PCP: System, Pcp Not In  Admit date: 06/14/2018 Discharge date: 06/17/18  Admitted From: Home Disposition: Home  Recommendations for Outpatient Follow-up:  1. Follow up with PCP in 1-2 weeks 2. Please obtain BMP/CBC in one week 3. Follow-up with cardiology Dr. Rosemary Holms  Home Health: out patient OT PT AT [facility] Equipment/Devices none Discharge Condition: Stable CODE STATUS full code Diet recommendation cardiac  Brief/Interim Summary:52 y.o.femalewithlumbar degenerative disc disease, HTN, sciatica and chronic back pain (prescribed PO opioids), COPD, hx of smoking who presented to ED with dyspnea and R sided chest pain. She had a mechanical fall (frequent falls due to R sided sciatica) onto her right side and has reports some lower R flank/chest discomfort. For past few days reports progressive non productive cough, URI sx and increased wheezing not responsive to home inhalers. Also was experiencing substernal to right sided upper chest pain, episodic sometimes worse with breathing. Reports having prior episodes of exertional chest pain as twinges in the past. Upon arrival to [facility] she had ambulated to the bathroom and had recurrence of R sided chest pain after ambulating that was not associated with respiration.  No prior cardiac workup. Reports both mother and brother had CAD including MI < 55 years.   Discharge Diagnoses:  Active Problems:   COPD exacerbation (HCC)   Chest pain   Hypokalemia   Hypomagnesemia  #1 non-ST elevation MI acute coronary syndrome patient admitted with chest pain found to have ST-T wave changes and elevated troponin.  She was treated with IV heparin, nitroglycerin drip.Echo showed-Left ventricle: The cavity size was normal. Wall thickness was   normal. Systolic function was normal. The estimated ejection   fraction was in the range of 55% to 65%. Wall motion was  normal;   there were no regional wall motion abnormalities. Doppler   parameters are consistent with abnormal left ventricular   relaxation (grade 1 diastolic dysfunction). - No significant valvular abnormality. - No evidence of pulmonary hypertension  Cath-LM: Normal LAD: Mid 30% stenosis LCx: Normal RCA: Prox 40%, mid 60% stenoses. dFR 0.93 (Physiologically nonsignificant)   continue aspirin and plavix for one year..  #2 COPD exacerbation-start prednisone taper   Continue Z-Pak,combivent.  On the day of discharge she ambulated without oxygen her saturation was 92 to 95%.  She does not need oxygen upon discharge.  #3 AKI improved  #4 hypokalemia repleted and recheck.  #5 hypomagnesemia 1.8 repleted  #6 hypertension -blood pressure improved after IV Lasix and hydralazine started along with ACE inhibitor.  Will discharge her home today on all the antihypertensives. Estimated body mass index is 31.65 kg/m as calculated from the following:   Height as of this encounter: 5\' 7"  (1.702 m).   Weight as of this encounter: 91.7 kg.  Discharge Instructions  Discharge Instructions    Call MD for:  difficulty breathing, headache or visual disturbances   Complete by:  As directed    Call MD for:  persistant dizziness or light-headedness   Complete by:  As directed    Call MD for:  persistant nausea and vomiting   Complete by:  As directed    Call MD for:  severe uncontrolled pain   Complete by:  As directed    Diet - low sodium heart healthy   Complete by:  As directed    Increase activity slowly   Complete by:  As directed      Allergies as of  06/16/2018      Reactions   Fioricet [butalbital-apap-caffeine] Other (See Comments)   "crazy in my head"   Klonopin [clonazepam] Other (See Comments)   "crazy in my head"   Penicillins Rash   Has patient had a PCN reaction causing immediate rash, facial/tongue/throat swelling, SOB or lightheadedness with hypotension: Yes Has  patient had a PCN reaction causing severe rash involving mucus membranes or skin necrosis: No Has patient had a PCN reaction that required hospitalization: No Has patient had a PCN reaction occurring within the last 10 years: No If all of the above answers are "NO", then may proceed with Cephalosporin use.      Medication List    STOP taking these medications   lisinopril 20 MG tablet Commonly known as:  PRINIVIL,ZESTRIL     TAKE these medications   amLODipine 10 MG tablet Commonly known as:  NORVASC Take 1 tablet (10 mg total) by mouth daily. Start taking on:  06/17/2018   aspirin 81 MG EC tablet Take 1 tablet (81 mg total) by mouth daily. Start taking on:  06/17/2018   azithromycin 250 MG tablet Commonly known as:  ZITHROMAX Take 1 tablet daily for 5 days   benazepril-hydrochlorthiazide 20-12.5 MG tablet Commonly known as:  LOTENSIN HCT Take 1 tablet by mouth daily.   clopidogrel 75 MG tablet Commonly known as:  PLAVIX Take 1 tablet (75 mg total) by mouth daily. Start taking on:  06/17/2018   nicotine 21 mg/24hr patch Commonly known as:  NICODERM CQ - dosed in mg/24 hours Place 1 patch (21 mg total) onto the skin daily. Start taking on:  06/17/2018   oxyCODONE-acetaminophen 5-325 MG tablet Commonly known as:  PERCOCET/ROXICET Take 1 tablet by mouth every 12 (twelve) hours as needed for severe pain.   rosuvastatin 20 MG tablet Commonly known as:  CRESTOR Take 1 tablet (20 mg total) by mouth daily at 6 PM.      Follow-up Information    Patwardhan, Anabel Bene, MD Follow up.   Specialty:  Cardiology Why:  1:00 PM Contact information: [address] [place] [address] [phone]          Allergies  Allergen Reactions  . Fioricet [Butalbital-Apap-Caffeine] Other (See Comments)    "crazy in my head"  . Klonopin [Clonazepam] Other (See Comments)    "crazy in my head"  . Penicillins Rash    Has patient had a PCN reaction causing immediate rash,  facial/tongue/throat swelling, SOB or lightheadedness with hypotension: Yes Has patient had a PCN reaction causing severe rash involving mucus membranes or skin necrosis: No Has patient had a PCN reaction that required hospitalization: No Has patient had a PCN reaction occurring within the last 10 years: No If all of the above answers are "NO", then may proceed with Cephalosporin use.     Consultations: cardiology   Procedures/Studies: Dg Chest 2 View  Result Date: 06/14/2018 CLINICAL DATA:  Shortness of breath EXAM: CHEST - 2 VIEW COMPARISON:  06/03/2018 FINDINGS: Lungs are hyperexpanded. Interstitial markings are diffusely coarsened with chronic features. The lungs are clear without focal pneumonia, edema, pneumothorax or pleural effusion. The cardiopericardial silhouette is within normal limits for size. The visualized bony structures of the thorax are intact. Telemetry leads overlie the chest. IMPRESSION: Hyperexpansion suggests emphysema. No acute cardiopulmonary findings. Electronically Signed   By: Kennith Center M.D.   On: 06/14/2018 18:14   Dg Ribs Unilateral W/chest Right  Result Date: 06/03/2018 CLINICAL DATA:  Fall with right  rib pain. Initial encounter. EXAM: RIGHT RIBS AND CHEST - 3+ VIEW COMPARISON:  None. FINDINGS: No fracture or other bone lesions are seen involving the ribs. There is no evidence of pneumothorax or pleural effusion. Both lungs are clear. Hyperinflation heart size and mediastinal contours are within normal limits. IMPRESSION: No acute finding. Hyperinflation correlating with COPD history. Electronically Signed   By: Marnee Spring M.D.   On: 06/03/2018 08:36   Ct Angio Chest Pe W And/or Wo Contrast  Result Date: 06/14/2018 CLINICAL DATA:  Shortness of breath. COPD. Central chest pain. Elevated D-dimer. EXAM: CT ANGIOGRAPHY CHEST WITH CONTRAST TECHNIQUE: Multidetector CT imaging of the chest was performed using the standard protocol during bolus administration  of intravenous contrast. Multiplanar CT image reconstructions and MIPs were obtained to evaluate the vascular anatomy. CONTRAST:  81mL ISOVUE-370 IOPAMIDOL (ISOVUE-370) INJECTION 76% COMPARISON:  Chest radiographs obtained earlier today. Chest, abdomen and pelvis CTA dated 01/13/2017. FINDINGS: Cardiovascular: Normally opacified pulmonary arteries with no pulmonary arterial filling defects seen. Mild atheromatous arterial calcifications, including the thoracic aorta. Mediastinum/Nodes: No enlarged mediastinal, hilar, or axillary lymph nodes. Thyroid gland, trachea, and esophagus demonstrate no significant findings. Lungs/Pleura: Moderate right and mild-to-moderate left bullous changes. No lung nodules or pleural fluid. Upper Abdomen: Cholecystectomy clips. Musculoskeletal: Thoracic spine degenerative changes. Review of the MIP images confirms the above findings. IMPRESSION: 1. No pulmonary emboli or acute abnormality. 2. COPD. Aortic Atherosclerosis (ICD10-I70.0) and Emphysema (ICD10-J43.9). Electronically Signed   By: Beckie Salts M.D.   On: 06/14/2018 19:35   Ct Abdomen Pelvis W Contrast  Result Date: 06/03/2018 CLINICAL DATA:  Right upper quadrant abdominal pain after fall yesterday. EXAM: CT ABDOMEN AND PELVIS WITH CONTRAST TECHNIQUE: Multidetector CT imaging of the abdomen and pelvis was performed using the standard protocol following bolus administration of intravenous contrast. CONTRAST:  ISOVUE-300 IOPAMIDOL (ISOVUE-300) INJECTION 61% COMPARISON:  CT scan of January 12, 2018 and July 17, 2013. FINDINGS: Lower chest: No acute abnormality. Hepatobiliary: No focal liver abnormality is seen. Status post cholecystectomy. No biliary dilatation. Pancreas: Unremarkable. No pancreatic ductal dilatation or surrounding inflammatory changes. Spleen: Normal in size without focal abnormality. Adrenals/Urinary Tract: Stable hyperplasia left adrenal gland is noted. Right adrenal gland appears normal. No  hydronephrosis or renal obstruction is noted. No renal or ureteral calculi are noted. Urinary bladder is unremarkable. Stomach/Bowel: The stomach appears normal. There is no evidence of bowel obstruction or inflammation. Status post appendectomy. Vascular/Lymphatic: Aortic atherosclerosis. No enlarged abdominal or pelvic lymph nodes. Reproductive: Status post hysterectomy. No adnexal masses. Other: No abdominal wall hernia or abnormality. No abdominopelvic ascites. Musculoskeletal: Severe degenerative disc disease is noted at L3-4 and L4-5. No acute osseous abnormality is noted. IMPRESSION: No acute abnormality seen in the abdomen or pelvis. Stable multilevel degenerative disc disease is noted in lower lumbar spine. Aortic Atherosclerosis (ICD10-I70.0). Electronically Signed   By: Lupita Raider, M.D.   On: 06/03/2018 09:19    (Echo, Carotid, EGD, Colonoscopy, ERCP)    Subjective:   Discharge Exam: Vitals:   06/16/18 1131 06/16/18 1228  BP:  (!) 163/90  Pulse:  85  Resp:  (!) 21  Temp:  98.2 F (36.8 C)  SpO2: 96% 95%   Vitals:   06/16/18 0617 06/16/18 0808 06/16/18 1131 06/16/18 1228  BP:  (!) 157/84  (!) 163/90  Pulse:  71  85  Resp:  17  (!) 21  Temp:  98.4 F (36.9 C)  98.2 F (36.8 C)  TempSrc:  Oral  Oral  SpO2: 94% 95% 96% 95%  Weight:      Height:        General: Pt is alert, awake, not in acute distress Cardiovascular: RRR, S1/S2 +, no rubs, no gallops Respiratory: CTA bilaterally, no wheezing, no rhonchi Abdominal: Soft, NT, ND, bowel sounds + Extremities: no edema, no cyanosis    The results of significant diagnostics from this hospitalization (including imaging, microbiology, ancillary and laboratory) are listed below for reference.     Microbiology: Recent Results (from the past 240 hour(s))  MRSA PCR Screening     Status: None   Collection Time: 06/15/18  2:52 PM  Result Value Ref Range Status   MRSA by PCR NEGATIVE NEGATIVE Final    Comment:         The GeneXpert MRSA Assay (FDA approved for NASAL specimens only), is one component of a comprehensive MRSA colonization surveillance program. It is not intended to diagnose MRSA infection nor to guide or monitor treatment for MRSA infections. Performed at [facility], [address] [place], [place] [id number]      Labs: BNP (last 3 results) No results for input(s): BNP in the last 8760 hours. Basic Metabolic Panel: Recent Labs  Lab 06/14/18 1622 06/14/18 2326 06/15/18 1449 06/16/18 0406  NA 137 137 137 138  K 2.7* 3.4* 3.9 4.1  CL 100 101 103 103  CO2 29 24 21* 25  GLUCOSE 132* 195* 197* 196*  BUN 35* 31* 28* 28*  CREATININE 1.28* 1.22* 1.27* 1.09*  CALCIUM 9.2 9.0 9.2 8.8*  MG 2.2 1.8  --  2.7*   Liver Function Tests: No results for input(s): AST, ALT, ALKPHOS, BILITOT, PROT, ALBUMIN in the last 168 hours. No results for input(s): LIPASE, AMYLASE in the last 168 hours. No results for input(s): AMMONIA in the last 168 hours. CBC: Recent Labs  Lab 06/14/18 1622 06/14/18 2326 06/15/18 0520 06/16/18 0406  WBC 10.6* 10.1 6.8 11.8*  NEUTROABS 5.9  --   --   --   HGB 12.7 11.9* 12.4 12.3  HCT 40.0 38.1 40.2 39.2  MCV 97.6 96.5 97.3 96.8  PLT 170 175 190 195   Cardiac Enzymes: Recent Labs  Lab 06/14/18 1622 06/14/18 2112 06/14/18 2314 06/15/18 1359  CKTOTAL  --   --  693*  --   TROPONINI 0.52* 0.30*  --  0.22*   BNP: Invalid input(s): POCBNP CBG: No results for input(s): GLUCAP in the last 168 hours. D-Dimer Recent Labs    06/14/18 1622  DDIMER 0.52*   Hgb A1c Recent Labs    06/14/18 2326  HGBA1C 5.5   Lipid Profile Recent Labs    06/14/18 2326  CHOL 165  HDL 64  LDLCALC 63  TRIG 191*  CHOLHDL 2.6   Thyroid function studies No results for input(s): TSH, T4TOTAL, T3FREE, THYROIDAB in the last 72 hours.  Invalid input(s): FREET3 Anemia work up No results for input(s): VITAMINB12, FOLATE, FERRITIN, TIBC, IRON, RETICCTPCT in  the last 72 hours. Urinalysis    Component Value Date/Time   COLORURINE STRAW (A) 01/09/2018 0025   APPEARANCEUR CLEAR 01/09/2018 0025   LABSPEC 1.009 01/09/2018 0025   PHURINE 5.0 01/09/2018 0025   GLUCOSEU NEGATIVE 01/09/2018 0025   HGBUR NEGATIVE 01/09/2018 0025   BILIRUBINUR NEGATIVE 01/09/2018 0025   KETONESUR NEGATIVE 01/09/2018 0025   PROTEINUR NEGATIVE 01/09/2018 0025   NITRITE NEGATIVE 01/09/2018 0025   LEUKOCYTESUR NEGATIVE 01/09/2018 0025   Sepsis Labs Invalid input(s): PROCALCITONIN,  WBC,  LACTICIDVEN Microbiology Recent Results (from the past 240 hour(s))  MRSA PCR Screening     Status: None   Collection Time: 06/15/18  2:52 PM  Result Value Ref Range Status   MRSA by PCR NEGATIVE NEGATIVE Final    Comment:        The GeneXpert MRSA Assay (FDA approved for NASAL specimens only), is one component of a comprehensive MRSA colonization surveillance program. It is not intended to diagnose MRSA infection nor to guide or monitor treatment for MRSA infections. Performed at [facility], [address], [place], [place] [address]      Time coordinating discharge: 33 minutes  SIGNED:   Alwyn Ren, MD  [facility] 06/16/2018, 12:44 PM Pager   If 7PM-7AM, please contact night-coverage [email or web address] Password [email or web address]

## 2018-06-16 NOTE — Progress Notes (Signed)
Subjective:  Feels tired.  No chest pain.  Shortness of breath improved.   Occasional PAC's, PVC's on telemetry  Objective:  Vital Signs in the last 24 hours: Temp:  [97.8 F (36.6 C)-98.4 F (36.9 C)] 98.4 F (36.9 C) (12/10 0808) Pulse Rate:  [70-95] 71 (12/10 0808) Resp:  [13-20] 17 (12/10 0808) BP: (128-206)/(65-111) 157/84 (12/10 0808) SpO2:  [94 %-99 %] 96 % (12/10 1131) Weight:  [91.7 kg] 91.7 kg (12/10 0552)  Intake/Output from previous day: 12/09 0701 - 12/10 0700 In: 1715.2 [P.O.:744; I.V.:941.8; IV Piggyback:29.5] Out: 1500 [Urine:1500] Intake/Output from this shift: Total I/O In: 240 [P.O.:240] Out: -   Physical Exam: Nursing note and vitals reviewed. Constitutional: She is oriented to person, place, and time. She appears well-developed and well-nourished. No distress.  HENT:  Head: Normocephalic and atraumatic.  Eyes: Pupils are equal, round, and reactive to light. Conjunctivae are normal.  Neck: No JVD present.  Cardiovascular: Normal rate and regular rhythm.  No murmur heard. Pulses:      Dorsalis pedis pulses are 1+ on the right side, and 1+ on the left side.       Posterior tibial pulses are 1+ on the right side, and 1+ on the left side.  Respiratory: Effort normal. She has wheezes.  GI: Soft. Bowel sounds are normal. There is no tenderness. There is no rebound.  Musculoskeletal: She exhibits no edema.  Neurological: She is alert and oriented to person, place, and time. She has normal reflexes.  Skin: Skin is warm and dry.  Psychiatric: She has a normal mood and affect.    Lab Results: Recent Labs    06/15/18 0520 06/16/18 0406  WBC 6.8 11.8*  HGB 12.4 12.3  PLT 190 195   Recent Labs    06/15/18 1449 06/16/18 0406  NA 137 138  K 3.9 4.1  CL 103 103  CO2 21* 25  GLUCOSE 197* 196*  BUN 28* 28*  CREATININE 1.27* 1.09*   Recent Labs    06/14/18 2112 06/15/18 1359  TROPONINI 0.30* 0.22*   Hepatic Function Panel No results for  input(s): PROT, ALBUMIN, AST, ALT, ALKPHOS, BILITOT, BILIDIR, IBILI in the last 72 hours. Recent Labs    06/14/18 2326  CHOL 165   Cardiac studies: EKG 06/09/2018: Sinus rhythm.  Normal axis.  Normal conduction.  Minimal ST elevation in lead V1, V2 not eating STEMI criteria.  Nonspecific inferolateral ST-T changes  Echocardiogram 06/15/2018:  - Left ventricle: The cavity size was normal. Wall thickness was normal. Systolic function was normal. The estimated ejection fraction was in the range of 55% to 65%. Wall motion was normal; there were no regional wall motion abnormalities. Doppler parameters are consistent with abnormal left ventricular relaxation (grade 1 diastolic dysfunction). - No significant valvular abnormality. - No evidence of pulmonary hypertension.  LM: Normal LAD: Mid 30% stenosis LCx: Normal RCA: Prox 40%, mid 60% stenoses. dFR 0.93 (Physiologically nonsignificant)  Nonobstructive coronary artery disease  LVEDP 26 mmHg   Recommendation: Medical management for NSTEMI. Recommend DAPT with aspirin and plavix for 1 year. May stop heparin. Elevated LVEDP. May benefit from lasix. Tobacco cessation and blood pressure control.   Assessment/Recommendations:  52 year old Caucasian female with hypertension, 50-pack-year smoker, COPD, family history of coronary artery disease, now admitted with COPD exacerbation and NSTEMI.  NSTEMI: Nonobstructive coronary artery disease on coronary angiogram 06/15/2018.  Recommend medical treatment for ACS with aspirin and Plavix for 1 year, Crestor 20 mg, aggressive blood pressure control.  Recommend  cardiac rehab.  Hypertension: Resume baseline lisinopril.  Started amlodipine 5 mg daily.  COPD exacerbation: Management as per primary team.  Tobacco abuse: Emphasized cessation. On nicotine patch  I will arrange outpatient follow up with Cardiology.  I will arrange outpatient follow-up.   LOS: 2 days     Michaele Amundson J Elisea Khader 06/16/2018, 11:35 AM  Otniel Hoe Emiliano DyerJ Shineka Auble, MD [facility] Pager: [phone] Office: [phone] If no answer Cell [phone]

## 2018-06-16 NOTE — Progress Notes (Signed)
Pt's bp 187/108. Pt stated she felt SOB after walking with cardiac rehab. 02 sat 95% lungs diminished. MD on call made aware. Will hold discharge until this afternoon. Will cont to monitor pt.

## 2018-06-16 NOTE — Progress Notes (Signed)
Removed TR band from right radial. Site is a level 0. Applied gauze and Tegaderm to site. Educated to leave in place for 24 hrs. Pt verbalized understanding. Will continue to monitor.

## 2018-06-17 ENCOUNTER — Inpatient Hospital Stay (HOSPITAL_COMMUNITY): Payer: Medicaid Other

## 2018-06-17 LAB — CBC
HCT: 41.4 % (ref 36.0–46.0)
Hemoglobin: 13.6 g/dL (ref 12.0–15.0)
MCH: 31 pg (ref 26.0–34.0)
MCHC: 32.9 g/dL (ref 30.0–36.0)
MCV: 94.3 fL (ref 80.0–100.0)
Platelets: 217 10*3/uL (ref 150–400)
RBC: 4.39 MIL/uL (ref 3.87–5.11)
RDW: 14.8 % (ref 11.5–15.5)
WBC: 14.7 10*3/uL — ABNORMAL HIGH (ref 4.0–10.5)
nRBC: 0 % (ref 0.0–0.2)

## 2018-06-17 LAB — BASIC METABOLIC PANEL
Anion gap: 11 (ref 5–15)
BUN: 32 mg/dL — AB (ref 6–20)
CHLORIDE: 103 mmol/L (ref 98–111)
CO2: 28 mmol/L (ref 22–32)
Calcium: 9.1 mg/dL (ref 8.9–10.3)
Creatinine, Ser: 1.18 mg/dL — ABNORMAL HIGH (ref 0.44–1.00)
GFR calc Af Amer: >60 mL/min (ref >60)
GFR calc non Af Amer: 53 mL/min — ABNORMAL LOW (ref >60)
Glucose, Bld: 158 mg/dL — ABNORMAL HIGH (ref 70–99)
Potassium: 4.6 mmol/L (ref 3.5–5.1)
SODIUM: 142 mmol/L (ref 135–145)

## 2018-06-17 LAB — MAGNESIUM: Magnesium: 2.2 mg/dL (ref 1.7–2.4)

## 2018-06-17 MED ORDER — HYDRALAZINE HCL 100 MG PO TABS: 100.0000 mg | ORAL_TABLET | Freq: Four times a day (QID) | ORAL | 1 refills | Status: DC

## 2018-06-17 MED ORDER — HYDRALAZINE HCL 50 MG PO TABS
100.0000 mg | ORAL_TABLET | Freq: Four times a day (QID) | ORAL | Status: DC
  Administered 2018-06-17 – 2018-06-18 (×5): 100 mg via ORAL
  Filled 2018-06-17 (×5): qty 2

## 2018-06-17 MED ORDER — IPRATROPIUM-ALBUTEROL 0.5-2.5 (3) MG/3ML IN SOLN
3.0000 mL | Freq: Three times a day (TID) | RESPIRATORY_TRACT | Status: DC
  Administered 2018-06-17 – 2018-06-18 (×4): 3 mL via RESPIRATORY_TRACT
  Filled 2018-06-17 (×4): qty 3

## 2018-06-17 MED ORDER — CLONIDINE HCL 0.2 MG PO TABS
0.2000 mg | ORAL_TABLET | Freq: Three times a day (TID) | ORAL | Status: DC
  Administered 2018-06-17 – 2018-06-18 (×2): 0.2 mg via ORAL
  Filled 2018-06-17 (×2): qty 1

## 2018-06-17 MED ORDER — FUROSEMIDE 40 MG PO TABS: 40.0000 mg | ORAL_TABLET | Freq: Every day | ORAL | 11 refills | Status: DC

## 2018-06-17 MED ORDER — LISINOPRIL 20 MG PO TABS
20.0000 mg | ORAL_TABLET | Freq: Every day | ORAL | Status: DC
  Administered 2018-06-17 – 2018-06-18 (×2): 20 mg via ORAL
  Filled 2018-06-17 (×2): qty 1

## 2018-06-17 MED ORDER — FUROSEMIDE 10 MG/ML IJ SOLN
60.0000 mg | Freq: Two times a day (BID) | INTRAMUSCULAR | Status: DC
  Administered 2018-06-18: 60 mg via INTRAVENOUS
  Filled 2018-06-17: qty 6

## 2018-06-17 NOTE — Progress Notes (Signed)
Subjective:  Still has shortness of breath No chest pain.  Occasional PVC's on telemetry  Discharge held yesterday due to uncontrolled hypertension. Home medication lisinopril not resumed yet. Receiving lasix 40 IV bid.  Objective:  Vital Signs in the last 24 hours: Temp:  [97.7 F (36.5 C)-98.3 F (36.8 C)] 98.1 F (36.7 C) (12/11 0759) Pulse Rate:  [82-94] 82 (12/11 0759) Resp:  [18-25] 18 (12/11 0759) BP: (160-196)/(81-109) 176/101 (12/11 0759) SpO2:  [94 %-96 %] 94 % (12/11 0434)  Intake/Output from previous day: 12/10 0701 - 12/11 0700 In: 600 [P.O.:600] Out: 1000 [Urine:1000] Intake/Output from this shift: Total I/O In: 240 [P.O.:240] Out: -   Physical Exam: Nursing note and vitals reviewed. Constitutional: She is oriented to person, place, and time. She appears well-developed and well-nourished. No distress.  HENT:  Head: Normocephalic and atraumatic.  Eyes: Pupils are equal, round, and reactive to light. Conjunctivae are normal.  Neck: No JVD present.  Cardiovascular: Normal rate and regular rhythm.  No murmur heard. Pulses:      Dorsalis pedis pulses are 1+ on the right side, and 1+ on the left side.       Posterior tibial pulses are 1+ on the right side, and 1+ on the left side.  Respiratory: Effort normal. She has wheezes.  GI: Soft. Bowel sounds are normal. There is no tenderness. There is no rebound.  Musculoskeletal: She exhibits no edema.  Neurological: She is alert and oriented to person, place, and time. She has normal reflexes.  Skin: Skin is warm and dry.  Psychiatric: She has a normal mood and affect.    Lab Results: Recent Labs    06/16/18 0406 06/17/18 0429  WBC 11.8* 14.7*  HGB 12.3 13.6  PLT 195 217   Recent Labs    06/16/18 0406 06/17/18 0429  NA 138 142  K 4.1 4.6  CL 103 103  CO2 25 28  GLUCOSE 196* 158*  BUN 28* 32*  CREATININE 1.09* 1.18*   Recent Labs    06/14/18 2112 06/15/18 1359  TROPONINI 0.30* 0.22*    Hepatic Function Panel No results for input(s): PROT, ALBUMIN, AST, ALT, ALKPHOS, BILITOT, BILIDIR, IBILI in the last 72 hours. Recent Labs    06/14/18 2326  CHOL 165   Cardiac studies: EKG 06/09/2018: Sinus rhythm.  Normal axis.  Normal conduction.  Minimal ST elevation in lead V1, V2 not eating STEMI criteria.  Nonspecific inferolateral ST-T changes  Echocardiogram 06/15/2018:  - Left ventricle: The cavity size was normal. Wall thickness was normal. Systolic function was normal. The estimated ejection fraction was in the range of 55% to 65%. Wall motion was normal; there were no regional wall motion abnormalities. Doppler parameters are consistent with abnormal left ventricular relaxation (grade 1 diastolic dysfunction). - No significant valvular abnormality. - No evidence of pulmonary hypertension.  LM: Normal LAD: Mid 30% stenosis LCx: Normal RCA: Prox 40%, mid 60% stenoses. dFR 0.93 (Physiologically nonsignificant)  Nonobstructive coronary artery disease  LVEDP 26 mmHg   Recommendation: Medical management for NSTEMI. Recommend DAPT with aspirin and plavix for 1 year. May stop heparin. Elevated LVEDP. May benefit from lasix. Tobacco cessation and blood pressure control.   Assessment/Recommendations:  52 year old Caucasian female with hypertension, 50-pack-year smoker, COPD, family history of coronary artery disease, now admitted with COPD exacerbation and NSTEMI.  NSTEMI: Nonobstructive coronary artery disease on coronary angiogram 06/15/2018.  Recommend medical treatment for ACS with aspirin and Plavix for 1 year, Crestor 20 mg, aggressive blood pressure  control.  Recommend cardiac rehab when able to participate.  Hypertension: Resume baseline lisinopril.  Continue amlodipine 5 mg daily. Agree with IV lasix. Could be switched to PO subject to clinical improvement  COPD exacerbation: Management as per primary team.  Tobacco  abuse: Emphasized cessation. On nicotine patch  I will arrange outpatient follow up with Cardiology.   LOS: 3 days    Cortnee Steinmiller J Keidan Aumiller 06/17/2018, 8:39 AM  Elder NegusManish J Bowen Kia, MD [facility] Pager: [phone] Office: [phone] If no answer Cell [phone]

## 2018-06-17 NOTE — Progress Notes (Signed)
Occupational Therapy Treatment Patient Details Name: Bethany Stuart MRN: [id number] DOB: Apr 19, 1966 Today's Date: 06/17/2018    History of present illness Patient is a 52 y/o female who presents with SOB, body aches, and right sided chest pain. EKG- new ST wave changes. Elevated troponin. CXR-Hyperexpansion suggests emphysema. PMH includes sciatica, HTN, COPD.    OT comments  Pt with BP 192/128 with basic transfer after seated adl task. Pt with DOE 3 out 4 with oxygen saturation with prolonged seating 89-92% RA. Pt reports headache that is generalized with peripheral "white" spots. Two visual assessments provided and pt is able to scan/ track to complete task without error. Pt able to complete simple money management questions 100% accurate. Energy conservation education initiated and will need continued demonstrations to help with home level management. Pt could benefit from DME to help with seated positioning.      Follow Up Recommendations  Home health OT    Equipment Recommendations  Tub/shower seat    Recommendations for Other Services      Precautions / Restrictions Precautions Precautions: Fall Precaution Comments: hx of sciatica and RLE giving out       Mobility Bed Mobility Overal bed mobility: Modified Independent             General bed mobility comments: hob elevated 50 degrees and exit on the right  Transfers Overall transfer level: Needs assistance   Transfers: Sit to/from Stand Sit to Stand: Supervision         General transfer comment: pt does reach for environmental support exiting the bathroom     Balance Overall balance assessment: Needs assistance Sitting-balance support: Bilateral upper extremity supported;Feet supported Sitting balance-Leahy Scale: Fair     Standing balance support: During functional activity;Single extremity supported Standing balance-Leahy Scale: Fair                             ADL either performed or  assessed with clinical judgement   ADL Overall ADL's : Needs assistance/impaired Eating/Feeding: Modified independent   Grooming: Wash/dry hands;Wash/dry face;Oral care;Set up;Sitting Grooming Details (indicate cue type and reason): pt requires seated position with multiple rest breaks for task. pt DOE 3 out 4     Lower Body Bathing: Moderate assistance;Sit to/from stand           Toilet Transfer: Lawyer and Hygiene: Min guard       Functional mobility during ADLs: Min guard General ADL Comments: pt demonstrates DOE 3 out 4 with bathroom to seated sink level with Oxygen levels 89% with increase RR. pt with HR 92. Pt educated on energy conservation with sitting. [name]     Vision       Perception     Praxis      Cognition Arousal/Alertness: Awake/alert Behavior During Therapy: WFL for tasks assessed/performed Overall Cognitive Status: Within Functional Limits for tasks assessed                                          Exercises     Shoulder Instructions       General Comments pt with elevated BP 192/128 after task at sink seated and transfer to EOB    Pertinent Vitals/ Pain  Pain Assessment: Faces Faces Pain Scale: Hurts little more Pain Location: back Pain Descriptors / Indicators: Discomfort Pain Intervention(s): Monitored during session;Repositioned  Home Living                                          Prior Functioning/Environment              Frequency  Min 2X/week        Progress Toward Goals  OT Goals(current goals can now be found in the care plan section)  Progress towards OT goals: Progressing toward goals  Acute Rehab OT Goals Patient Stated Goal: none stated OT Goal Formulation: With patient/family Time For Goal Achievement: 06/29/18 Potential to Achieve Goals: Good ADL  Goals Pt Will Perform Grooming: with modified independence;with caregiver independent in assisting Pt Will Perform Upper Body Bathing: with modified independence;with caregiver independent in assisting Pt Will Perform Lower Body Bathing: with modified independence;with caregiver independent in assisting;with adaptive equipment Pt Will Perform Upper Body Dressing: with modified independence;with caregiver independent in assisting Pt Will Perform Lower Body Dressing: with modified independence;with caregiver independent in assisting;with adaptive equipment Pt Will Transfer to Toilet: with modified independence;ambulating;regular height toilet Pt Will Perform Toileting - Clothing Manipulation and hygiene: with modified independence;sitting/lateral leans;sit to/from stand;with caregiver independent in assisting Pt Will Perform Tub/Shower Transfer: with supervision;with modified independence;ambulating;shower seat;with caregiver independent in assisting Additional ADL Goal #1: Pt will verbalize and demonstrate 3 EC techniques during ADLs and functional tasks  Plan Discharge plan remains appropriate    Co-evaluation                 AM-PAC OT "6 Clicks" Daily Activity     Outcome Measure   Help from another person eating meals?: None Help from another person taking care of personal grooming?: A Little Help from another person toileting, which includes using toliet, bedpan, or urinal?: A Little Help from another person bathing (including washing, rinsing, drying)?: A Little Help from another person to put on and taking off regular upper body clothing?: None Help from another person to put on and taking off regular lower body clothing?: A Little 6 Click Score: 20    End of Session    OT Visit Diagnosis: Unsteadiness on feet (R26.81);Muscle weakness (generalized) (M62.81)   Activity Tolerance Patient limited by fatigue   Patient Left in bed;with call bell/phone within reach;with  family/visitor present   Nurse Communication Mobility status;Precautions        Time: [id number] OT Time Calculation (min): 31 min  Charges: OT General Charges $OT Visit: 1 Visit OT Treatments $Self Care/Home Management : 23-37 mins   Mateo FlowBrynn Latiqua Daloia, OTR/L  Acute Rehabilitation Services Pager: [phone] Office: [phone] .    Mateo FlowBrynn Deslyn Cavenaugh 06/17/2018, 9:43 AM

## 2018-06-17 NOTE — Progress Notes (Signed)
PT Cancellation Note  Patient Details Name: Bethany MosherJulie Stuart MRN: [id number] DOB: 10/19/1965   Cancelled Treatment:    Reason Eval/Treat Not Completed: Other (comment);Medical issues which prohibited therapy. Chart reviewed, per recent cardiac rehab note and OT note, HTN still not controlled and outside of safe range to OOB activity. Will follow remotely and attempt treatment again at later date/time as appropriate.   10:15 AM, 06/17/18 Rosamaria LintsAllan C Buccola, PT, DPT Physical Therapist - [place] [phone] (Pager)  [phone] (Office)       Buccola,Allan C 06/17/2018, 10:14 AM

## 2018-06-17 NOTE — Progress Notes (Signed)
PROGRESS NOTE    Bethany Stuart  ZDG:[id number] DOB: 02-07-1966 DOA: 06/14/2018 PCP: System, Pcp Not In  Brief Narrative: 52 y.o.femalewithlumbar degenerative disc disease, HTN, sciatica and chronic back pain (prescribed PO opioids), COPD, hx of smoking who presented to ED with dyspnea and R sided chest pain. She had a mechanical fall (frequent falls due to R sided sciatica) onto her right side and has reports some lower R flank/chest discomfort. For past few days reports progressive non productive cough, URI sx and increased wheezing not responsive to home inhalers. Also was experiencing substernal to right sided upper chest pain, episodic sometimes worse with breathing. Reports having prior episodes of exertional chest pain as twinges in the past. Upon arrival to [facility] she had ambulated to the bathroom and had recurrence of R sided chest pain after ambulating that was not associated with respiration.  No prior cardiac workup. Reports both mother and brother had CAD including MI < 55 years.   Assessment & Plan:   Active Problems:   COPD exacerbation (HCC)   Chest pain   Hypokalemia   Hypomagnesemia  #1 non-ST elevation MI acute coronary syndrome patient admitted with chest pain found to have ST-T wave changes and elevated troponin.  She was treated with IV heparin, nitroglycerin drip.Echo showed-Left ventricle: The cavity size was normal. Wall thickness was normal. Systolic function was normal. The estimated ejection fraction was in the range of 55% to 65%. Wall motion was normal; there were no regional wall motion abnormalities. Doppler parameters are consistent with abnormal left ventricular relaxation (grade 1 diastolic dysfunction). - No significant valvular abnormality. - No evidence of pulmonary hypertension  Cath-LM: Normal LAD: Mid 30% stenosis LCx: Normal RCA: Prox 40%, mid 60% stenoses. dFR 0.93 (Physiologically nonsignificant)   continue aspirin and plavix for  one year.. Then continue only aspirin.  #2 COPD exacerbation-start prednisone taper  Continue Z-Pak,combivent  #3 AKI improved  #4 hypokalemia repleted and recheck.  #5 hypomagnesemia 1.8 repleted  #6  Hypertensive urgency patient's discharge was canceled yesterday due to persistent hypotension systolic above 180 and diastolic above 95.  She was started on Lasix 40 mg twice a day along with starting hydralazine 50 mg every 6 hours.  She was profoundly short of breath with ambulation associated with chest pain.      Nutrition Interventions:     Estimated body mass index is 31.65 kg/m as calculated from the following:   Height as of this encounter: 5\' 7"  (1.702 m).   Weight as of this encounter: 91.7 kg.  DVT prophylaxis: Lovenox Code Status full code Family Communication discussed with husband Disposition Plan: Cancel discharge aggressive treatment of hypertensive urgency overnight  Consultants: Cardiology  Procedures: Status post cath Antimicrobials:  Z-Pak  subjective: Complains of profound shortness of breath and dyspnea on exertion with lower extremity swelling   Objective: Vitals:   06/16/18 2053 06/16/18 2355 06/17/18 0434 06/17/18 0759  BP: (!) 177/82 (!) 160/81 (!) 162/95 (!) 176/101  Pulse: 94 92 86 82  Resp: (!) 25 (!) 25 (!) 21 18  Temp: 98.3 F (36.8 C) 98 F (36.7 C) 97.7 F (36.5 C) 98.1 F (36.7 C)  TempSrc: Oral Oral Oral Oral  SpO2: 96% 94% 94%   Weight:      Height:        Intake/Output Summary (Last 24 hours) at 06/17/2018 0848 Last data filed at 06/17/2018 0802 Gross per 24 hour  Intake 840 ml  Output 1000 ml  Net -160 ml  Filed Weights   06/14/18 1526 06/15/18 0401 06/16/18 0552  Weight: 79.4 kg 88.6 kg 91.7 kg    Examination:  General exam: Appears calm and comfortable  Respiratory system: Diffuse wheezing and crackles bilaterally to auscultation. Respiratory effort normal. Cardiovascular system: S1 & S2 heard, RRR.  No JVD, murmurs, rubs, gallops or clicks Gastrointestinal system: Abdomen is nondistended, soft and nontender. No organomegaly or masses felt. Normal bowel sounds heard. Central nervous system: Alert and oriented. No focal neurological deficits. Extremities: 2+ edema Skin: No rashes, lesions or ulcers Psychiatry: Judgement and insight appear normal. Mood & affect appropriate.     Data Reviewed: I have personally reviewed following labs and imaging studies  CBC: Recent Labs  Lab 06/14/18 1622 06/14/18 2326 06/15/18 0520 06/16/18 0406 06/17/18 0429  WBC 10.6* 10.1 6.8 11.8* 14.7*  NEUTROABS 5.9  --   --   --   --   HGB 12.7 11.9* 12.4 12.3 13.6  HCT 40.0 38.1 40.2 39.2 41.4  MCV 97.6 96.5 97.3 96.8 94.3  PLT 170 175 190 195 217   Basic Metabolic Panel: Recent Labs  Lab 06/14/18 1622 06/14/18 2326 06/15/18 1449 06/16/18 0406 06/17/18 0429  NA 137 137 137 138 142  K 2.7* 3.4* 3.9 4.1 4.6  CL 100 101 103 103 103  CO2 29 24 21* 25 28  GLUCOSE 132* 195* 197* 196* 158*  BUN 35* 31* 28* 28* 32*  CREATININE 1.28* 1.22* 1.27* 1.09* 1.18*  CALCIUM 9.2 9.0 9.2 8.8* 9.1  MG 2.2 1.8  --  2.7* 2.2   GFR: Estimated Creatinine Clearance: 64.1 mL/min (A) (by C-G formula based on SCr of 1.18 mg/dL (H)). Liver Function Tests: No results for input(s): AST, ALT, ALKPHOS, BILITOT, PROT, ALBUMIN in the last 168 hours. No results for input(s): LIPASE, AMYLASE in the last 168 hours. No results for input(s): AMMONIA in the last 168 hours. Coagulation Profile: No results for input(s): INR, PROTIME in the last 168 hours. Cardiac Enzymes: Recent Labs  Lab 06/14/18 1622 06/14/18 2112 06/14/18 2314 06/15/18 1359  CKTOTAL  --   --  693*  --   TROPONINI 0.52* 0.30*  --  0.22*   BNP (last 3 results) No results for input(s): PROBNP in the last 8760 hours. HbA1C: Recent Labs    06/14/18 2326  HGBA1C 5.5   CBG: No results for input(s): GLUCAP in the last 168 hours. Lipid  Profile: Recent Labs    06/14/18 2326  CHOL 165  HDL 64  LDLCALC 63  TRIG 191*  CHOLHDL 2.6   Thyroid Function Tests: No results for input(s): TSH, T4TOTAL, FREET4, T3FREE, THYROIDAB in the last 72 hours. Anemia Panel: No results for input(s): VITAMINB12, FOLATE, FERRITIN, TIBC, IRON, RETICCTPCT in the last 72 hours. Sepsis Labs: No results for input(s): PROCALCITON, LATICACIDVEN in the last 168 hours.  Recent Results (from the past 240 hour(s))  MRSA PCR Screening     Status: None   Collection Time: 06/15/18  2:52 PM  Result Value Ref Range Status   MRSA by PCR NEGATIVE NEGATIVE Final    Comment:        The GeneXpert MRSA Assay (FDA approved for NASAL specimens only), is one component of a comprehensive MRSA colonization surveillance program. It is not intended to diagnose MRSA infection nor to guide or monitor treatment for MRSA infections. Performed at [facility], [address], [place], [place] [address]          Radiology Studies: No  results found.      Scheduled Meds: . amLODipine  10 mg Oral Daily  . aspirin EC  81 mg Oral Daily  . azithromycin  250 mg Oral QHS  . clopidogrel  75 mg Oral Daily  . furosemide  40 mg Intravenous BID  . hydrALAZINE  100 mg Oral Q6H  . ipratropium-albuterol  3 mL Nebulization QID  . lisinopril  20 mg Oral Daily  . methylPREDNISolone (SOLU-MEDROL) injection  60 mg Intravenous Q6H  . nicotine  21 mg Transdermal Daily  . potassium chloride  20 mEq Oral BID  . rosuvastatin  20 mg Oral q1800  . sodium chloride flush  3 mL Intravenous Q12H   Continuous Infusions: . sodium chloride    . nitroGLYCERIN Stopped (06/15/18 1520)     LOS: 3 days     Alwyn RenElizabeth G Mathews, MD [facility] If 7PM-7AM, please contact night-coverage [email or web address] Password [email or web address] 06/17/2018, 8:48 AM

## 2018-06-17 NOTE — Progress Notes (Signed)
[facility]. Notified Dr Jerolyn CenterMathews that there is an order form that she needs to complete on front of shadow chart. This will need faxed when done.

## 2018-06-18 DIAGNOSIS — J9621 Acute and chronic respiratory failure with hypoxia: Secondary | ICD-10-CM

## 2018-06-18 LAB — CBC
HCT: 40.4 % (ref 36.0–46.0)
Hemoglobin: 13.3 g/dL (ref 12.0–15.0)
MCH: 30.6 pg (ref 26.0–34.0)
MCHC: 32.9 g/dL (ref 30.0–36.0)
MCV: 93.1 fL (ref 80.0–100.0)
Platelets: 222 10*3/uL (ref 150–400)
RBC: 4.34 MIL/uL (ref 3.87–5.11)
RDW: 15.1 % (ref 11.5–15.5)
WBC: 15.7 10*3/uL — ABNORMAL HIGH (ref 4.0–10.5)
nRBC: 0.1 % (ref 0.0–0.2)

## 2018-06-18 LAB — BASIC METABOLIC PANEL
Anion gap: 11 (ref 5–15)
BUN: 42 mg/dL — ABNORMAL HIGH (ref 6–20)
CO2: 30 mmol/L (ref 22–32)
Calcium: 9.1 mg/dL (ref 8.9–10.3)
Chloride: 101 mmol/L (ref 98–111)
Creatinine, Ser: 1.41 mg/dL — ABNORMAL HIGH (ref 0.44–1.00)
GFR calc Af Amer: 49 mL/min — ABNORMAL LOW (ref >60)
GFR calc non Af Amer: 42 mL/min — ABNORMAL LOW (ref >60)
Glucose, Bld: 168 mg/dL — ABNORMAL HIGH (ref 70–99)
Potassium: 4.6 mmol/L (ref 3.5–5.1)
Sodium: 142 mmol/L (ref 135–145)

## 2018-06-18 LAB — MAGNESIUM: Magnesium: 2.2 mg/dL (ref 1.7–2.4)

## 2018-06-18 MED ORDER — CLONIDINE HCL 0.1 MG PO TABS: 0.1000 mg | ORAL_TABLET | Freq: Two times a day (BID) | ORAL | 11 refills | Status: DC

## 2018-06-18 NOTE — Progress Notes (Signed)
PT Cancellation Note  Patient Details Name: Bethany MosherJulie Goetting MRN: [id number] DOB: 01/15/1966   Cancelled Treatment:    Reason Eval/Treat Not Completed: (Just walked with nurse.  Refused PT.  )   Amadeo GarnetDawn F Nishika Parkhurst 06/18/2018, 11:29 AM  Eber Jonesawn Rocco Kerkhoff,PT Acute Rehabilitation Services Pager:  [phone]  Office:  [phone]

## 2018-06-18 NOTE — Progress Notes (Signed)
Patient ambulated 250 feet on room air, oxygen saturation ranged from 93-95 % with ambulation.  Patient stated she felt SOB, encouraged to stop and rest frequently.  Gait steady, stand by assist only.

## 2018-06-18 NOTE — Progress Notes (Signed)
Faxed orders for outpatient PT OT and gave Rx to patient.

## 2018-06-18 NOTE — Progress Notes (Signed)
PROGRESS NOTE    Bethany Stuart  ZOX:[id number] DOB: 12/13/1965 DOA: 06/14/2018 PCP: System, Pcp Not In    Brief Narrative: 52 y.o.femalewithlumbar degenerative disc disease, HTN, sciatica and chronic back pain (prescribed PO opioids), COPD, hx of smoking who presented to ED with dyspnea and R sided chest pain. She had a mechanical fall (frequent falls due to R sided sciatica) onto her right side and has reports some lower R flank/chest discomfort. For past few days reports progressive non productive cough, URI sx and increased wheezing not responsive to home inhalers. Also was experiencing substernal to right sided upper chest pain, episodic sometimes worse with breathing. Reports having prior episodes of exertional chest pain as twinges in the past. [facility] had ambulated to the bathroom and had recurrence of R sided chest pain after ambulating that was not associated with respiration.  No prior cardiac workup. Reports both mother and brother had CAD including MI <55 years.  Assessment & Plan:   Active Problems:   COPD exacerbation (HCC)   Chest pain   Hypokalemia   Hypomagnesemia   Acute on chronic respiratory failure with hypoxia (HCC)  #1 non-ST elevation MI acute coronary syndrome patient admitted with chest pain found to have ST-T wave changes and elevated troponin. She was treated with IV heparin, nitroglycerin drip.Echo showed-Left ventricle: The cavity size was normal. Wall thickness was normal. Systolic function was normal. The estimated ejection fraction was in the range of 55% to 65%. Wall motion was normal; there were no regional wall motion abnormalities. Doppler parameters are consistent with abnormal left ventricular relaxation (grade 1 diastolic dysfunction). - No significant valvular abnormality. - No evidence of pulmonary hypertension  Cath-LM: Normal LAD: Mid 30% stenosis LCx: Normal RCA: Prox 40%, mid 60% stenoses. dFR 0.93  (Physiologically nonsignificant)  continue aspirin and plavix for one year.. Then continue only aspirin.  #2 COPD exacerbation-start prednisone taperContinue Z-Pak,combivent  #3 AKI improved  #4 hypokalemia repleted and recheck.   #5 hypomagnesemia 1.8 repleted  #6  Hypertensive urgency patient's discharge was canceled yesterday due to persistent hypotension systolic above 180 and diastolic above 95.  Lasix increased to 60 mg twice a day with hydralazine 100 mg 3 times a day and clonidine will be added 0.2 mg 3 times a day.  She was profoundly short of breath with ambulation associated with chest pain.  Saturation dropped to low 80s upon ambulation.   Estimated body mass index is 29.79 kg/m as calculated from the following:   Height as of this encounter: 5\' 7"  (1.702 m).   Weight as of this encounter: 86.3 kg.  DVT prophylaxis: Lovenox  Code Status: Full code Family Communication: Discussed with husband Disposition Plan:  Pending improvement of her blood pressure as well as hypoxia patient did not have oxygen prior to admission to the hospital. Consultants:  Cardiology Procedures: Cath Antimicrobials Z-Pak  Subjective: Still feeling short of breath and dyspnea on exertion and swelling in the lower extremities.  Objective: Vitals:   06/18/18 0403 06/18/18 0540 06/18/18 0748 06/18/18 0753  BP: (!) 176/82 (!) 168/91  (!) 142/90  Pulse: 84   80  Resp: 16   16  Temp: 97.9 F (36.6 C)   (!) 96.6 F (35.9 C)  TempSrc: Oral   Axillary  SpO2: 96%  98% 99%  Weight: 86.3 kg     Height:        Intake/Output Summary (Last 24 hours) at 06/18/2018 1115 Last data filed at 06/18/2018 1035  Gross per 24 hour  Intake 1316 ml  Output 5900 ml  Net -4584 ml   Filed Weights   06/15/18 0401 06/16/18 0552 06/18/18 0403  Weight: 88.6 kg 91.7 kg 86.3 kg    Examination:  General exam: Appears calm and comfortable  Respiratory system: Decreased breath sounds at the bases  with scattered diffuse wheezing.  To auscultation. Respiratory effort normal. Cardiovascular system: S1 & S2 heard, RRR. No JVD, murmurs, rubs, gallops or clicks. No pedal edema. Gastrointestinal system: Abdomen is nondistended, soft and nontender. No organomegaly or masses felt. Normal bowel sounds heard. Central nervous system: Alert and oriented. No focal neurological deficits. Extremities: 1+ edema bilaterally. Skin: No rashes, lesions or ulcers Psychiatry: Judgement and insight appear normal. Mood & affect appropriate.     Data Reviewed: I have personally reviewed following labs and imaging studies  CBC: Recent Labs  Lab 06/14/18 1622 06/14/18 2326 06/15/18 0520 06/16/18 0406 06/17/18 0429 06/18/18 0335  WBC 10.6* 10.1 6.8 11.8* 14.7* 15.7*  NEUTROABS 5.9  --   --   --   --   --   HGB 12.7 11.9* 12.4 12.3 13.6 13.3  HCT 40.0 38.1 40.2 39.2 41.4 40.4  MCV 97.6 96.5 97.3 96.8 94.3 93.1  PLT 170 175 190 195 217 222   Basic Metabolic Panel: Recent Labs  Lab 06/14/18 1622 06/14/18 2326 06/15/18 1449 06/16/18 0406 06/17/18 0429 06/18/18 0335  NA 137 137 137 138 142 142  K 2.7* 3.4* 3.9 4.1 4.6 4.6  CL 100 101 103 103 103 101  CO2 29 24 21* 25 28 30   GLUCOSE 132* 195* 197* 196* 158* 168*  BUN 35* 31* 28* 28* 32* 42*  CREATININE 1.28* 1.22* 1.27* 1.09* 1.18* 1.41*  CALCIUM 9.2 9.0 9.2 8.8* 9.1 9.1  MG 2.2 1.8  --  2.7* 2.2 2.2   GFR: Estimated Creatinine Clearance: 52.1 mL/min (A) (by C-G formula based on SCr of 1.41 mg/dL (H)). Liver Function Tests: No results for input(s): AST, ALT, ALKPHOS, BILITOT, PROT, ALBUMIN in the last 168 hours. No results for input(s): LIPASE, AMYLASE in the last 168 hours. No results for input(s): AMMONIA in the last 168 hours. Coagulation Profile: No results for input(s): INR, PROTIME in the last 168 hours. Cardiac Enzymes: Recent Labs  Lab 06/14/18 1622 06/14/18 2112 06/14/18 2314 06/15/18 1359  CKTOTAL  --   --  693*  --     TROPONINI 0.52* 0.30*  --  0.22*   BNP (last 3 results) No results for input(s): PROBNP in the last 8760 hours. HbA1C: No results for input(s): HGBA1C in the last 72 hours. CBG: No results for input(s): GLUCAP in the last 168 hours. Lipid Profile: No results for input(s): CHOL, HDL, LDLCALC, TRIG, CHOLHDL, LDLDIRECT in the last 72 hours. Thyroid Function Tests: No results for input(s): TSH, T4TOTAL, FREET4, T3FREE, THYROIDAB in the last 72 hours. Anemia Panel: No results for input(s): VITAMINB12, FOLATE, FERRITIN, TIBC, IRON, RETICCTPCT in the last 72 hours. Sepsis Labs: No results for input(s): PROCALCITON, LATICACIDVEN in the last 168 hours.  Recent Results (from the past 240 hour(s))  MRSA PCR Screening     Status: None   Collection Time: 06/15/18  2:52 PM  Result Value Ref Range Status   MRSA by PCR NEGATIVE NEGATIVE Final    Comment:        The GeneXpert MRSA Assay (FDA approved for NASAL specimens only), is one component of a comprehensive MRSA colonization surveillance program. It is not  intended to diagnose MRSA infection nor to guide or monitor treatment for MRSA infections. Performed at [facility], [address] [place], [place] [address]          Radiology Studies: Dg Chest 1 View  Result Date: 06/17/2018 CLINICAL DATA:  Weakness and shortness of breath. Acute on chronic respiratory failure with hypoxia, COPD, current smoker. EXAM: CHEST  1 VIEW COMPARISON:  Chest x-ray and chest CT scan of June 14, 2018 FINDINGS: The lungs remain hyperinflated. There is increased density just above the right hemidiaphragm on today's study. The left lung is clear. The heart and pulmonary vascularity are normal. The mediastinum is normal in width. The trachea is midline. The bony thorax is unremarkable. IMPRESSION: Probable subsegmental atelectasis or developing pneumonia at the right lung base superimposed upon COPD. No CHF. Electronically Signed   By: David   [place] M.D.   On: 06/17/2018 12:07        Scheduled Meds: . amLODipine  10 mg Oral Daily  . aspirin EC  81 mg Oral Daily  . azithromycin  250 mg Oral QHS  . cloNIDine  0.2 mg Oral TID  . clopidogrel  75 mg Oral Daily  . furosemide  60 mg Intravenous BID  . hydrALAZINE  100 mg Oral Q6H  . ipratropium-albuterol  3 mL Nebulization TID  . lisinopril  20 mg Oral Daily  . methylPREDNISolone (SOLU-MEDROL) injection  60 mg Intravenous Q6H  . nicotine  21 mg Transdermal Daily  . potassium chloride  20 mEq Oral BID  . rosuvastatin  20 mg Oral q1800  . sodium chloride flush  3 mL Intravenous Q12H   Continuous Infusions: . sodium chloride    . nitroGLYCERIN Stopped (06/15/18 1520)     LOS: 4 days      Alwyn Ren, MD [facility] If 7PM-7AM, please contact night-coverage [email or web address] Password [email or web address] 06/18/2018, 11:15 AM

## 2018-12-01 DIAGNOSIS — E876 Hypokalemia: Secondary | ICD-10-CM

## 2018-12-01 DIAGNOSIS — M549 Dorsalgia, unspecified: Secondary | ICD-10-CM | POA: Diagnosis not present

## 2018-12-01 DIAGNOSIS — I160 Hypertensive urgency: Secondary | ICD-10-CM

## 2018-12-01 DIAGNOSIS — R079 Chest pain, unspecified: Secondary | ICD-10-CM

## 2018-12-01 DIAGNOSIS — I252 Old myocardial infarction: Secondary | ICD-10-CM

## 2018-12-01 DIAGNOSIS — J449 Chronic obstructive pulmonary disease, unspecified: Secondary | ICD-10-CM

## 2018-12-02 DIAGNOSIS — J449 Chronic obstructive pulmonary disease, unspecified: Secondary | ICD-10-CM | POA: Diagnosis not present

## 2018-12-02 DIAGNOSIS — I10 Essential (primary) hypertension: Secondary | ICD-10-CM

## 2018-12-02 DIAGNOSIS — Z720 Tobacco use: Secondary | ICD-10-CM

## 2018-12-02 DIAGNOSIS — Z9119 Patient's noncompliance with other medical treatment and regimen: Secondary | ICD-10-CM

## 2018-12-02 DIAGNOSIS — I160 Hypertensive urgency: Secondary | ICD-10-CM | POA: Diagnosis not present

## 2018-12-02 DIAGNOSIS — M549 Dorsalgia, unspecified: Secondary | ICD-10-CM | POA: Diagnosis not present

## 2019-03-07 ENCOUNTER — Emergency Department (HOSPITAL_BASED_OUTPATIENT_CLINIC_OR_DEPARTMENT_OTHER)
Admission: EM | Admit: 2019-03-07 | Discharge: 2019-03-08 | Disposition: A | Discharge status: Home / Self Care | Payer: Medicaid Other | Attending: Emergency Medicine | Admitting: Emergency Medicine

## 2019-03-07 ENCOUNTER — Emergency Department (HOSPITAL_BASED_OUTPATIENT_CLINIC_OR_DEPARTMENT_OTHER): Payer: Medicaid Other

## 2019-03-07 ENCOUNTER — Encounter (HOSPITAL_BASED_OUTPATIENT_CLINIC_OR_DEPARTMENT_OTHER): Payer: Self-pay

## 2019-03-07 ENCOUNTER — Other Ambulatory Visit: Payer: Self-pay

## 2019-03-07 DIAGNOSIS — Z7902 Long term (current) use of antithrombotics/antiplatelets: Secondary | ICD-10-CM | POA: Diagnosis not present

## 2019-03-07 DIAGNOSIS — R2241 Localized swelling, mass and lump, right lower limb: Secondary | ICD-10-CM | POA: Diagnosis present

## 2019-03-07 DIAGNOSIS — Z79899 Other long term (current) drug therapy: Secondary | ICD-10-CM | POA: Diagnosis not present

## 2019-03-07 DIAGNOSIS — Y929 Unspecified place or not applicable: Secondary | ICD-10-CM | POA: Diagnosis not present

## 2019-03-07 DIAGNOSIS — R600 Localized edema: Secondary | ICD-10-CM

## 2019-03-07 DIAGNOSIS — I10 Essential (primary) hypertension: Secondary | ICD-10-CM | POA: Insufficient documentation

## 2019-03-07 DIAGNOSIS — X58XXXA Exposure to other specified factors, initial encounter: Secondary | ICD-10-CM | POA: Insufficient documentation

## 2019-03-07 DIAGNOSIS — S80821A Blister (nonthermal), right lower leg, initial encounter: Secondary | ICD-10-CM

## 2019-03-07 DIAGNOSIS — Z7982 Long term (current) use of aspirin: Secondary | ICD-10-CM | POA: Diagnosis not present

## 2019-03-07 DIAGNOSIS — L03115 Cellulitis of right lower limb: Secondary | ICD-10-CM | POA: Diagnosis not present

## 2019-03-07 DIAGNOSIS — Y999 Unspecified external cause status: Secondary | ICD-10-CM | POA: Insufficient documentation

## 2019-03-07 DIAGNOSIS — J449 Chronic obstructive pulmonary disease, unspecified: Secondary | ICD-10-CM | POA: Insufficient documentation

## 2019-03-07 DIAGNOSIS — Y939 Activity, unspecified: Secondary | ICD-10-CM | POA: Diagnosis not present

## 2019-03-07 HISTORY — DX: Depression, unspecified: F32.A

## 2019-03-07 MED ORDER — HYDROCODONE-ACETAMINOPHEN 5-325 MG PO TABS
2.0000 | ORAL_TABLET | Freq: Once | ORAL | Status: AC
  Administered 2019-03-07: 2 via ORAL
  Filled 2019-03-07: qty 2

## 2019-03-07 MED ORDER — HYDROCODONE-ACETAMINOPHEN 5-325 MG PO TABS
1.0000 | ORAL_TABLET | Freq: Once | ORAL | Status: AC
  Administered 2019-03-07: 1 via ORAL
  Filled 2019-03-07: qty 1

## 2019-03-07 MED ORDER — DOXYCYCLINE HYCLATE 100 MG PO CAPS: 100.0000 mg | ORAL_CAPSULE | Freq: Two times a day (BID) | ORAL | 0 refills | Status: AC

## 2019-03-07 MED ORDER — LIDOCAINE-EPINEPHRINE-TETRACAINE (LET) SOLUTION
3.0000 mL | Freq: Once | NASAL | Status: AC
  Administered 2019-03-07: 3 mL via TOPICAL
  Filled 2019-03-07: qty 3

## 2019-03-07 NOTE — ED Triage Notes (Signed)
Pt c/o R leg swelling x 1 week. Pt was dx with cellulitis by PCP and was given an abx. Pt states that it is not getting any better. Pt has taken 2 days worth of abx.

## 2019-03-07 NOTE — Discharge Instructions (Signed)
You have been diagnosed today with cellulitis and blister of the right lower extremity and chronic bilateral leg swelling.  At this time there does not appear to be the presence of an emergent medical condition, however there is always the potential for conditions to change. Please read and follow the below instructions.  Please return to the Emergency Department immediately for any new or worsening symptoms. Please be sure to follow up with your Primary Care Provider within one week regarding your visit today; please call their office to schedule an appointment even if you are feeling better for a follow-up visit. Please take the new antibiotic doxycycline as prescribed for treatment of infection.  You may stop taking the Bactrim previously prescribed as the new medication doxycycline will replace it. Please be sure to take all of your home medications as instructed by your primary care provider including your diuretic to help with your leg swelling.  Elevate your legs daily to help with swelling.  Please go to your primary care doctor's appointment tomorrow morning for reevaluation.  Get help right away if: Your symptoms get worse. You feel very sleepy. You throw up (vomit) or have watery poop (diarrhea) for a long time. You see red streaks coming from the area. Your red area gets larger. You have fever or chills Your red area turns dark in color. You have chest pain or shortness of breath You have cough You have any new/concerning or worsening symptoms  Please read the additional information packets attached to your discharge summary.  Do not take your medicine if  develop an itchy rash, swelling in your mouth or lips, or difficulty breathing; call 911 and seek immediate emergency medical attention if this occurs.

## 2019-03-07 NOTE — ED Provider Notes (Signed)
[facility] EMERGENCY DEPARTMENT Provider Note   CSN: [id number] Arrival date & time: 03/07/19  1314     History   Chief Complaint Chief Complaint  Patient presents with  . Leg Swelling    HPI Bethany Stuart is a 53 y.o. female with history of MI, COPD, hypertension, chronic bilateral lower extremity edema on Lasix presents today for pain and swelling of the right lower leg.  Patient reports that she has had 1 week of pain and swelling with color change.  She denies any known injury or bite to the area.  Patient reports increasing burning pain is now moderate in intensity constant worsened with palpation and movement and without alleviating factor.  Patient was seen 2 days ago by PCP and diagnosed with cellulitis, started on Bactrim and has had a total of 4 doses.  She states that Bactrim has not helped her as of yet.  Patient reports that she has had a small dark bubble arise in the middle of her swelling and redness over the past 2 days.  Patient denies fever/chills, nausea/vomiting, chest pain/shortness of breath, cough, abdominal pain, numbness/weakness, tingling or any additional concerns.     HPI  Past Medical History:  Diagnosis Date  . Bulging lumbar disc   . COPD (chronic obstructive pulmonary disease) ([place])   . Depression   . Hypertension   . Sciatica     Patient Active Problem List   Diagnosis Date Noted  . Acute on chronic respiratory failure with hypoxia ([place])   . Hypokalemia 06/15/2018  . Hypomagnesemia 06/15/2018  . COPD exacerbation ([place]) 06/14/2018  . Chest pain 06/14/2018    Past Surgical History:  Procedure Laterality Date  . ABDOMINAL HYSTERECTOMY    . APPENDECTOMY    . CHOLECYSTECTOMY    . FOOT SURGERY    . INTRAVASCULAR PRESSURE WIRE/FFR STUDY N/A 06/15/2018   Procedure: INTRAVASCULAR PRESSURE WIRE/FFR STUDY;  Surgeon: Nigel Mormon, MD;  Location: [facility];  Service: Cardiovascular;  Laterality: N/A;  . LEFT HEART CATH  AND CORONARY ANGIOGRAPHY N/A 06/15/2018   Procedure: LEFT HEART CATH AND CORONARY ANGIOGRAPHY;  Surgeon: Nigel Mormon, MD;  Location: [facility];  Service: Cardiovascular;  Laterality: N/A;     OB History   No obstetric history on file.      Home Medications    Prior to Admission medications   Medication Sig Start Date End Date Taking? Authorizing Provider  amLODipine (NORVASC) 10 MG tablet Take 1 tablet (10 mg total) by mouth daily. 06/17/18   Georgette Shell, MD  aspirin EC 81 MG EC tablet Take 1 tablet (81 mg total) by mouth daily. 06/17/18   Georgette Shell, MD  azithromycin [facility]) 250 MG tablet Take 1 tablet daily for 5 days 06/16/18   Georgette Shell, MD  cloNIDine (CATAPRES) 0.1 MG tablet Take 1 tablet (0.1 mg total) by mouth 2 (two) times daily. 06/18/18 06/18/19  Georgette Shell, MD  clopidogrel (PLAVIX) 75 MG tablet Take 1 tablet (75 mg total) by mouth daily. Continue aspirin and Plavix together for 1 year 06/17/18   Georgette Shell, MD  doxycycline (VIBRAMYCIN) 100 MG capsule Take 1 capsule (100 mg total) by mouth 2 (two) times daily for 7 days. 03/07/19 03/14/19  Nuala Alpha A, PA-C  furosemide (LASIX) 40 MG tablet Take 1 tablet (40 mg total) by mouth daily. 06/17/18 06/17/19  Georgette Shell, MD  hydrALAZINE (APRESOLINE) 100 MG tablet Take 1 tablet (  100 mg total) by mouth every 6 (six) hours. 06/17/18   Alwyn Ren, MD  lisinopril (PRINIVIL,ZESTRIL) 20 MG tablet Take 20 mg by mouth daily.    [provider]  nicotine (NICODERM CQ - DOSED IN MG/24 HOURS) 21 mg/24hr patch Place 1 patch (21 mg total) onto the skin daily. 06/17/18   Alwyn Ren, MD  oxyCODONE-acetaminophen (PERCOCET/ROXICET) 5-325 MG tablet Take 1 tablet by mouth every 12 (twelve) hours as needed for severe pain. 04/12/18   Derwood Kaplan, MD  predniSONE (DELTASONE) 10 MG tablet Take 4 tablets for the first 3 days then 3 tablets for the  next 3 days then 2 tablets for the following 3 days and then 1 tablet daily till done 06/16/18   Alwyn Ren, MD  rosuvastatin (CRESTOR) 20 MG tablet Take 1 tablet (20 mg total) by mouth daily at 6 PM. 06/16/18   Alwyn Ren, MD    Family History No family history on file.  Social History Social History   Tobacco Use  . Smoking status: Current Every Day Smoker  . Smokeless tobacco: Never Used  Substance Use Topics  . Alcohol use: Yes  . Drug use: No     Allergies   Fioricet [butalbital-apap-caffeine], Klonopin [clonazepam], and Penicillins   Review of Systems Review of Systems Ten systems are reviewed and are negative for acute change except as noted in the HPI  Physical Exam Updated Vital Signs BP (!) 163/96 (BP Location: Right Arm)   Pulse 88   Temp 99.9 F (37.7 C) (Oral)   Resp 18   Ht 5\' 7"  (1.702 m)   Wt 87.1 kg   SpO2 94%   BMI 30.07 kg/m   Physical Exam Constitutional:      General: She is not in acute distress.    Appearance: Normal appearance. She is well-developed. She is not ill-appearing or diaphoretic.  HENT:     Head: Normocephalic and atraumatic.     Right Ear: External ear normal.     Left Ear: External ear normal.     Nose: Nose normal.  Eyes:     General: Vision grossly intact. Gaze aligned appropriately.     Pupils: Pupils are equal, round, and reactive to light.  Neck:     Musculoskeletal: Normal range of motion.     Trachea: Trachea and phonation normal. No tracheal deviation.  Cardiovascular:     Rate and Rhythm: Normal rate and regular rhythm.     Pulses:          Dorsalis pedis pulses are 2+ on the right side and 2+ on the left side.  Pulmonary:     Effort: Pulmonary effort is normal. No accessory muscle usage or respiratory distress.     Breath sounds: Normal air entry.  Abdominal:     General: There is no distension.     Palpations: Abdomen is soft.     Tenderness: There is no abdominal tenderness. There is  no guarding or rebound.  Musculoskeletal: Normal range of motion.     Right lower leg: 1+ Edema present.     Left lower leg: 1+ Edema present.  Feet:     Right foot:     Protective Sensation: 3 sites tested. 3 sites sensed.     Left foot:     Protective Sensation: 3 sites tested. 3 sites sensed.  Skin:    General: Skin is warm and dry.  Neurological:     Mental Status: She is  alert.     GCS: GCS eye subscore is 4. GCS verbal subscore is 5. GCS motor subscore is 6.     Comments: Speech is clear and goal oriented, follows commands Major Cranial nerves without deficit, no facial droop Normal strength in upper and lower extremities bilaterally including dorsiflexion and plantar flexion, strong and equal grip strength Sensation normal to light and sharp touch Moves extremities without ataxia, coordination intact  Psychiatric:        Behavior: Behavior normal.       ED Treatments / Results  Labs (all labs ordered are listed, but only abnormal results are displayed) Labs Reviewed  AEROBIC/ANAEROBIC CULTURE (SURGICAL/DEEP WOUND)    EKG None  Radiology Dg Ankle Complete Right  Result Date: 03/07/2019 CLINICAL DATA:  Pain and swelling. EXAM: RIGHT ANKLE - COMPLETE 3+ VIEW COMPARISON:  March 05, 2019 FINDINGS: The patient is status post ORIF of the distal fibula and tibia. The hardware appears grossly intact. There may be some mild loosening of the syndesmotic screw. There is nonspecific soft tissue swelling about the ankle. There are degenerative changes of the mortise joint. There is a soft tissue density anterior to the distal tibia, stable from prior study. IMPRESSION: 1. No acute displaced fracture or dislocation. 2. Nonspecific soft tissue swelling about the ankle. Electronically Signed   By: Katherine Mantlehristopher  Green M.D.   On: 03/07/2019 16:22   [place] Venous Img Lower Right (dvt Study)  Result Date: 03/07/2019 CLINICAL DATA:  Lower extremity swelling EXAM: RIGHT LOWER EXTREMITY VENOUS  DOPPLER ULTRASOUND TECHNIQUE: Gray-scale sonography with graded compression, as well as color Doppler and duplex ultrasound were performed to evaluate the lower extremity deep venous systems from the level of the common femoral vein and including the common femoral, femoral, profunda femoral, popliteal and calf veins including the posterior tibial, peroneal and gastrocnemius veins when visible. The superficial great saphenous vein was also interrogated. Spectral Doppler was utilized to evaluate flow at rest and with distal augmentation maneuvers in the common femoral, femoral and popliteal veins. COMPARISON:  None. FINDINGS: Contralateral Common Femoral Vein: Respiratory phasicity is normal and symmetric with the symptomatic side. No evidence of thrombus. Normal compressibility. Common Femoral Vein: No evidence of thrombus. Normal compressibility, respiratory phasicity and response to augmentation. Saphenofemoral Junction: No evidence of thrombus. Normal compressibility and flow on color Doppler imaging. Profunda Femoral Vein: No evidence of thrombus. Normal compressibility and flow on color Doppler imaging. Femoral Vein: No evidence of thrombus. Normal compressibility, respiratory phasicity and response to augmentation. Popliteal Vein: No evidence of thrombus. Normal compressibility, respiratory phasicity and response to augmentation. Calf Veins: No evidence of thrombus. Normal compressibility and flow on color Doppler imaging. The peroneal vein was not visualized. Superficial Great Saphenous Vein: No evidence of thrombus. Normal compressibility. Venous Reflux:  None. Other Findings: There is nonspecific lower extremity soft tissue edema. IMPRESSION: 1. No DVT. 2. Nonspecific lower extremity edema. Electronically Signed   By: Katherine Mantlehristopher  Green M.D.   On: 03/07/2019 16:24    Procedures .Marland Kitchen.Incision and Drainage  Date/Time: 03/07/2019 6:04 PM Performed by: Bill SalinasMorelli, Aspynn Clover A, PA-C Authorized by: Bill SalinasMorelli,  Wendee Hata A, PA-C   Consent:    Consent obtained:  Verbal   Consent given by:  Patient   Risks discussed:  Bleeding, incomplete drainage, pain, infection and damage to other organs Location:    Type:  Fluid collection   Size:  1cm   Location:  Lower extremity   Lower extremity location:  Ankle   Ankle location:  R ankle Pre-procedure details:    Skin preparation:  Betadine Anesthesia (see MAR for exact dosages):    Anesthesia method:  Topical application   Topical anesthetic:  LET Procedure type:    Complexity:  Simple Procedure details:    Incision types:  Single straight   Scalpel blade:  11   Drainage:  Serosanguinous   Drainage amount:  Scant Post-procedure details:    Patient tolerance of procedure:  Tolerated well, no immediate complications Comments:     Wound dressed by nursing staff.   (including critical care time)  Medications Ordered in ED Medications  HYDROcodone-acetaminophen (NORCO/VICODIN) 5-325 MG per tablet 2 tablet (2 tablets Oral Given 03/07/19 1515)  lidocaine-EPINEPHrine-tetracaine (LET) solution (3 mLs Topical Given 03/07/19 1750)     Initial Impression / Assessment and Plan / ED Course  I have reviewed the triage vital signs and the nursing notes.  Pertinent labs & imaging results that were available during my care of the patient were reviewed by me and considered in my medical decision making (see chart for details).    US DVT Right:  IMPRESSION:  1. No DVT.  2. Nonspecific lower extremity edema.   DG Right Ankle: IMPRESSION:  1. No acute displaced fracture or dislocation.  2. Nonspecific soft tissue swelling about the ankle.  - Discussed case and reviewed with Dr. Anitra LauthPlunkett.  Plan of care at this time is incision and drainage of likely infected fluid collection with wound culture.  Change patient from Bactrim to doxycycline, patient to follow-up with primary care provider next week. - Incision and drainage tolerated well, fluid collected  and sent for culture.  Additionally patient instructed to take home diuretic as prescribed and elevate her legs often to help with swelling, doxycycline twice daily for infection.  She reports she has a primary care doctor's appointment tomorrow morning at 9 AM I have encouraged her to go to this appointment for reevaluation.  Discussed wound care with patient and her husband and they state understanding.  At this time there does not appear to be any evidence of an acute emergency medical condition and the patient appears stable for discharge with appropriate outpatient follow up. Diagnosis was discussed with patient who verbalizes understanding of care plan and is agreeable to discharge. I have discussed return precautions with patient and husband who verbalizes understanding of return precautions. Patient encouraged to follow-up with their PCP. All questions answered.  Patient has been discharged in good condition.  Note: Portions of this report may have been transcribed using voice recognition software. Every effort was made to ensure accuracy; however, inadvertent computerized transcription errors may still be present. Final Clinical Impressions(s) / ED Diagnoses   Final diagnoses:  Cellulitis of right lower extremity  Bilateral lower extremity edema  Blister of right lower leg, initial encounter    ED Discharge Orders         Ordered    doxycycline (VIBRAMYCIN) 100 MG capsule  2 times daily     03/07/19 1811           Elizabeth PalauMorelli, Lake[place]ia Rasor A, PA-C 03/07/19 1814    Gwyneth SproutPlunkett, [place], MD 03/08/19 2043

## 2019-03-10 LAB — AEROBIC CULTURE W GRAM STAIN (SUPERFICIAL SPECIMEN)
Culture: NO GROWTH
Gram Stain: NONE SEEN

## 2019-06-06 ENCOUNTER — Inpatient Hospital Stay
Admission: AD | Admit: 2019-06-06 | Payer: Medicaid Other | Source: Other Acute Inpatient Hospital | Admitting: Cardiology

## 2019-06-06 DIAGNOSIS — E876 Hypokalemia: Secondary | ICD-10-CM

## 2019-06-06 DIAGNOSIS — N179 Acute kidney failure, unspecified: Secondary | ICD-10-CM

## 2019-06-06 DIAGNOSIS — J449 Chronic obstructive pulmonary disease, unspecified: Secondary | ICD-10-CM

## 2019-06-06 DIAGNOSIS — R7989 Other specified abnormal findings of blood chemistry: Secondary | ICD-10-CM

## 2019-06-06 DIAGNOSIS — I959 Hypotension, unspecified: Secondary | ICD-10-CM

## 2019-06-06 DIAGNOSIS — R55 Syncope and collapse: Secondary | ICD-10-CM

## 2019-06-06 DIAGNOSIS — I5032 Chronic diastolic (congestive) heart failure: Secondary | ICD-10-CM

## 2019-06-07 DIAGNOSIS — I959 Hypotension, unspecified: Secondary | ICD-10-CM | POA: Diagnosis not present

## 2019-06-07 DIAGNOSIS — R55 Syncope and collapse: Secondary | ICD-10-CM | POA: Diagnosis not present

## 2019-06-07 DIAGNOSIS — E876 Hypokalemia: Secondary | ICD-10-CM | POA: Diagnosis not present

## 2019-06-07 DIAGNOSIS — N179 Acute kidney failure, unspecified: Secondary | ICD-10-CM | POA: Diagnosis not present

## 2019-06-07 DIAGNOSIS — I517 Cardiomegaly: Secondary | ICD-10-CM

## 2019-06-08 DIAGNOSIS — R55 Syncope and collapse: Secondary | ICD-10-CM | POA: Diagnosis not present

## 2019-06-08 DIAGNOSIS — I959 Hypotension, unspecified: Secondary | ICD-10-CM | POA: Diagnosis not present

## 2019-06-08 DIAGNOSIS — E876 Hypokalemia: Secondary | ICD-10-CM | POA: Diagnosis not present

## 2019-06-08 DIAGNOSIS — N179 Acute kidney failure, unspecified: Secondary | ICD-10-CM | POA: Diagnosis not present

## 2019-06-09 DIAGNOSIS — I10 Essential (primary) hypertension: Secondary | ICD-10-CM

## 2019-06-09 DIAGNOSIS — R55 Syncope and collapse: Secondary | ICD-10-CM | POA: Diagnosis not present

## 2019-06-09 DIAGNOSIS — N179 Acute kidney failure, unspecified: Secondary | ICD-10-CM | POA: Diagnosis not present

## 2019-06-09 DIAGNOSIS — R079 Chest pain, unspecified: Secondary | ICD-10-CM | POA: Diagnosis not present

## 2019-06-09 DIAGNOSIS — I959 Hypotension, unspecified: Secondary | ICD-10-CM | POA: Diagnosis not present

## 2019-06-09 DIAGNOSIS — E876 Hypokalemia: Secondary | ICD-10-CM | POA: Diagnosis not present

## 2019-06-09 DIAGNOSIS — I2510 Atherosclerotic heart disease of native coronary artery without angina pectoris: Secondary | ICD-10-CM

## 2019-06-09 DIAGNOSIS — Q248 Other specified congenital malformations of heart: Secondary | ICD-10-CM

## 2019-06-10 DIAGNOSIS — I10 Essential (primary) hypertension: Secondary | ICD-10-CM | POA: Diagnosis not present

## 2019-06-10 DIAGNOSIS — Q248 Other specified congenital malformations of heart: Secondary | ICD-10-CM | POA: Diagnosis not present

## 2019-06-10 DIAGNOSIS — R55 Syncope and collapse: Secondary | ICD-10-CM | POA: Diagnosis not present

## 2019-06-10 DIAGNOSIS — E876 Hypokalemia: Secondary | ICD-10-CM | POA: Diagnosis not present

## 2019-06-10 DIAGNOSIS — R079 Chest pain, unspecified: Secondary | ICD-10-CM | POA: Diagnosis not present

## 2019-06-10 DIAGNOSIS — I2510 Atherosclerotic heart disease of native coronary artery without angina pectoris: Secondary | ICD-10-CM | POA: Diagnosis not present

## 2019-06-10 DIAGNOSIS — N179 Acute kidney failure, unspecified: Secondary | ICD-10-CM | POA: Diagnosis not present

## 2019-06-10 DIAGNOSIS — I959 Hypotension, unspecified: Secondary | ICD-10-CM | POA: Diagnosis not present

## 2019-06-24 ENCOUNTER — Ambulatory Visit: Payer: Medicaid Other | Admitting: Cardiology

## 2019-06-24 DIAGNOSIS — R55 Syncope and collapse: Secondary | ICD-10-CM | POA: Insufficient documentation

## 2019-06-24 DIAGNOSIS — Q248 Other specified congenital malformations of heart: Secondary | ICD-10-CM | POA: Insufficient documentation

## 2019-06-24 DIAGNOSIS — I2510 Atherosclerotic heart disease of native coronary artery without angina pectoris: Secondary | ICD-10-CM

## 2019-06-24 DIAGNOSIS — I10 Essential (primary) hypertension: Secondary | ICD-10-CM

## 2019-06-24 HISTORY — DX: Syncope and collapse: R55

## 2019-06-24 HISTORY — DX: Other specified congenital malformations of heart: Q24.8

## 2019-06-24 HISTORY — DX: Essential (primary) hypertension: I10

## 2019-06-24 HISTORY — DX: Atherosclerotic heart disease of native coronary artery without angina pectoris: I25.10

## 2019-06-24 NOTE — Progress Notes (Deleted)
Cardiology Office Note:    Date:  06/24/2019   ID:  Bethany Stuart, DOB [birth year], MRN [id number]  PCP:  System, Pcp Not In  Cardiologist:  No primary care provider on file.  Electrophysiologist:  None   Referring MD: No ref. provider found   Follow up visit.  History of Present Illness:    Bethany Stuart is a 53 y.o. female with a hx of moderate nonobstructive CAD with recent left heart cath in 2019, hypertension, diastolic heart failure, GERD presents today to establish cardiac care.  The patient was recently hospitalized at [facility] due to septic shock and syncope episode at which time an echocardiogram showed evidence of LVOT obstruction which was also noted in an echocardiogram performed in October 2015.  Between October 2015 and her most recent echocardiogram she did undergo an echo in December 2019 at [facility] which did not show any evidence of LVOT obstruction.  During her stay in the hospital recommended that the patient follow-up as an outpatient to repeat her echocardiogram as LVOT obstruction in the dynamic state and states of decreased preload could increase her pressure gradient.   Today she offers  Past Medical History:  Diagnosis Date  . Bulging lumbar disc   . COPD (chronic obstructive pulmonary disease) (HCC)   . Depression   . Hypertension   . Sciatica     Past Surgical History:  Procedure Laterality Date  . ABDOMINAL HYSTERECTOMY    . APPENDECTOMY    . CHOLECYSTECTOMY    . FOOT SURGERY    . INTRAVASCULAR PRESSURE WIRE/FFR STUDY N/A 06/15/2018   Procedure: INTRAVASCULAR PRESSURE WIRE/FFR STUDY;  Surgeon: Elder Negus, MD;  Location: [facility] INVASIVE CV LAB;  Service: Cardiovascular;  Laterality: N/A;  . LEFT HEART CATH AND CORONARY ANGIOGRAPHY N/A 06/15/2018   Procedure: LEFT HEART CATH AND CORONARY ANGIOGRAPHY;  Surgeon: Elder Negus, MD;  Location: [facility] INVASIVE CV LAB;  Service: Cardiovascular;  Laterality: N/A;    Current  Medications: No outpatient medications have been marked as taking for the 06/24/19 encounter (Appointment) with Thomasene Ripple, DO.     Allergies:   Fioricet [butalbital-apap-caffeine], Klonopin [clonazepam], and Penicillins   Social History   Socioeconomic History  . Marital status: Single    Spouse name: Not on file  . Number of children: Not on file  . Years of education: Not on file  . Highest education level: Not on file  Occupational History  . Not on file  Tobacco Use  . Smoking status: Current Every Day Smoker  . Smokeless tobacco: Never Used  Substance and Sexual Activity  . Alcohol use: Yes  . Drug use: No  . Sexual activity: Not on file  Other Topics Concern  . Not on file  Social History Narrative  . Not on file   Social Determinants of Health   Financial Resource Strain:   . Difficulty of Paying Living Expenses: Not on file  Food Insecurity:   . Worried About Programme researcher, broadcasting/film/video in the Last Year: Not on file  . Ran Out of Food in the Last Year: Not on file  Transportation Needs:   . Lack of Transportation (Medical): Not on file  . Lack of Transportation (Non-Medical): Not on file  Physical Activity:   . Days of Exercise per Week: Not on file  . Minutes of Exercise per Session: Not on file  Stress:   . Feeling of Stress : Not on file  Social Connections:   .  Frequency of Communication with Friends and Family: Not on file  . Frequency of Social Gatherings with Friends and Family: Not on file  . Attends Religious Services: Not on file  . Active Member of Clubs or Organizations: Not on file  . Attends BankerClub or Organization Meetings: Not on file  . Marital Status: Not on file     Family History: The patient's family history is not on file.  ROS:   Review of Systems  Constitution: Negative for decreased appetite, fever and weight gain.  HENT: Negative for congestion, ear discharge, hoarse voice and sore throat.   Eyes: Negative for discharge, redness,  vision loss in right eye and visual halos.  Cardiovascular: Negative for chest pain, dyspnea on exertion, leg swelling, orthopnea and palpitations.  Respiratory: Negative for cough, hemoptysis, shortness of breath and snoring.   Endocrine: Negative for heat intolerance and polyphagia.  Hematologic/Lymphatic: Negative for bleeding problem. Does not bruise/bleed easily.  Skin: Negative for flushing, nail changes, rash and suspicious lesions.  Musculoskeletal: Negative for arthritis, joint pain, muscle cramps, myalgias, neck pain and stiffness.  Gastrointestinal: Negative for abdominal pain, bowel incontinence, diarrhea and excessive appetite.  Genitourinary: Negative for decreased libido, genital sores and incomplete emptying.  Neurological: Negative for brief paralysis, focal weakness, headaches and loss of balance.  Psychiatric/Behavioral: Negative for altered mental status, depression and suicidal ideas.  Allergic/Immunologic: Negative for HIV exposure and persistent infections.    EKGs/Labs/Other Studies Reviewed:    The following studies were reviewed today:   EKG:  The ekg ordered today demonstrates   Transthoracic echocardiogram performed November 2020 Normal left ventricular hypertrophy.  LVEF 60 to 65%, normal right ventricle.  Normal left atrium.  Normal right atrium.  Aortic valve was not visualized.  Mean/peak gradient and velocity were concerning for severe aortic stenosis versus subvalvular obstruction.  There is no evidence of aortic regurgitation.  There is trace to mild mitral vegetation.  Tricuspid vegetation present.  TTE Study Conclusions December 2019 - Left ventricle: The cavity size was normal. Wall thickness was   normal. Systolic function was normal. The estimated ejection   fraction was in the range of 55% to 65%. Wall motion was normal;   there were no regional wall motion abnormalities. Doppler   parameters are consistent with abnormal left ventricular    relaxation (grade 1 diastolic dysfunction). - No significant valvular abnormality. - No evidence of pulmonary hypertension.  Recent Labs: No results found for requested labs within last 8760 hours.  Recent Lipid Panel    Component Value Date/Time   CHOL 165 06/14/2018 2326   TRIG 191 (H) 06/14/2018 2326   HDL 64 06/14/2018 2326   CHOLHDL 2.6 06/14/2018 2326   VLDL 38 06/14/2018 2326   LDLCALC 63 06/14/2018 2326    Physical Exam:    VS:  There were no vitals taken for this visit.    Wt Readings from Last 3 Encounters:  03/07/19 192 lb (87.1 kg)  06/18/18 190 lb 3.2 oz (86.3 kg)  06/03/18 175 lb (79.4 kg)     GEN: Well nourished, well developed in no acute distress HEENT: Normal NECK: No JVD; No carotid bruits LYMPHATICS: No lymphadenopathy CARDIAC: S1S2 noted,RRR, no murmurs, rubs, gallops RESPIRATORY:  Clear to auscultation without rales, wheezing or rhonchi  ABDOMEN: Soft, non-tender, non-distended, +bowel sounds, no guarding. EXTREMITIES: No edema, No cyanosis, no clubbing MUSCULOSKELETAL:  No edema; No deformity  SKIN: Warm and dry NEUROLOGIC:  Alert and oriented x 3, non-focal PSYCHIATRIC:  Normal  affect, good insight  ASSESSMENT:    1. Coronary artery disease involving native coronary artery of native heart without angina pectoris   2. Syncope, unspecified syncope type   3. Left ventricular outflow tract obstruction   4. Essential hypertension    PLAN:    In order of problems listed above:  1.  The patient is in agreement with the above plan. The patient left the office in stable condition.  The patient will follow up in   Medication Adjustments/Labs and Tests Ordered: Current medicines are reviewed at length with the patient today.  Concerns regarding medicines are outlined above.  No orders of the defined types were placed in this encounter.  No orders of the defined types were placed in this encounter.   There are no Patient Instructions on file  for this visit.   Adopting a Healthy Lifestyle.  Know what a healthy weight is for you (roughly BMI <25) and aim to maintain this   Aim for 7+ servings of fruits and vegetables daily   65-80+ fluid ounces of water or unsweet tea for healthy kidneys   Limit to max 1 drink of alcohol per day; avoid smoking/tobacco   Limit animal fats in diet for cholesterol and heart health - choose grass fed whenever available   Avoid highly processed foods, and foods high in saturated/trans fats   Aim for low stress - take time to unwind and care for your mental health   Aim for 150 min of moderate intensity exercise weekly for heart health, and weights twice weekly for bone health   Aim for 7-9 hours of sleep daily   When it comes to diets, agreement about the perfect plan isnt easy to find, even among the experts. Experts at the [employer] developed an idea known as the Healthy Eating Plate. Just imagine a plate divided into logical, healthy portions.   The emphasis is on diet quality:   Load up on vegetables and fruits - one-half of your plate: Aim for color and variety, and remember that potatoes dont count.   Go for whole grains - one-quarter of your plate: Whole wheat, barley, wheat berries, quinoa, oats, brown rice, and foods made with them. If you want pasta, go with whole wheat pasta.   Protein power - one-quarter of your plate: Fish, chicken, beans, and nuts are all healthy, versatile protein sources. Limit red meat.   The diet, however, does go beyond the plate, offering a few other suggestions.   Use healthy plant oils, such as olive, canola, soy, corn, sunflower and peanut. Check the labels, and avoid partially hydrogenated oil, which have unhealthy trans fats.   If youre thirsty, drink water. Coffee and tea are good in moderation, but skip sugary drinks and limit milk and dairy products to one or two daily servings.   The type of carbohydrate in the diet is  more important than the amount. Some sources of carbohydrates, such as vegetables, fruits, whole grains, and beans-are healthier than others.   Finally, stay active  Signed, Berniece Salines, DO  06/24/2019 1:03 PM    [facility]

## 2019-06-25 ENCOUNTER — Ambulatory Visit: Payer: Medicaid Other

## 2019-06-25 ENCOUNTER — Encounter: Payer: Self-pay | Admitting: Cardiology

## 2019-06-25 ENCOUNTER — Ambulatory Visit (INDEPENDENT_AMBULATORY_CARE_PROVIDER_SITE_OTHER): Payer: Medicaid Other | Admitting: Cardiology

## 2019-06-25 ENCOUNTER — Other Ambulatory Visit: Payer: Self-pay

## 2019-06-25 VITALS — BP 190/102 | HR 87 | Ht 67.0 in | Wt 196.2 lb

## 2019-06-25 DIAGNOSIS — I25118 Atherosclerotic heart disease of native coronary artery with other forms of angina pectoris: Secondary | ICD-10-CM | POA: Diagnosis not present

## 2019-06-25 DIAGNOSIS — I10 Essential (primary) hypertension: Secondary | ICD-10-CM

## 2019-06-25 DIAGNOSIS — E782 Mixed hyperlipidemia: Secondary | ICD-10-CM

## 2019-06-25 DIAGNOSIS — R55 Syncope and collapse: Secondary | ICD-10-CM

## 2019-06-25 DIAGNOSIS — Q248 Other specified congenital malformations of heart: Secondary | ICD-10-CM

## 2019-06-25 MED ORDER — CARVEDILOL 25 MG PO TABS: 25.0000 mg | ORAL_TABLET | Freq: Two times a day (BID) | ORAL | 1 refills | Status: DC

## 2019-06-25 NOTE — Progress Notes (Signed)
Cardiology Office Note:    Date:  06/25/2019   ID:  Bethany Stuart, DOB 12/17/1965, MRN [id number]  PCP:  Amin, Saad, MD  Cardiologist:  Devyne Hauger, DO  Electrophysiologist:  None   Referring MD: Amin, Saad, MD   To establish cardiac care  History of Present Illness:    Bethany Stuart is a 53 y.o. female with a hx of moderate nonobstructive coronary artery disease with her most recent left heart cath in December 2019 which showed a 40% proximal and 60% mid RCA lesion with a 30% LAD lesion, hypertension, diastolic heart failure, GERD, history of LVOT obstruction with a gradient of 146 mmHg on echo performed in November 2018.  Subsequently in December 2019 she had an echocardiogram performed with no noted LVOT obstruction.  She was recently admitted at the [facility] after she had a syncopal event.  During admission she was noted to have a urinary tract infection concern for septic shock treated with IV antibiotics.  While in the hospital she complained of chest pain and did have changes in her EKG suggesting ischemia in the inferior lateral wall.  She was treated medically.    During her hospitalization the patient also had an echocardiogram which was a technically difficult study however was also concerning for left LVOT obstruction with a gradient of 196 mmHg.  Due to LV obstruction creatinine being so dynamic independent on fluid state/preload it was recommended the patient be reevaluated after her infection at which time she was also dehydrated.  Today she is here to establish cardiac care.  She tells me that she has been ill over the last several days.  But most markedly she has been complaining of worsening chest pain.  She notes that it still intermittent but the frequency is now more.  Describes it as a midsternal dullness that radiates sometimes to the upper chest.  She does have intermittent shortness of breath.  She denies any lightheadedness or repeat syncope  episodes.    Past Medical History:  Diagnosis Date  . Arthritis    Back  . Bulging lumbar disc   . COPD (chronic obstructive pulmonary disease) (HCC)   . Depression   . Hypertension   . Sciatica     Past Surgical History:  Procedure Laterality Date  . ABDOMINAL HYSTERECTOMY    . APPENDECTOMY    . CHOLECYSTECTOMY    . FOOT SURGERY    . INTRAVASCULAR PRESSURE WIRE/FFR STUDY N/A 06/15/2018   Procedure: INTRAVASCULAR PRESSURE WIRE/FFR STUDY;  Surgeon: Patwardhan, Manish J, MD;  Location: [facility];  Service: Cardiovascular;  Laterality: N/A;  . KNEE SURGERY Right   . LEFT HEART CATH AND CORONARY ANGIOGRAPHY N/A 06/15/2018   Procedure: LEFT HEART CATH AND CORONARY ANGIOGRAPHY;  Surgeon: Patwardhan, Manish J, MD;  Location: [facility];  Service: Cardiovascular;  Laterality: N/A;    Current Medications: Current Meds  Medication Sig  . ALPRAZolam (XANAX) 0.5 MG tablet Take 0.5 mg by mouth daily as needed for anxiety.  . aspirin EC 81 MG EC tablet Take 1 tablet (81 mg total) by mouth daily.  . atorvastatin (LIPITOR) 40 MG tablet Take 40 mg by mouth daily.  . clopidogrel (PLAVIX) 75 MG tablet Take 1 tablet (75 mg total) by mouth daily. Continue aspirin and Plavix together for 1 year  . FLUoxetine (PROZAC) 20 MG tablet Take 20 mg by mouth daily.  . hydrochlorothiazide (HYDRODIURIL) 25 MG tablet Take 25 mg by mouth daily.  .   traZODone (DESYREL) 100 MG tablet Take 100 mg by mouth at bedtime.  . [DISCONTINUED] amLODipine (NORVASC) 10 MG tablet Take 1 tablet (10 mg total) by mouth daily.  . [DISCONTINUED] carvedilol (COREG) 6.25 MG tablet Take 6.25 mg by mouth 2 (two) times daily with a meal.     Allergies:   Fioricet [butalbital-apap-caffeine], Klonopin [clonazepam], and Penicillins   Social History   Socioeconomic History  . Marital status: Single    Spouse name: Not on file  . Number of children: Not on file  . Years of education: Not on file  . Highest education  level: Not on file  Occupational History  . Not on file  Tobacco Use  . Smoking status: Current Every Day Smoker  . Smokeless tobacco: Never Used  Substance and Sexual Activity  . Alcohol use: Yes  . Drug use: No  . Sexual activity: Not on file  Other Topics Concern  . Not on file  Social History Narrative  . Not on file   Social Determinants of Health   Financial Resource Strain:   . Difficulty of Paying Living Expenses: Not on file  Food Insecurity:   . Worried About Running Out of Food in the Last Year: Not on file  . Ran Out of Food in the Last Year: Not on file  Transportation Needs:   . Lack of Transportation (Medical): Not on file  . Lack of Transportation (Non-Medical): Not on file  Physical Activity:   . Days of Exercise per Week: Not on file  . Minutes of Exercise per Session: Not on file  Stress:   . Feeling of Stress : Not on file  Social Connections:   . Frequency of Communication with Friends and Family: Not on file  . Frequency of Social Gatherings with Friends and Family: Not on file  . Attends Religious Services: Not on file  . Active Member of Clubs or Organizations: Not on file  . Attends Club or Organization Meetings: Not on file  . Marital Status: Not on file     Family History: The patient's family history includes CAD in her mother; Diabetes in her maternal grandmother; Emphysema in her father; Fibromyalgia in her sister and sister; Heart attack in her brother and maternal grandfather; Hypertension in her mother; Pneumonia in her mother.  ROS:   Review of Systems  Constitution: Negative for decreased appetite, fever and weight gain.  Recent syncope episode HENT: Negative for congestion, ear discharge, hoarse voice and sore throat.   Eyes: Negative for discharge, redness, vision loss in right eye and visual halos.  Cardiovascular: Reports for chest pain, dyspnea on exertion.  Negative for, leg swelling, orthopnea and palpitations.  Respiratory:  Negative for cough, hemoptysis, shortness of breath and snoring.   Endocrine: Negative for heat intolerance and polyphagia.  Hematologic/Lymphatic: Negative for bleeding problem. Does not bruise/bleed easily.  Skin: Negative for flushing, nail changes, rash and suspicious lesions.  Musculoskeletal: Negative for arthritis, joint pain, muscle cramps, myalgias, neck pain and stiffness.  Gastrointestinal: Negative for abdominal pain, bowel incontinence, diarrhea and excessive appetite.  Genitourinary: Negative for decreased libido, genital sores and incomplete emptying.  Neurological: Negative for brief paralysis, focal weakness, headaches and loss of balance.  Psychiatric/Behavioral: Negative for altered mental status, depression and suicidal ideas.  Allergic/Immunologic: Negative for HIV exposure and persistent infections.    EKGs/Labs/Other Studies Reviewed:    The following studies were reviewed today:   EKG:  The ekg ordered today demonstrates sinus rhythm, heart rate   87 bpm, poor R wave progression which is consistent with anteroseptal infarction, and 0.5 SD depression in leads II and III.  Transthoracic echocardiogram done in November 2020: Mild concentric left ventricular hypertrophy.  Left ventricle ejection fraction 60 to 65%.  The aortic valve was not well visualized however the severe aortic stenosis presence of valvular structure cannot be ruled out.  Mean gradient across the the outflow track 196 mmHg.  Trace to mild mitral regurgitation.  Trace tricuspid regurgitation.  Transthoracic echocardiogram December 2019 Conclusions - Left ventricle: The cavity size was normal. Wall thickness was   normal. Systolic function was normal. The estimated ejection   fraction was in the range of 55% to 65%. Wall motion was normal;   there were no regional wall motion abnormalities. Doppler   parameters are consistent with abnormal left ventricular   relaxation (grade 1 diastolic  dysfunction). - No significant valvular abnormality. - No evidence of pulmonary hypertension.  Left heart catheterization December 2019: LM: Normal LAD: Mid 30% stenosis LCx: Normal RCA: Prox 40%, mid 60% stenoses. dFR 0.93 (Physiologically nonsignificant) Nonobstructive coronary artery disease   Recent Labs: No results found for requested labs within last 8760 hours.  Recent Lipid Panel    Component Value Date/Time   CHOL 165 06/14/2018 2326   TRIG 191 (H) 06/14/2018 2326   HDL 64 06/14/2018 2326   CHOLHDL 2.6 06/14/2018 2326   VLDL 38 06/14/2018 2326   LDLCALC 63 06/14/2018 2326    Physical Exam:    VS:  BP (!) 190/102 (BP Location: Right Arm)   Pulse 87   Ht 5' 7" (1.702 m)   Wt 196 lb 3.2 oz (89 kg)   BMI 30.73 kg/m     Wt Readings from Last 3 Encounters:  06/25/19 196 lb 3.2 oz (89 kg)  03/07/19 192 lb (87.1 kg)  06/18/18 190 lb 3.2 oz (86.3 kg)     GEN: Well nourished, well developed in no acute distress HEENT: Normal NECK: No JVD; No carotid bruits LYMPHATICS: No lymphadenopathy CARDIAC: S1S2 noted,RRR, no murmurs, rubs, gallops RESPIRATORY:  Clear to auscultation without rales, wheezing or rhonchi  ABDOMEN: Soft, non-tender, non-distended, +bowel sounds, no guarding. EXTREMITIES: No edema, No cyanosis, no clubbing MUSCULOSKELETAL:  No edema; No deformity  SKIN: Warm and dry NEUROLOGIC:  Alert and oriented x 3, non-focal PSYCHIATRIC:  Normal affect, good insight  ASSESSMENT:    1. Coronary artery disease of native artery of native heart with stable angina pectoris (HCC)   2. Syncope, unspecified syncope type   3. Essential hypertension   4. Mixed hyperlipidemia   5. Left ventricular outflow tract obstruction    PLAN:    1.  She is hypertensive as well today in the office, I have increased her carvedilol to 25 mg twice a day.  She was placed on amlodipine 10 mg and due to concerns for increased left ventricular flow tract gradient I like to take  her off this medication for now.  And also I would prefer to avoid vasodilators which would include ACE inhibitor's and hydralazine.  This may be a bit difficult in terms of her blood pressure control but I am hoping that the increase of her beta-blocker will be sufficient.  She is on hydrochlorothiazide going to continue this for now for cautious diuretics have also asked patient doing continue to increase fluid intake to avoid dehydration.  I am repeating her echocardiogram for hopefully better quality study to understand the elevated gradients.  Especially now   that dehydration and infection which could have decrease her preload at the time of hospital admission is no longer a factor here.  2. Her chest pain now is become concerning as this is now recurring frequently.  Given that the patient does have known moderate coronary disease I am concerned of progression coronary artery disease. We will therefore proceed with a left heart catheterization in this patient.  The patient understands that risks include but are not limited to stroke (1 in 1000), death (1 in 1000), kidney failure [usually temporary] (1 in 500), bleeding (1 in 200), allergic reaction [possibly serious] (1 in 200), and agrees to proceed.  With the concern of LVOT obstruction, hopefully the beta-blocker will help with the anginal symptoms has been cautious to start the patient on nitrates until this is understood.  3.  ZIO monitor has been placed on the patient for 14 days due to recent syncope episodes to assess for any arrhythmogenic origin of this.  The patient is in agreement with the above plan. The patient left the office in stable condition.  The patient will follow up in 2 weeks for blood pressure check.  Medication Adjustments/Labs and Tests Ordered: Current medicines are reviewed at length with the patient today.  Concerns regarding medicines are outlined above.  Orders Placed This Encounter  Procedures  . CBC  . Basic  Metabolic Panel (BMET)  . LONG TERM MONITOR (3-14 DAYS)  . EKG 12-Lead  . ECHOCARDIOGRAM COMPLETE   Meds ordered this encounter  Medications  . carvedilol (COREG) 25 MG tablet    Sig: Take 1 tablet (25 mg total) by mouth 2 (two) times daily.    Dispense:  180 tablet    Refill:  1    Patient Instructions  Medication Instructions:  Your physician has recommended you make the following change in your medication:   STOP: Amlodipine  INCREASE : Coreg (carvedilol) to 25 mg Take 1 tab twice daily   *If you need a refill on your cardiac medications before your next appointment, please call your pharmacy*  Lab Work: Your physician recommends that you return for lab work in: TODAY BMP,CBC  If you have labs (blood work) drawn today and your tests are completely normal, you will receive your results only by: . MyChart Message (if you have MyChart) OR . A paper copy in the mail If you have any lab test that is abnormal or we need to change your treatment, we will call you to review the results.  Testing/Procedures: Your physician has requested that you have an echocardiogram. Echocardiography is a painless test that uses sound waves to create images of your heart. It provides your doctor with information about the size and shape of your heart and how well your heart's chambers and valves are working. This procedure takes approximately one hour. There are no restrictions for this procedure.  A zio monitor was placed today. It will remain on for 14 days. You will then return monitor and event diary in provided box. It takes 1-2 weeks for report to be downloaded and returned to us. We will call you with the results. If monitor falls off or has orange flashing light, please call Zio for further instructions.      [facility] [address] [place] [address] Dept: [phone] Loc: [phone]  Orlean  Maloney  06/25/2019  You are scheduled for a Cardiac Catheterization on Tuesday, December 22 with Dr. Henry   Smith.  1. Please arrive at the [facility] (Main Entrance A) at [facility]: [address] [place], [place] [address] at 7:00 AM (This time is two hours before your procedure to ensure your preparation). Free valet parking service is available.   Special note: Every effort is made to have your procedure done on time. Please understand that emergencies sometimes delay scheduled procedures.  2. Diet: Do not eat solid foods after midnight.  The patient may have clear liquids until 5am upon the day of the procedure.  3. Labs: You will need to have a COVID screen prior to cath. Your appointment is Saturday Dec 19,2020 at 11:10 AM at [address] ,[place]  4. Medication instructions in preparation for your procedure:   Contrast Allergy: No  On the morning of your procedure, take your Aspirin and any morning medicines NOT listed above.  You may use sips of water.  5. Plan for one night stay--bring personal belongings. 6. Bring a current list of your medications and current insurance cards. 7. You MUST have a responsible person to drive you home. 8. Someone MUST be with you the first 24 hours after you arrive home or your discharge will be delayed. 9. Please wear clothes that are easy to get on and off and wear slip-on shoes.  Thank you for allowing us to care for you!   -- [facility] Follow-Up: At [facility], you and your health needs are our priority.  As part of our continuing mission to provide you with exceptional heart care, we have created designated Provider Care Teams.  These Care Teams include your primary Cardiologist (physician) and Advanced Practice Providers (APPs -  Physician Assistants and Nurse Practitioners) who all work together to provide you with the care you need, when you need it.  Your next appointment:    2 week(s)  The format for your next appointment:   In Person  Provider:   Dominic Mahaney, DO  Other Instructions      Adopting a Healthy Lifestyle.  Know what a healthy weight is for you (roughly BMI <25) and aim to maintain this   Aim for 7+ servings of fruits and vegetables daily   65-80+ fluid ounces of water or unsweet tea for healthy kidneys   Limit to max 1 drink of alcohol per day; avoid smoking/tobacco   Limit animal fats in diet for cholesterol and heart health - choose grass fed whenever available   Avoid highly processed foods, and foods high in saturated/trans fats   Aim for low stress - take time to unwind and care for your mental health   Aim for 150 min of moderate intensity exercise weekly for heart health, and weights twice weekly for bone health   Aim for 7-9 hours of sleep daily   When it comes to diets, agreement about the perfect plan isnt easy to find, even among the experts. Experts at the [employer] developed an idea known as the Healthy Eating Plate. Just imagine a plate divided into logical, healthy portions.   The emphasis is on diet quality:   Load up on vegetables and fruits - one-half of your plate: Aim for color and variety, and remember that potatoes dont count.   Go for whole grains - one-quarter of your plate: Whole wheat, barley, wheat berries, quinoa, oats, brown rice, and foods made with them. If you want pasta, go with whole wheat pasta.   Protein   power - one-quarter of your plate: Fish, chicken, beans, and nuts are all healthy, versatile protein sources. Limit red meat.   The diet, however, does go beyond the plate, offering a few other suggestions.   Use healthy plant oils, such as olive, canola, soy, corn, sunflower and peanut. Check the labels, and avoid partially hydrogenated oil, which have unhealthy trans fats.   If youre thirsty, drink water. Coffee and tea are good in moderation, but skip sugary  drinks and limit milk and dairy products to one or two daily servings.   The type of carbohydrate in the diet is more important than the amount. Some sources of carbohydrates, such as vegetables, fruits, whole grains, and beans-are healthier than others.   Finally, stay active  Signed, Lovena Kluck, DO  06/25/2019 12:41 PM     [facility] 

## 2019-06-25 NOTE — Progress Notes (Signed)
ekg 

## 2019-06-25 NOTE — H&P (View-Only) (Signed)
Cardiology Office Note:    Date:  06/25/2019   ID:  Bethany Stuart, DOB [birth year], MRN [id number]  PCP:  Garwin Brothers, MD  Cardiologist:  Berniece Salines, DO  Electrophysiologist:  None   Referring MD: Garwin Brothers, MD   To establish cardiac care  History of Present Illness:    Bethany Stuart is a 53 y.o. female with a hx of moderate nonobstructive coronary artery disease with her most recent left heart cath in December 2019 which showed a 40% proximal and 60% mid RCA lesion with a 30% LAD lesion, hypertension, diastolic heart failure, GERD, history of LVOT obstruction with a gradient of 146 mmHg on echo performed in November 2018.  Subsequently in December 2019 she had an echocardiogram performed with no noted LVOT obstruction.  She was recently admitted at the [facility] after she had a syncopal event.  During admission she was noted to have a urinary tract infection concern for septic shock treated with IV antibiotics.  While in the hospital she complained of chest pain and did have changes in her EKG suggesting ischemia in the inferior lateral wall.  She was treated medically.    During her hospitalization the patient also had an echocardiogram which was a technically difficult study however was also concerning for left LVOT obstruction with a gradient of 196 mmHg.  Due to LV obstruction creatinine being so dynamic independent on fluid state/preload it was recommended the patient be reevaluated after her infection at which time she was also dehydrated.  Today she is here to establish cardiac care.  She tells me that she has been ill over the last several days.  But most markedly she has been complaining of worsening chest pain.  She notes that it still intermittent but the frequency is now more.  Describes it as a midsternal dullness that radiates sometimes to the upper chest.  She does have intermittent shortness of breath.  She denies any lightheadedness or repeat syncope  episodes.    Past Medical History:  Diagnosis Date  . Arthritis    Back  . Bulging lumbar disc   . COPD (chronic obstructive pulmonary disease) ([place])   . Depression   . Hypertension   . Sciatica     Past Surgical History:  Procedure Laterality Date  . ABDOMINAL HYSTERECTOMY    . APPENDECTOMY    . CHOLECYSTECTOMY    . FOOT SURGERY    . INTRAVASCULAR PRESSURE WIRE/FFR STUDY N/A 06/15/2018   Procedure: INTRAVASCULAR PRESSURE WIRE/FFR STUDY;  Surgeon: Nigel Mormon, MD;  Location: [place] [facility];  Service: Cardiovascular;  Laterality: N/A;  . KNEE SURGERY Right   . LEFT HEART CATH AND CORONARY ANGIOGRAPHY N/A 06/15/2018   Procedure: LEFT HEART CATH AND CORONARY ANGIOGRAPHY;  Surgeon: Nigel Mormon, MD;  Location: [place] [facility];  Service: Cardiovascular;  Laterality: N/A;    Current Medications: Current Meds  Medication Sig  . ALPRAZolam (XANAX) 0.5 MG tablet Take 0.5 mg by mouth daily as needed for anxiety.  Marland Kitchen aspirin EC 81 MG EC tablet Take 1 tablet (81 mg total) by mouth daily.  Marland Kitchen atorvastatin (LIPITOR) 40 MG tablet Take 40 mg by mouth daily.  . clopidogrel (PLAVIX) 75 MG tablet Take 1 tablet (75 mg total) by mouth daily. Continue aspirin and Plavix together for 1 year  . FLUoxetine (PROZAC) 20 MG tablet Take 20 mg by mouth daily.  . hydrochlorothiazide (HYDRODIURIL) 25 MG tablet Take 25 mg by mouth daily.  Marland Kitchen  traZODone (DESYREL) 100 MG tablet Take 100 mg by mouth at bedtime.  . [DISCONTINUED] amLODipine (NORVASC) 10 MG tablet Take 1 tablet (10 mg total) by mouth daily.  . [DISCONTINUED] carvedilol (COREG) 6.25 MG tablet Take 6.25 mg by mouth 2 (two) times daily with a meal.     Allergies:   Fioricet [butalbital-apap-caffeine], Klonopin [clonazepam], and Penicillins   Social History   Socioeconomic History  . Marital status: Single    Spouse name: Not on file  . Number of children: Not on file  . Years of education: Not on file  . Highest education  level: Not on file  Occupational History  . Not on file  Tobacco Use  . Smoking status: Current Every Day Smoker  . Smokeless tobacco: Never Used  Substance and Sexual Activity  . Alcohol use: Yes  . Drug use: No  . Sexual activity: Not on file  Other Topics Concern  . Not on file  Social History Narrative  . Not on file   Social Determinants of Health   Financial Resource Strain:   . Difficulty of Paying Living Expenses: Not on file  Food Insecurity:   . Worried About Programme researcher, broadcasting/film/video in the Last Year: Not on file  . Ran Out of Food in the Last Year: Not on file  Transportation Needs:   . Lack of Transportation (Medical): Not on file  . Lack of Transportation (Non-Medical): Not on file  Physical Activity:   . Days of Exercise per Week: Not on file  . Minutes of Exercise per Session: Not on file  Stress:   . Feeling of Stress : Not on file  Social Connections:   . Frequency of Communication with Friends and Family: Not on file  . Frequency of Social Gatherings with Friends and Family: Not on file  . Attends Religious Services: Not on file  . Active Member of Clubs or Organizations: Not on file  . Attends Banker Meetings: Not on file  . Marital Status: Not on file     Family History: The patient's family history includes CAD in her mother; Diabetes in her maternal grandmother; Emphysema in her father; Fibromyalgia in her sister and sister; Heart attack in her brother and maternal grandfather; Hypertension in her mother; Pneumonia in her mother.  ROS:   Review of Systems  Constitution: Negative for decreased appetite, fever and weight gain.  Recent syncope episode HENT: Negative for congestion, ear discharge, hoarse voice and sore throat.   Eyes: Negative for discharge, redness, vision loss in right eye and visual halos.  Cardiovascular: Reports for chest pain, dyspnea on exertion.  Negative for, leg swelling, orthopnea and palpitations.  Respiratory:  Negative for cough, hemoptysis, shortness of breath and snoring.   Endocrine: Negative for heat intolerance and polyphagia.  Hematologic/Lymphatic: Negative for bleeding problem. Does not bruise/bleed easily.  Skin: Negative for flushing, nail changes, rash and suspicious lesions.  Musculoskeletal: Negative for arthritis, joint pain, muscle cramps, myalgias, neck pain and stiffness.  Gastrointestinal: Negative for abdominal pain, bowel incontinence, diarrhea and excessive appetite.  Genitourinary: Negative for decreased libido, genital sores and incomplete emptying.  Neurological: Negative for brief paralysis, focal weakness, headaches and loss of balance.  Psychiatric/Behavioral: Negative for altered mental status, depression and suicidal ideas.  Allergic/Immunologic: Negative for HIV exposure and persistent infections.    EKGs/Labs/Other Studies Reviewed:    The following studies were reviewed today:   EKG:  The ekg ordered today demonstrates sinus rhythm, heart rate  87 bpm, poor R wave progression which is consistent with anteroseptal infarction, and 0.5 SD depression in leads II and III.  Transthoracic echocardiogram done in November 2020: Mild concentric left ventricular hypertrophy.  Left ventricle ejection fraction 60 to 65%.  The aortic valve was not well visualized however the severe aortic stenosis presence of valvular structure cannot be ruled out.  Mean gradient across the the outflow track 196 mmHg.  Trace to mild mitral regurgitation.  Trace tricuspid regurgitation.  Transthoracic echocardiogram December 2019 Conclusions - Left ventricle: The cavity size was normal. Wall thickness was   normal. Systolic function was normal. The estimated ejection   fraction was in the range of 55% to 65%. Wall motion was normal;   there were no regional wall motion abnormalities. Doppler   parameters are consistent with abnormal left ventricular   relaxation (grade 1 diastolic  dysfunction). - No significant valvular abnormality. - No evidence of pulmonary hypertension.  Left heart catheterization December 2019: LM: Normal LAD: Mid 30% stenosis LCx: Normal RCA: Prox 40%, mid 60% stenoses. dFR 0.93 (Physiologically nonsignificant) Nonobstructive coronary artery disease   Recent Labs: No results found for requested labs within last 8760 hours.  Recent Lipid Panel    Component Value Date/Time   CHOL 165 06/14/2018 2326   TRIG 191 (H) 06/14/2018 2326   HDL 64 06/14/2018 2326   CHOLHDL 2.6 06/14/2018 2326   VLDL 38 06/14/2018 2326   LDLCALC 63 06/14/2018 2326    Physical Exam:    VS:  BP (!) 190/102 (BP Location: Right Arm)   Pulse 87   Ht 5\' 7"  (1.702 m)   Wt 196 lb 3.2 oz (89 kg)   BMI 30.73 kg/m     Wt Readings from Last 3 Encounters:  06/25/19 196 lb 3.2 oz (89 kg)  03/07/19 192 lb (87.1 kg)  06/18/18 190 lb 3.2 oz (86.3 kg)     GEN: Well nourished, well developed in no acute distress HEENT: Normal NECK: No JVD; No carotid bruits LYMPHATICS: No lymphadenopathy CARDIAC: S1S2 noted,RRR, no murmurs, rubs, gallops RESPIRATORY:  Clear to auscultation without rales, wheezing or rhonchi  ABDOMEN: Soft, non-tender, non-distended, +bowel sounds, no guarding. EXTREMITIES: No edema, No cyanosis, no clubbing MUSCULOSKELETAL:  No edema; No deformity  SKIN: Warm and dry NEUROLOGIC:  Alert and oriented x 3, non-focal PSYCHIATRIC:  Normal affect, good insight  ASSESSMENT:    1. Coronary artery disease of native artery of native heart with stable angina pectoris (HCC)   2. Syncope, unspecified syncope type   3. Essential hypertension   4. Mixed hyperlipidemia   5. Left ventricular outflow tract obstruction    PLAN:    1.  She is hypertensive as well today in the office, I have increased her carvedilol to 25 mg twice a day.  She was placed on amlodipine 10 mg and due to concerns for increased left ventricular flow tract gradient I like to take  her off this medication for now.  And also I would prefer to avoid vasodilators which would include ACE inhibitor's and hydralazine.  This may be a bit difficult in terms of her blood pressure control but I am hoping that the increase of her beta-blocker will be sufficient.  She is on hydrochlorothiazide going to continue this for now for cautious diuretics have also asked patient doing continue to increase fluid intake to avoid dehydration.  I am repeating her echocardiogram for hopefully better quality study to understand the elevated gradients.  Especially now  that dehydration and infection which could have decrease her preload at the time of hospital admission is no longer a factor here.  2. Her chest pain now is become concerning as this is now recurring frequently.  Given that the patient does have known moderate coronary disease I am concerned of progression coronary artery disease. We will therefore proceed with a left heart catheterization in this patient.  The patient understands that risks include but are not limited to stroke (1 in 1000), death (1 in 1000), kidney failure [usually temporary] (1 in 500), bleeding (1 in 200), allergic reaction [possibly serious] (1 in 200), and agrees to proceed.  With the concern of LVOT obstruction, hopefully the beta-blocker will help with the anginal symptoms has been cautious to start the patient on nitrates until this is understood.  3.  ZIO monitor has been placed on the patient for 14 days due to recent syncope episodes to assess for any arrhythmogenic origin of this.  The patient is in agreement with the above plan. The patient left the office in stable condition.  The patient will follow up in 2 weeks for blood pressure check.  Medication Adjustments/Labs and Tests Ordered: Current medicines are reviewed at length with the patient today.  Concerns regarding medicines are outlined above.  Orders Placed This Encounter  Procedures  . CBC  . Basic  Metabolic Panel (BMET)  . LONG TERM MONITOR (3-14 DAYS)  . EKG 12-Lead  . ECHOCARDIOGRAM COMPLETE   Meds ordered this encounter  Medications  . carvedilol (COREG) 25 MG tablet    Sig: Take 1 tablet (25 mg total) by mouth 2 (two) times daily.    Dispense:  180 tablet    Refill:  1    Patient Instructions  Medication Instructions:  Your physician has recommended you make the following change in your medication:   STOP: Amlodipine  INCREASE : Coreg (carvedilol) to 25 mg Take 1 tab twice daily   *If you need a refill on your cardiac medications before your next appointment, please call your pharmacy*  Lab Work: Your physician recommends that you return for lab work in: TODAY BMP,CBC  If you have labs (blood work) drawn today and your tests are completely normal, you will receive your results only by: Marland Kitchen MyChart Message (if you have MyChart) OR . A paper copy in the mail If you have any lab test that is abnormal or we need to change your treatment, we will call you to review the results.  Testing/Procedures: Your physician has requested that you have an echocardiogram. Echocardiography is a painless test that uses sound waves to create images of your heart. It provides your doctor with information about the size and shape of your heart and how well your heart's chambers and valves are working. This procedure takes approximately one hour. There are no restrictions for this procedure.  A zio monitor was placed today. It will remain on for 14 days. You will then return monitor and event diary in provided box. It takes 1-2 weeks for report to be downloaded and returned to [place]. We will call you with the results. If monitor falls off or has orange flashing light, please call Zio for further instructions.      [facility] CARDIOVASCULAR DIVISION [facility]ART[facility] AT [place] [address] [place] [address] Dept: [phone] Loc: [phone]  Bethany Stuart  06/25/2019  You are scheduled for a Cardiac Catheterization on Tuesday, December 22 with Dr. Sherilyn Cooter  Smith.  1. Please arrive at the [facility] (Main Entrance A) at [facility]: [address] [place], [place] [address] at 7:00 AM (This time is two hours before your procedure to ensure your preparation). Free valet parking service is available.   Special note: Every effort is made to have your procedure done on time. Please understand that emergencies sometimes delay scheduled procedures.  2. Diet: Do not eat solid foods after midnight.  The patient may have clear liquids until 5am upon the day of the procedure.  3. Labs: You will need to have a COVID screen prior to cath. Your appointment is Saturday Dec 19,2020 at 11:10 AM at [address] [facility]  4. Medication instructions in preparation for your procedure:   Contrast Allergy: No  On the morning of your procedure, take your Aspirin and any morning medicines NOT listed above.  You may use sips of water.  5. Plan for one night stay--bring personal belongings. 6. Bring a current list of your medications and current insurance cards. 7. You MUST have a responsible person to drive you home. 8. Someone MUST be with you the first 24 hours after you arrive home or your discharge will be delayed. 9. Please wear clothes that are easy to get on and off and wear slip-on shoes.  Thank you for allowing [place] to care for you!   -- [place] [facility] Follow-Up: At [facility], you and your health needs are our priority.  As part of our continuing mission to provide you with exceptional heart care, we have created designated Provider Care Teams.  These Care Teams include your primary Cardiologist (physician) and Advanced Practice Providers (APPs -  Physician Assistants and Nurse Practitioners) who all work together to provide you with the care you need, when you need it.  Your next appointment:    2 week(s)  The format for your next appointment:   In Person  Provider:   Thomasene Ripple, DO  Other Instructions      Adopting a Healthy Lifestyle.  Know what a healthy weight is for you (roughly BMI <25) and aim to maintain this   Aim for 7+ servings of fruits and vegetables daily   65-80+ fluid ounces of water or unsweet tea for healthy kidneys   Limit to max 1 drink of alcohol per day; avoid smoking/tobacco   Limit animal fats in diet for cholesterol and heart health - choose grass fed whenever available   Avoid highly processed foods, and foods high in saturated/trans fats   Aim for low stress - take time to unwind and care for your mental health   Aim for 150 min of moderate intensity exercise weekly for heart health, and weights twice weekly for bone health   Aim for 7-9 hours of sleep daily   When it comes to diets, agreement about the perfect plan isnt easy to find, even among the experts. Experts at the [facility] [employer] developed an idea known as the Healthy Eating Plate. Just imagine a plate divided into logical, healthy portions.   The emphasis is on diet quality:   Load up on vegetables and fruits - one-half of your plate: Aim for color and variety, and remember that potatoes dont count.   Go for whole grains - one-quarter of your plate: Whole wheat, barley, wheat berries, quinoa, oats, brown rice, and foods made with them. If you want pasta, go with whole wheat pasta.   Protein  power - one-quarter of your plate: Fish, chicken, beans, and nuts are all healthy, versatile protein sources. Limit red meat.   The diet, however, does go beyond the plate, offering a few other suggestions.   Use healthy plant oils, such as olive, canola, soy, corn, sunflower and peanut. Check the labels, and avoid partially hydrogenated oil, which have unhealthy trans fats.   If youre thirsty, drink water. Coffee and tea are good in moderation, but skip sugary  drinks and limit milk and dairy products to one or two daily servings.   The type of carbohydrate in the diet is more important than the amount. Some sources of carbohydrates, such as vegetables, fruits, whole grains, and beans-are healthier than others.   Finally, stay active  Signed, Thomasene Ripple, DO  06/25/2019 12:41 PM    [facility]

## 2019-06-25 NOTE — Patient Instructions (Signed)
Medication Instructions:  Your physician has recommended you make the following change in your medication:   STOP: Amlodipine  INCREASE : Coreg (carvedilol) to 25 mg Take 1 tab twice daily   *If you need a refill on your cardiac medications before your next appointment, please call your pharmacy*  Lab Work: Your physician recommends that you return for lab work in: TODAY BMP,CBC  If you have labs (blood work) drawn today and your tests are completely normal, you will receive your results only by: Marland Kitchen MyChart Message (if you have MyChart) OR . A paper copy in the mail If you have any lab test that is abnormal or we need to change your treatment, we will call you to review the results.  Testing/Procedures: Your physician has requested that you have an echocardiogram. Echocardiography is a painless test that uses sound waves to create images of your heart. It provides your doctor with information about the size and shape of your heart and how well your heart's chambers and valves are working. This procedure takes approximately one hour. There are no restrictions for this procedure.  A zio monitor was placed today. It will remain on for 14 days. You will then return monitor and event diary in provided box. It takes 1-2 weeks for report to be downloaded and returned to [place]. We will call you with the results. If monitor falls off or has orange flashing light, please call Zio for further instructions.      [name] [address] Dept: [phone] Loc: [phone]  Bethany Stuart  06/25/2019  You are scheduled for a Cardiac Catheterization on Tuesday, December 22 with Dr. Daneen Schick.  1. Please arrive at the [facility] (Main Entrance A) at [facility]: [address] [place], [place] [address] at 7:00 AM (This time is two hours before your procedure to ensure your preparation).  Free valet parking service is available.   Special note: Every effort is made to have your procedure done on time. Please understand that emergencies sometimes delay scheduled procedures.  2. Diet: Do not eat solid foods after midnight.  The patient may have clear liquids until 5am upon the day of the procedure.  3. Labs: You will need to have a COVID screen prior to cath. Your appointment is Saturday Dec 19,2020 at 11:10 AM at [place] ,[place]  4. Medication instructions in preparation for your procedure:   Contrast Allergy: No  On the morning of your procedure, take your Aspirin and any morning medicines NOT listed above.  You may use sips of water.  5. Plan for one night stay--bring personal belongings. 6. Bring a current list of your medications and current insurance cards. 7. You MUST have a responsible person to drive you home. 8. Someone MUST be with you the first 24 hours after you arrive home or your discharge will be delayed. 9. Please wear clothes that are easy to get on and off and wear slip-on shoes.  Thank you for allowing [place] to care for you!   -- [place] [facility] services Follow-Up: At [facility], you and your health needs are our priority.  As part of our continuing mission to provide you with exceptional heart care, we have created designated Provider Care Teams.  These Care Teams include your primary Cardiologist (physician) and Advanced Practice Providers (APPs -  Physician Assistants and Nurse Practitioners) who all work  together to provide you with the care you need, when you need it.  Your next appointment:   2 week(s)  The format for your next appointment:   In Person  Provider:   Thomasene Ripple, DO  Other Instructions

## 2019-06-26 ENCOUNTER — Other Ambulatory Visit (HOSPITAL_COMMUNITY)
Admission: RE | Admit: 2019-06-26 | Discharge: 2019-06-26 | Disposition: A | Discharge status: Home / Self Care | Payer: Medicaid Other | Source: Ambulatory Visit | Attending: Interventional Cardiology | Admitting: Interventional Cardiology

## 2019-06-26 DIAGNOSIS — Z01812 Encounter for preprocedural laboratory examination: Secondary | ICD-10-CM | POA: Diagnosis not present

## 2019-06-26 DIAGNOSIS — Z20828 Contact with and (suspected) exposure to other viral communicable diseases: Secondary | ICD-10-CM | POA: Insufficient documentation

## 2019-06-26 LAB — CBC
Hematocrit: 37.9 % (ref 34.0–46.6)
Hemoglobin: 12.4 g/dL (ref 11.1–15.9)
MCH: 31.6 pg (ref 26.6–33.0)
MCHC: 32.7 g/dL (ref 31.5–35.7)
MCV: 96 fL (ref 79–97)
Platelets: 360 10*3/uL (ref 150–450)
RBC: 3.93 x10E6/uL (ref 3.77–5.28)
RDW: 13.9 % (ref 11.7–15.4)
WBC: 12.2 10*3/uL — ABNORMAL HIGH (ref 3.4–10.8)

## 2019-06-26 LAB — BASIC METABOLIC PANEL
BUN/Creatinine Ratio: 15 (ref 9–23)
BUN: 12 mg/dL (ref 6–24)
CO2: 32 mmol/L — ABNORMAL HIGH (ref 20–29)
Calcium: 9 mg/dL (ref 8.7–10.2)
Chloride: 96 mmol/L (ref 96–106)
Creatinine, Ser: 0.82 mg/dL (ref 0.57–1.00)
GFR calc Af Amer: 94 mL/min/{1.73_m2} (ref >59)
GFR calc non Af Amer: 81 mL/min/{1.73_m2} (ref >59)
Glucose: 102 mg/dL — ABNORMAL HIGH (ref 65–99)
Potassium: 3.6 mmol/L (ref 3.5–5.2)
Sodium: 142 mmol/L (ref 134–144)

## 2019-06-26 LAB — SARS CORONAVIRUS 2 (TAT 6-24 HRS): SARS Coronavirus 2: NEGATIVE

## 2019-06-28 ENCOUNTER — Telehealth: Payer: Self-pay | Admitting: *Deleted

## 2019-06-28 NOTE — Telephone Encounter (Signed)
Pt contacted pre-catheterization scheduled at [facility] for: Tuesday June 29, 2019 9 AM Verified arrival time and place: [facility]) at: 7 AM   No solid food after midnight prior to cath, clear liquids until 5 AM day of procedure. Contrast allergy: no  Hold: HCTZ-AM of procedure  Except hold medications AM meds can be  taken pre-cath with sip of water including: ASA 81 mg Plavix 75 mg  Confirmed patient has responsible adult to drive home post procedure and observe 24 hours after arriving home:  yes  Currently, due to Covid-19 pandemic, only one support person will be allowed with patient. Must be the same support person for that patient's entire stay, will be screened and required to wear a mask. They will be asked to wait in the waiting room for the duration of the patient's stay.  Patients are required to wear a mask when they enter the hospital.      COVID-19 Pre-Screening Questions:  . In the past 7 to 10 days have you had a cough,  shortness of breath, headache, congestion, fever (100 or greater) body aches, chills, sore throat, or sudden loss of taste or sense of smell? no . Have you been around anyone with known Covid 19? no . Have you been around anyone who is awaiting Covid 19 test results in the past 7 to 10 days? no . Have you been around anyone who has been exposed to Covid 19, or has mentioned symptoms of Covid 19 within the past 7 to 10 days? no  I reviewed procedure/mask/visitor instructions, Covid-19 screening questions with patient, he verbalized understanding, thanked me for call.

## 2019-06-28 NOTE — Telephone Encounter (Signed)
-----   Message from Berniece Salines, DO sent at 06/26/2019  9:31 PM EST ----- Lab stable, wbc looks  like it is improving compared to labs from recent hospitalization at [facility].

## 2019-06-28 NOTE — Telephone Encounter (Signed)
[phone] Telephone call to patient . Informed of lab results.States heart monitor has a red light blinking. Informed to call the [employer] and that she may just need to turn it in to the company.

## 2019-06-29 ENCOUNTER — Other Ambulatory Visit: Payer: Self-pay

## 2019-06-29 ENCOUNTER — Ambulatory Visit (HOSPITAL_COMMUNITY)
Admission: RE | Admit: 2019-06-29 | Discharge: 2019-06-29 | Disposition: A | Discharge status: Home / Self Care | Payer: Medicaid Other | Attending: Interventional Cardiology | Admitting: Interventional Cardiology

## 2019-06-29 ENCOUNTER — Encounter (HOSPITAL_COMMUNITY)
Admission: RE | Disposition: A | Discharge status: Home / Self Care | Payer: Medicaid Other | Source: Home / Self Care | Attending: Interventional Cardiology

## 2019-06-29 DIAGNOSIS — Z79899 Other long term (current) drug therapy: Secondary | ICD-10-CM | POA: Insufficient documentation

## 2019-06-29 DIAGNOSIS — R079 Chest pain, unspecified: Secondary | ICD-10-CM | POA: Diagnosis present

## 2019-06-29 DIAGNOSIS — F329 Major depressive disorder, single episode, unspecified: Secondary | ICD-10-CM | POA: Insufficient documentation

## 2019-06-29 DIAGNOSIS — M1990 Unspecified osteoarthritis, unspecified site: Secondary | ICD-10-CM | POA: Insufficient documentation

## 2019-06-29 DIAGNOSIS — J449 Chronic obstructive pulmonary disease, unspecified: Secondary | ICD-10-CM | POA: Diagnosis present

## 2019-06-29 DIAGNOSIS — R55 Syncope and collapse: Secondary | ICD-10-CM | POA: Insufficient documentation

## 2019-06-29 DIAGNOSIS — F17200 Nicotine dependence, unspecified, uncomplicated: Secondary | ICD-10-CM | POA: Diagnosis not present

## 2019-06-29 DIAGNOSIS — Z7982 Long term (current) use of aspirin: Secondary | ICD-10-CM | POA: Insufficient documentation

## 2019-06-29 DIAGNOSIS — I110 Hypertensive heart disease with heart failure: Secondary | ICD-10-CM | POA: Insufficient documentation

## 2019-06-29 DIAGNOSIS — Q248 Other specified congenital malformations of heart: Secondary | ICD-10-CM

## 2019-06-29 DIAGNOSIS — E782 Mixed hyperlipidemia: Secondary | ICD-10-CM | POA: Diagnosis not present

## 2019-06-29 DIAGNOSIS — I5032 Chronic diastolic (congestive) heart failure: Secondary | ICD-10-CM | POA: Diagnosis not present

## 2019-06-29 DIAGNOSIS — Z7902 Long term (current) use of antithrombotics/antiplatelets: Secondary | ICD-10-CM | POA: Diagnosis not present

## 2019-06-29 DIAGNOSIS — I25118 Atherosclerotic heart disease of native coronary artery with other forms of angina pectoris: Secondary | ICD-10-CM | POA: Insufficient documentation

## 2019-06-29 DIAGNOSIS — K219 Gastro-esophageal reflux disease without esophagitis: Secondary | ICD-10-CM | POA: Diagnosis not present

## 2019-06-29 DIAGNOSIS — Z880 Allergy status to penicillin: Secondary | ICD-10-CM | POA: Insufficient documentation

## 2019-06-29 HISTORY — PX: LEFT HEART CATH AND CORONARY ANGIOGRAPHY: CATH118249

## 2019-06-29 SURGERY — LEFT HEART CATH AND CORONARY ANGIOGRAPHY
Anesthesia: LOCAL

## 2019-06-29 MED ORDER — LIDOCAINE HCL (PF) 1 % IJ SOLN
INTRAMUSCULAR | Status: AC
  Filled 2019-06-29: qty 30

## 2019-06-29 MED ORDER — SODIUM CHLORIDE 0.9 % WEIGHT BASED INFUSION
3.0000 mL/kg/h | INTRAVENOUS | Status: AC
  Administered 2019-06-29: 09:00:00 3 mL/kg/h via INTRAVENOUS

## 2019-06-29 MED ORDER — MIDAZOLAM HCL 2 MG/2ML IJ SOLN
INTRAMUSCULAR | Status: AC
  Filled 2019-06-29: qty 2

## 2019-06-29 MED ORDER — ONDANSETRON HCL 4 MG/2ML IJ SOLN: 4.0000 mg | Freq: Four times a day (QID) | INTRAMUSCULAR | Status: DC | PRN

## 2019-06-29 MED ORDER — SODIUM CHLORIDE 0.9 % IV SOLN: INTRAVENOUS | Status: DC

## 2019-06-29 MED ORDER — FENTANYL CITRATE (PF) 100 MCG/2ML IJ SOLN
INTRAMUSCULAR | Status: DC | PRN
  Administered 2019-06-29: 25 ug via INTRAVENOUS

## 2019-06-29 MED ORDER — SODIUM CHLORIDE 0.9% FLUSH: 3.0000 mL | INTRAVENOUS | Status: DC | PRN

## 2019-06-29 MED ORDER — VERAPAMIL HCL 2.5 MG/ML IV SOLN
INTRAVENOUS | Status: AC
  Filled 2019-06-29: qty 2

## 2019-06-29 MED ORDER — ACETAMINOPHEN 325 MG PO TABS: 650.0000 mg | ORAL_TABLET | ORAL | Status: DC | PRN

## 2019-06-29 MED ORDER — HEPARIN SODIUM (PORCINE) 1000 UNIT/ML IJ SOLN
INTRAMUSCULAR | Status: AC
  Filled 2019-06-29: qty 1

## 2019-06-29 MED ORDER — ALBUTEROL SULFATE (2.5 MG/3ML) 0.083% IN NEBU
INHALATION_SOLUTION | RESPIRATORY_TRACT | Status: AC
  Filled 2019-06-29: qty 3

## 2019-06-29 MED ORDER — SODIUM CHLORIDE 0.9 % IV SOLN: 250.0000 mL | INTRAVENOUS | Status: DC | PRN

## 2019-06-29 MED ORDER — MIDAZOLAM HCL 2 MG/2ML IJ SOLN
INTRAMUSCULAR | Status: DC | PRN
  Administered 2019-06-29: 1 mg via INTRAVENOUS

## 2019-06-29 MED ORDER — VERAPAMIL HCL 2.5 MG/ML IV SOLN
INTRAVENOUS | Status: DC | PRN
  Administered 2019-06-29: 10 mL via INTRA_ARTERIAL

## 2019-06-29 MED ORDER — LIDOCAINE HCL (PF) 1 % IJ SOLN
INTRAMUSCULAR | Status: DC | PRN
  Administered 2019-06-29: 2 mL

## 2019-06-29 MED ORDER — ASPIRIN 81 MG PO CHEW: 81.0000 mg | CHEWABLE_TABLET | Freq: Every day | ORAL | Status: DC

## 2019-06-29 MED ORDER — CLOPIDOGREL BISULFATE 75 MG PO TABS: 75.0000 mg | ORAL_TABLET | Freq: Every day | ORAL | Status: DC

## 2019-06-29 MED ORDER — SODIUM CHLORIDE 0.9 % WEIGHT BASED INFUSION: 1.0000 mL/kg/h | INTRAVENOUS | Status: DC

## 2019-06-29 MED ORDER — OXYCODONE HCL 5 MG PO TABS: 5.0000 mg | ORAL_TABLET | ORAL | Status: DC | PRN

## 2019-06-29 MED ORDER — LABETALOL HCL 5 MG/ML IV SOLN
INTRAVENOUS | Status: DC | PRN
  Administered 2019-06-29 (×2): 10 mg via INTRAVENOUS

## 2019-06-29 MED ORDER — HEPARIN (PORCINE) IN NACL 1000-0.9 UT/500ML-% IV SOLN
INTRAVENOUS | Status: AC
  Filled 2019-06-29: qty 1000

## 2019-06-29 MED ORDER — HYDRALAZINE HCL 20 MG/ML IJ SOLN: 10.0000 mg | INTRAMUSCULAR | Status: DC | PRN

## 2019-06-29 MED ORDER — ALBUTEROL SULFATE (2.5 MG/3ML) 0.083% IN NEBU
2.5000 mg | INHALATION_SOLUTION | Freq: Once | RESPIRATORY_TRACT | Status: AC
  Administered 2019-06-29: 2.5 mg via RESPIRATORY_TRACT

## 2019-06-29 MED ORDER — IOHEXOL 350 MG/ML SOLN
INTRAVENOUS | Status: DC | PRN
  Administered 2019-06-29: 10:00:00 55 mL

## 2019-06-29 MED ORDER — LABETALOL HCL 5 MG/ML IV SOLN
INTRAVENOUS | Status: AC
  Filled 2019-06-29: qty 4

## 2019-06-29 MED ORDER — SODIUM CHLORIDE 0.9% FLUSH: 3.0000 mL | Freq: Two times a day (BID) | INTRAVENOUS | Status: DC

## 2019-06-29 MED ORDER — LABETALOL HCL 5 MG/ML IV SOLN
10.0000 mg | INTRAVENOUS | Status: DC | PRN
  Administered 2019-06-29: 11:00:00 10 mg via INTRAVENOUS

## 2019-06-29 MED ORDER — HEPARIN (PORCINE) IN NACL 1000-0.9 UT/500ML-% IV SOLN
INTRAVENOUS | Status: DC | PRN
  Administered 2019-06-29 (×2): 500 mL

## 2019-06-29 MED ORDER — FENTANYL CITRATE (PF) 100 MCG/2ML IJ SOLN
INTRAMUSCULAR | Status: AC
  Filled 2019-06-29: qty 2

## 2019-06-29 MED ORDER — HEPARIN SODIUM (PORCINE) 1000 UNIT/ML IJ SOLN
INTRAMUSCULAR | Status: DC | PRN
  Administered 2019-06-29: 4500 [IU] via INTRAVENOUS

## 2019-06-29 SURGICAL SUPPLY — 10 items
CATH 5FR JL3.5 JR4 ANG PIG MP (CATHETERS) ×1 IMPLANT
DEVICE RAD COMP TR BAND LRG (VASCULAR PRODUCTS) ×1 IMPLANT
GLIDESHEATH SLEND A-KIT 6F 22G (SHEATH) ×1 IMPLANT
GUIDEWIRE INQWIRE 1.5J.035X260 (WIRE) IMPLANT
INQWIRE 1.5J .035X260CM (WIRE) ×2
KIT HEART LEFT (KITS) ×2 IMPLANT
PACK CARDIAC CATHETERIZATION (CUSTOM PROCEDURE TRAY) ×2 IMPLANT
SHEATH PROBE COVER 6X72 (BAG) ×1 IMPLANT
TRANSDUCER W/STOPCOCK (MISCELLANEOUS) ×2 IMPLANT
TUBING CIL FLEX 10 FLL-RA (TUBING) ×2 IMPLANT

## 2019-06-29 NOTE — Discharge Instructions (Signed)
Radial Site Care ° °This sheet gives you information about how to care for yourself after your procedure. Your health care provider may also give you more specific instructions. If you have problems or questions, contact your health care provider. °What can I expect after the procedure? °After the procedure, it is common to have: °· Bruising and tenderness at the catheter insertion area. °Follow these instructions at home: °Medicines °· Take over-the-counter and prescription medicines only as told by your health care provider. °Insertion site care °· Follow instructions from your health care provider about how to take care of your insertion site. Make sure you: °? Wash your hands with soap and water before you change your bandage (dressing). If soap and water are not available, use hand sanitizer. °? Change your dressing as told by your health care provider. °? Leave stitches (sutures), skin glue, or adhesive strips in place. These skin closures may need to stay in place for 2 weeks or longer. If adhesive strip edges start to loosen and curl up, you may trim the loose edges. Do not remove adhesive strips completely unless your health care provider tells you to do that. °· Check your insertion site every day for signs of infection. Check for: °? Redness, swelling, or pain. °? Fluid or blood. °? Pus or a bad smell. °? Warmth. °· Do not take baths, swim, or use a hot tub until your health care provider approves. °· You may shower 24-48 hours after the procedure, or as directed by your health care provider. °? Remove the dressing and gently wash the site with plain soap and water. °? Pat the area dry with a clean towel. °? Do not rub the site. That could cause bleeding. °· Do not apply powder or lotion to the site. °Activity ° °· For 24 hours after the procedure, or as directed by your health care provider: °? Do not flex or bend the affected arm. °? Do not push or pull heavy objects with the affected arm. °? Do not  drive yourself home from the hospital or clinic. You may drive 24 hours after the procedure unless your health care provider tells you not to. °? Do not operate machinery or power tools. °· Do not lift anything that is heavier than 10 lb (4.5 kg), or the limit that you are told, until your health care provider says that it is safe. °· Ask your health care provider when it is okay to: °? Return to work or school. °? Resume usual physical activities or sports. °? Resume sexual activity. °General instructions °· If the catheter site starts to bleed, raise your arm and put firm pressure on the site. If the bleeding does not stop, get help right away. This is a medical emergency. °· If you went home on the same day as your procedure, a responsible adult should be with you for the first 24 hours after you arrive home. °· Keep all follow-up visits as told by your health care provider. This is important. °Contact a health care provider if: °· You have a fever. °· You have redness, swelling, or yellow drainage around your insertion site. °Get help right away if: °· You have unusual pain at the radial site. °· The catheter insertion area swells very fast. °· The insertion area is bleeding, and the bleeding does not stop when you hold steady pressure on the area. °· Your arm or hand becomes pale, cool, tingly, or numb. °These symptoms may represent a serious problem   that is an emergency. Do not wait to see if the symptoms will go away. Get medical help right away. Call your local emergency services (911 in the [place] Do not drive yourself to the hospital. °Summary °· After the procedure, it is common to have bruising and tenderness at the site. °· Follow instructions from your health care provider about how to take care of your radial site wound. Check the wound every day for signs of infection. °· Do not lift anything that is heavier than 10 lb (4.5 kg), or the limit that you are told, until your health care provider says  that it is safe. °This information is not intended to replace advice given to you by your health care provider. Make sure you discuss any questions you have with your health care provider. °Document Released: 07/27/2010 Document Revised: 07/30/2017 Document Reviewed: 07/30/2017 °Elsevier Patient Education © 2020 [employer] ° °

## 2019-06-29 NOTE — Interval H&P Note (Signed)
Cath Lab Visit (complete for each Cath Lab visit)  Clinical Evaluation Leading to the Procedure:   ACS: No.  Non-ACS:    Anginal Classification: CCS III  Anti-ischemic medical therapy: Minimal Therapy (1 class of medications)  Non-Invasive Test Results: No non-invasive testing performed  Prior CABG: No previous CABG      History and Physical Interval Note:  06/29/2019 9:26 AM  Bethany Stuart  has presented today for surgery, with the diagnosis of cad   cp.  The various methods of treatment have been discussed with the patient and family. After consideration of risks, benefits and other options for treatment, the patient has consented to  Procedure(s): LEFT HEART CATH AND CORONARY ANGIOGRAPHY (N/A) as a surgical intervention.  The patient's history has been reviewed, patient examined, no change in status, stable for surgery.  I have reviewed the patient's chart and labs.  Questions were answered to the patient's satisfaction.     Belva Crome III

## 2019-06-29 NOTE — CV Procedure (Addendum)
   Left heart cath with coronary angio via right radial using real-time vascular ultrasound for guidance.  Normal left main  Normal LAD  Normal circumflex  Mid RCA 40 to 50% eccentric stenosis previously noted to be 65% with negative DFR 2019.  Unable to record an intracavitary gradient.  LVEF greater than 70% with end-diastolic pressure 14 mmHg.  Recommendation: Aggressive risk factor modification and if intracavitary gradient documented by echo consider negative inotropic calcium channel blockers and beta-blocker therapy.Marland Kitchen

## 2019-07-06 ENCOUNTER — Ambulatory Visit: Payer: Medicaid Other | Admitting: Cardiology

## 2019-07-08 ENCOUNTER — Ambulatory Visit (INDEPENDENT_AMBULATORY_CARE_PROVIDER_SITE_OTHER): Payer: Medicaid Other | Admitting: Cardiology

## 2019-07-08 ENCOUNTER — Other Ambulatory Visit: Payer: Self-pay

## 2019-07-08 ENCOUNTER — Encounter: Payer: Self-pay | Admitting: Cardiology

## 2019-07-08 VITALS — BP 160/90 | HR 80 | Ht 67.0 in | Wt 196.0 lb

## 2019-07-08 DIAGNOSIS — Q248 Other specified congenital malformations of heart: Secondary | ICD-10-CM | POA: Diagnosis not present

## 2019-07-08 DIAGNOSIS — I2510 Atherosclerotic heart disease of native coronary artery without angina pectoris: Secondary | ICD-10-CM

## 2019-07-08 DIAGNOSIS — J449 Chronic obstructive pulmonary disease, unspecified: Secondary | ICD-10-CM

## 2019-07-08 DIAGNOSIS — I10 Essential (primary) hypertension: Secondary | ICD-10-CM | POA: Diagnosis not present

## 2019-07-08 DIAGNOSIS — E782 Mixed hyperlipidemia: Secondary | ICD-10-CM

## 2019-07-08 MED ORDER — FLUTICASONE FUROATE-VILANTEROL 100-25 MCG/INH IN AEPB
1.00 | INHALATION_SPRAY | RESPIRATORY_TRACT | Status: DC
Start: 2019-07-08 — End: 2019-07-08

## 2019-07-08 MED ORDER — ALBUTEROL SULFATE (2.5 MG/3ML) 0.083% IN NEBU
2.50 | INHALATION_SOLUTION | RESPIRATORY_TRACT | Status: DC
Start: ? — End: 2019-07-08

## 2019-07-08 MED ORDER — PREDNISONE 20 MG PO TABS
60.00 | ORAL_TABLET | ORAL | Status: DC
Start: 2019-07-08 — End: 2019-07-08

## 2019-07-08 MED ORDER — POTASSIUM CHLORIDE CRYS ER 10 MEQ PO TBCR
10.00 | EXTENDED_RELEASE_TABLET | ORAL | Status: DC
Start: 2019-07-08 — End: 2019-07-08

## 2019-07-08 MED ORDER — ATORVASTATIN CALCIUM 40 MG PO TABS
40.00 | ORAL_TABLET | ORAL | Status: DC
Start: 2019-07-07 — End: 2019-07-08

## 2019-07-08 MED ORDER — FLUOXETINE HCL 20 MG PO CAPS
20.00 | ORAL_CAPSULE | ORAL | Status: DC
Start: 2019-07-07 — End: 2019-07-08

## 2019-07-08 MED ORDER — DILTIAZEM HCL ER COATED BEADS 120 MG PO CP24: 120.0000 mg | ORAL_CAPSULE | Freq: Every day | ORAL | 1 refills | Status: DC

## 2019-07-08 MED ORDER — ASPIRIN 81 MG PO CHEW
81.00 | CHEWABLE_TABLET | ORAL | Status: DC
Start: 2019-07-08 — End: 2019-07-08

## 2019-07-08 MED ORDER — IPRATROPIUM-ALBUTEROL 0.5-2.5 (3) MG/3ML IN SOLN
3.00 | RESPIRATORY_TRACT | Status: DC
Start: 2019-07-07 — End: 2019-07-08

## 2019-07-08 MED ORDER — GENERIC EXTERNAL MEDICATION
Status: DC
Start: ? — End: 2019-07-08

## 2019-07-08 MED ORDER — DSS 100 MG PO CAPS
100.00 | ORAL_CAPSULE | ORAL | Status: DC
Start: 2019-07-07 — End: 2019-07-08

## 2019-07-08 MED ORDER — NITROGLYCERIN 0.4 MG SL SUBL
0.40 | SUBLINGUAL_TABLET | SUBLINGUAL | Status: DC
Start: ? — End: 2019-07-08

## 2019-07-08 MED ORDER — NICOTINE 14 MG/24HR TD PT24
1.00 | MEDICATED_PATCH | TRANSDERMAL | Status: DC
Start: 2019-07-08 — End: 2019-07-08

## 2019-07-08 MED ORDER — AZITHROMYCIN 250 MG PO TABS
250.00 | ORAL_TABLET | ORAL | Status: DC
Start: 2019-07-08 — End: 2019-07-08

## 2019-07-08 MED ORDER — CARVEDILOL 25 MG PO TABS: 25.0000 mg | ORAL_TABLET | Freq: Two times a day (BID) | ORAL | 1 refills | Status: AC

## 2019-07-08 MED ORDER — CARVEDILOL 25 MG PO TABS
25.00 | ORAL_TABLET | ORAL | Status: DC
Start: 2019-07-07 — End: 2019-07-08

## 2019-07-08 MED ORDER — TRAZODONE HCL 50 MG PO TABS
100.00 | ORAL_TABLET | ORAL | Status: DC
Start: 2019-07-07 — End: 2019-07-08

## 2019-07-08 MED ORDER — ALPRAZOLAM 0.5 MG PO TABS
0.50 | ORAL_TABLET | ORAL | Status: DC
Start: ? — End: 2019-07-08

## 2019-07-08 MED ORDER — HYDROCHLOROTHIAZIDE 25 MG PO TABS: 25.0000 mg | ORAL_TABLET | Freq: Every day | ORAL | 1 refills | Status: DC

## 2019-07-08 MED ORDER — OXYCODONE-ACETAMINOPHEN 5-325 MG PO TABS
1.00 | ORAL_TABLET | ORAL | Status: DC
Start: ? — End: 2019-07-08

## 2019-07-08 MED ORDER — POLYETHYLENE GLYCOL 3350 17 GM/SCOOP PO POWD
17.00 | ORAL | Status: DC
Start: 2019-07-07 — End: 2019-07-08

## 2019-07-08 MED ORDER — ENOXAPARIN SODIUM 40 MG/0.4ML ~~LOC~~ SOLN
40.00 | SUBCUTANEOUS | Status: DC
Start: 2019-07-07 — End: 2019-07-08

## 2019-07-08 MED ORDER — CLOPIDOGREL BISULFATE 75 MG PO TABS
75.00 | ORAL_TABLET | ORAL | Status: DC
Start: 2019-07-08 — End: 2019-07-08

## 2019-07-08 MED ORDER — SENNOSIDES 8.6 MG PO TABS
2.00 | ORAL_TABLET | ORAL | Status: DC
Start: 2019-07-07 — End: 2019-07-08

## 2019-07-08 MED ORDER — HYDROCHLOROTHIAZIDE 25 MG PO TABS
25.00 | ORAL_TABLET | ORAL | Status: DC
Start: 2019-07-08 — End: 2019-07-08

## 2019-07-08 NOTE — Progress Notes (Signed)
Cardiology Office Note:    Date:  07/08/2019   ID:  Bethany Stuart, DOB 04/13/1966, MRN [id number]  PCP:  Eloisa NorthernAmin, Saad, MD  Cardiologist:  Thomasene RippleKardie Breylen Agyeman, DO  Electrophysiologist:  None   Referring MD: Eloisa NorthernAmin, Saad, MD   Follow up visit  History of Present Illness:    Bethany Stuart is a 53 y.o. female with a hx of moderate nonobstructive coronary artery disease with her most recent left heart cath in December 2019 which showed a 40% proximal and 60% mid RCA lesion with a 30% LAD lesion, hypertension, diastolic heart failure, GERD, history of LVOT obstruction with a gradient of 146 mmHg on echo performed in November 2018.  Subsequently in December 2019 she had an echocardiogram performed with no noted LVOT obstruction.   I saw the patient on 06/25/2019 after she had been [facility] after she had a syncopal event.  During admission she was noted to have a urinary tract infection concern for septic shock treated with IV antibiotics.  While in the hospital she complained of chest pain and did have changes in her EKG suggesting ischemia in the inferior lateral wall.  She was treated medically.  During her hospitalization the patient also had an echocardiogram which was a technically difficult study however was also concerning for left LVOT obstruction with a gradient of 196 mmHg.  Due to LV obstruction creatinine being so dynamic independent on fluid state/preload it was recommended the patient be reevaluated after her infection at which time she was also dehydrated.  On her visit we discussed repeating cardiac catheterization to rule out progression of her coronary artery disease as well as repeat an echocardiogram.  She did undergo the cardiac catheterization which did not require any intervention.  Her record she was [facility] for COPD exacerbation.  Per notes from her hospital stay she was hypertensive.  She is here today for follow-up visit   Past Medical  History:  Diagnosis Date  . Arthritis    Back  . Bulging lumbar disc   . COPD (chronic obstructive pulmonary disease) (HCC)   . Depression   . Hypertension   . Sciatica     Past Surgical History:  Procedure Laterality Date  . ABDOMINAL HYSTERECTOMY    . APPENDECTOMY    . CHOLECYSTECTOMY    . FOOT SURGERY    . INTRAVASCULAR PRESSURE WIRE/FFR STUDY N/A 06/15/2018   Procedure: INTRAVASCULAR PRESSURE WIRE/FFR STUDY;  Surgeon: Elder NegusPatwardhan, Manish J, MD;  Location: [facility] INVASIVE CV LAB;  Service: Cardiovascular;  Laterality: N/A;  . KNEE SURGERY Right   . LEFT HEART CATH AND CORONARY ANGIOGRAPHY N/A 06/15/2018   Procedure: LEFT HEART CATH AND CORONARY ANGIOGRAPHY;  Surgeon: Elder NegusPatwardhan, Manish J, MD;  Location: [facility] INVASIVE CV LAB;  Service: Cardiovascular;  Laterality: N/A;  . LEFT HEART CATH AND CORONARY ANGIOGRAPHY N/A 06/29/2019   Procedure: LEFT HEART CATH AND CORONARY ANGIOGRAPHY;  Surgeon: Lyn RecordsSmith, Henry W, MD;  Location: [facility] INVASIVE CV LAB;  Service: Cardiovascular;  Laterality: N/A;    Current Medications: Current Meds  Medication Sig  . ALPRAZolam (XANAX) 0.5 MG tablet Take 0.5 mg by mouth daily as needed for anxiety.  Marland Kitchen. aspirin EC 81 MG EC tablet Take 1 tablet (81 mg total) by mouth daily.  Marland Kitchen. atorvastatin (LIPITOR) 40 MG tablet Take 40 mg by mouth daily.  Marland Kitchen. azithromycin (ZITHROMAX) 250 MG tablet Take by mouth.  . budesonide-formoterol (SYMBICORT) 160-4.5 MCG/ACT inhaler Inhale into the lungs.  .Marland Kitchen  carvedilol (COREG) 25 MG tablet Take 1 tablet (25 mg total) by mouth 2 (two) times daily.  . clopidogrel (PLAVIX) 75 MG tablet Take 1 tablet (75 mg total) by mouth daily. Continue aspirin and Plavix together for 1 year  . FLUoxetine (PROZAC) 20 MG tablet Take 20 mg by mouth daily.  . hydrochlorothiazide (HYDRODIURIL) 25 MG tablet Take 25 mg by mouth daily.  Marland Kitchen ipratropium-albuterol (DUONEB) 0.5-2.5 (3) MG/3ML SOLN Take 3 mLs by nebulization every 4 (four) hours as needed.  . nicotine  (NICODERM CQ - DOSED IN MG/24 HOURS) 14 mg/24hr patch Place onto the skin.  Marland Kitchen oxyCODONE-acetaminophen (PERCOCET/ROXICET) 5-325 MG tablet Take by mouth.  . potassium chloride (KLOR-CON) 10 MEQ tablet Take by mouth.  . predniSONE (DELTASONE) 20 MG tablet Take by mouth.  . traZODone (DESYREL) 100 MG tablet Take 100 mg by mouth at bedtime.     Allergies:   Fioricet [butalbital-apap-caffeine], Klonopin [clonazepam], and Penicillins   Social History   Socioeconomic History  . Marital status: Single    Spouse name: Not on file  . Number of children: Not on file  . Years of education: Not on file  . Highest education level: Not on file  Occupational History  . Not on file  Tobacco Use  . Smoking status: Current Every Day Smoker  . Smokeless tobacco: Never Used  Substance and Sexual Activity  . Alcohol use: Yes  . Drug use: No  . Sexual activity: Not on file  Other Topics Concern  . Not on file  Social History Narrative  . Not on file   Social Determinants of Health   Financial Resource Strain:   . Difficulty of Paying Living Expenses: Not on file  Food Insecurity:   . Worried About Programme researcher, broadcasting/film/video in the Last Year: Not on file  . Ran Out of Food in the Last Year: Not on file  Transportation Needs:   . Lack of Transportation (Medical): Not on file  . Lack of Transportation (Non-Medical): Not on file  Physical Activity:   . Days of Exercise per Week: Not on file  . Minutes of Exercise per Session: Not on file  Stress:   . Feeling of Stress : Not on file  Social Connections:   . Frequency of Communication with Friends and Family: Not on file  . Frequency of Social Gatherings with Friends and Family: Not on file  . Attends Religious Services: Not on file  . Active Member of Clubs or Organizations: Not on file  . Attends Banker Meetings: Not on file  . Marital Status: Not on file     Family History: The patient's family history includes CAD in her mother;  Diabetes in her maternal grandmother; Emphysema in her father; Fibromyalgia in her sister and sister; Heart attack in her brother and maternal grandfather; Hypertension in her mother; Pneumonia in her mother.  ROS:   Review of Systems  Constitution: Negative for decreased appetite, fever and weight gain.  HENT: Negative for congestion, ear discharge, hoarse voice and sore throat.   Eyes: Negative for discharge, redness, vision loss in right eye and visual halos.  Cardiovascular: Negative for chest pain, dyspnea on exertion, leg swelling, orthopnea and palpitations.  Respiratory: Negative for cough, hemoptysis, shortness of breath and snoring.   Endocrine: Negative for heat intolerance and polyphagia.  Hematologic/Lymphatic: Negative for bleeding problem. Does not bruise/bleed easily.  Skin: Negative for flushing, nail changes, rash and suspicious lesions.  Musculoskeletal: Negative for arthritis,  joint pain, muscle cramps, myalgias, neck pain and stiffness.  Gastrointestinal: Negative for abdominal pain, bowel incontinence, diarrhea and excessive appetite.  Genitourinary: Negative for decreased libido, genital sores and incomplete emptying.  Neurological: Negative for brief paralysis, focal weakness, headaches and loss of balance.  Psychiatric/Behavioral: Negative for altered mental status, depression and suicidal ideas.  Allergic/Immunologic: Negative for HIV exposure and persistent infections.    EKGs/Labs/Other Studies Reviewed:    The following studies were reviewed today:   EKG: None today  Transthoracic echocardiogram done in November 2020: Mild concentric left ventricular hypertrophy.  Left ventricle ejection fraction 60 to 65%.  The aortic valve was not well visualized however the severe aortic stenosis presence of valvular structure cannot be ruled out.  Mean gradient across the the outflow track 196 mmHg.  Trace to mild mitral regurgitation.  Trace tricuspid  regurgitation.  Transthoracic echocardiogram December 2019 Conclusions - Left ventricle: The cavity size was normal. Wall thickness was normal. Systolic function was normal. The estimated ejection fraction was in the range of 55% to 65%. Wall motion was normal; there were no regional wall motion abnormalities. Doppler parameters are consistent with abnormal left ventricular relaxation (grade 1 diastolic dysfunction). - No significant valvular abnormality. - No evidence of pulmonary hypertension. Recent Labs: 06/25/2019: BUN 12; Creatinine, Ser 0.82; Hemoglobin 12.4; Platelets 360; Potassium 3.6; Sodium 142  Recent Lipid Panel    Component Value Date/Time   CHOL 165 06/14/2018 2326   TRIG 191 (H) 06/14/2018 2326   HDL 64 06/14/2018 2326   CHOLHDL 2.6 06/14/2018 2326   VLDL 38 06/14/2018 2326   LDLCALC 63 06/14/2018 2326    Physical Exam:    VS:  BP (!) 160/90 (BP Location: Right Arm)   Pulse 80   Ht 5\' 7"  (1.702 m)   Wt 196 lb (88.9 kg)   BMI 30.70 kg/m     Wt Readings from Last 3 Encounters:  07/08/19 196 lb (88.9 kg)  06/29/19 196 lb (88.9 kg)  06/25/19 196 lb 3.2 oz (89 kg)     GEN: Well nourished, well developed in no acute distress HEENT: Normal NECK: No JVD; No carotid bruits LYMPHATICS: No lymphadenopathy CARDIAC: S1S2 noted,RRR, no murmurs, rubs, gallops RESPIRATORY:  Clear to auscultation without rales, wheezing or rhonchi  ABDOMEN: Soft, non-tender, non-distended, +bowel sounds, no guarding. EXTREMITIES: No edema, No cyanosis, no clubbing MUSCULOSKELETAL:  No edema; No deformity  SKIN: Warm and dry NEUROLOGIC:  Alert and oriented x 3, non-focal PSYCHIATRIC:  Normal affect, good insight  ASSESSMENT:    1. Coronary artery disease involving native coronary artery of native heart without angina pectoris   2. Mixed hyperlipidemia   3. Essential hypertension   4. Left ventricular outflow tract obstruction    PLAN:    Her blood pressure is  improving but yet still elevated today is 160/90 mmHg (compared to 2 weeks ago which was 190/102 millimeters mercury).  I am going to add Cardizem 120 mg daily to her already prescribed carvedilol 25 mg daily, hydrochlorothiazide  25 mg daily.  Will be cautious with medication that would lead to a vasodilatory effect which would include but not limited to ACE inhibitor's, hydralazine, and amlodipine.  Her echocardiogram is pending she has been scheduled for February however we will defer the patient since we have a sooner slot available.  She does not follow with pulmonary in her recent hospitalization for COPD exacerbation she has interesting been referred to pulmonary therefore referral will be placed in at this  time.  Coronary disease-continue patient on her current occasions with aspirin 81 mg a day, Plavix 75 mg daily, atorvastatin 40 mg daily.  Status post recent left heart cath with no indication for PCI.  The patient is in agreement with the above plan. The patient left the office in stable condition.  The patient will follow up in 1 month   Medication Adjustments/Labs and Tests Ordered: Current medicines are reviewed at length with the patient today.  Concerns regarding medicines are outlined above.  No orders of the defined types were placed in this encounter.  No orders of the defined types were placed in this encounter.   There are no Patient Instructions on file for this visit.   Adopting a Healthy Lifestyle.  Know what a healthy weight is for you (roughly BMI <25) and aim to maintain this   Aim for 7+ servings of fruits and vegetables daily   65-80+ fluid ounces of water or unsweet tea for healthy kidneys   Limit to max 1 drink of alcohol per day; avoid smoking/tobacco   Limit animal fats in diet for cholesterol and heart health - choose grass fed whenever available   Avoid highly processed foods, and foods high in saturated/trans fats   Aim for low stress - take time  to unwind and care for your mental health   Aim for 150 min of moderate intensity exercise weekly for heart health, and weights twice weekly for bone health   Aim for 7-9 hours of sleep daily   When it comes to diets, agreement about the perfect plan isnt easy to find, even among the experts. Experts at the [facility] [employer] developed an idea known as the Healthy Eating Plate. Just imagine a plate divided into logical, healthy portions.   The emphasis is on diet quality:   Load up on vegetables and fruits - one-half of your plate: Aim for color and variety, and remember that potatoes dont count.   Go for whole grains - one-quarter of your plate: Whole wheat, barley, wheat berries, quinoa, oats, brown rice, and foods made with them. If you want pasta, go with whole wheat pasta.   Protein power - one-quarter of your plate: Fish, chicken, beans, and nuts are all healthy, versatile protein sources. Limit red meat.   The diet, however, does go beyond the plate, offering a few other suggestions.   Use healthy plant oils, such as olive, canola, soy, corn, sunflower and peanut. Check the labels, and avoid partially hydrogenated oil, which have unhealthy trans fats.   If youre thirsty, drink water. Coffee and tea are good in moderation, but skip sugary drinks and limit milk and dairy products to one or two daily servings.   The type of carbohydrate in the diet is more important than the amount. Some sources of carbohydrates, such as vegetables, fruits, whole grains, and beans-are healthier than others.   Finally, stay active  Signed, Thomasene Ripple, DO  07/08/2019 11:25 AM    [facility]

## 2019-07-08 NOTE — Addendum Note (Signed)
Addended by: Particia Nearing B on: 07/08/2019 11:44 AM   Modules accepted: Orders

## 2019-07-08 NOTE — Patient Instructions (Signed)
Medication Instructions:  Your physician has recommended you make the following change in your medication:   START: Cardizem CD (diltiazem) 120 mg Take 1 tab daily  *If you need a refill on your cardiac medications before your next appointment, please call your pharmacy*  Lab Work: None If you have labs (blood work) drawn today and your tests are completely normal, you will receive your results only by: Marland Kitchen MyChart Message (if you have MyChart) OR . A paper copy in the mail If you have any lab test that is abnormal or we need to change your treatment, we will call you to review the results.  Testing/Procedures: None  Follow-Up: At [facility] you and your health needs are our priority.  As part of our continuing mission to provide you with exceptional heart care, we have created designated Provider Care Teams.  These Care Teams include your primary Cardiologist (physician) and Advanced Practice Providers (APPs -  Physician Assistants and Nurse Practitioners) who all work together to provide you with the care you need, when you need it.  Your next appointment:   1 month(s)  The format for your next appointment:   In Person  Provider:   Berniece Salines, DO  Other Instructions You are being referred to Dr Alcide Clever (a pulmonologist). They will contact you with a date and time for an appointment.

## 2019-07-19 ENCOUNTER — Telehealth: Payer: Self-pay | Admitting: Cardiology

## 2019-07-19 NOTE — Telephone Encounter (Signed)
Attempted to contact pt and call will not go thru Will try later .Bethany Stuart

## 2019-07-19 NOTE — Telephone Encounter (Signed)
Telephone call back from patient. States she is out of nebulizer's and need a refill. Explained we do not prescribe this medication and she will need to call her PCP. Have talked to Nell at Dr Chodri's office who found her referral and will call her today.

## 2019-07-19 NOTE — Telephone Encounter (Signed)
Please call patient about her having issues with breathing and not having any success with her inhaler that we called in, also she has not heard from them yet.

## 2019-07-28 IMAGING — CT CT ANGIO CHEST
2 of 8 series · 19 of 36 positions shown · IV contrast (iopamidol)
Comparison: Chest radiographs obtained earlier today. Chest,
abdomen and pelvis CTA dated 01/13/2017.

CLINICAL DATA: Shortness of breath. COPD. Central chest pain.
Elevated D-dimer.

EXAM:
CT ANGIOGRAPHY CHEST WITH CONTRAST
TECHNIQUE: Multidetector CT imaging of the chest was performed using the
standard protocol during bolus administration of intravenous
contrast. Multiplanar CT image reconstructions and MIPs were
obtained to evaluate the vascular anatomy.
CONTRAST:  81mL [id number] IOPAMIDOL ([id number]) INJECTION 76%

[Series 7: pe thins · axial · 0.77mm/px · z∈[+901,+1189]mm · 18 of 322 slices shown]
[im 17/322  lung]
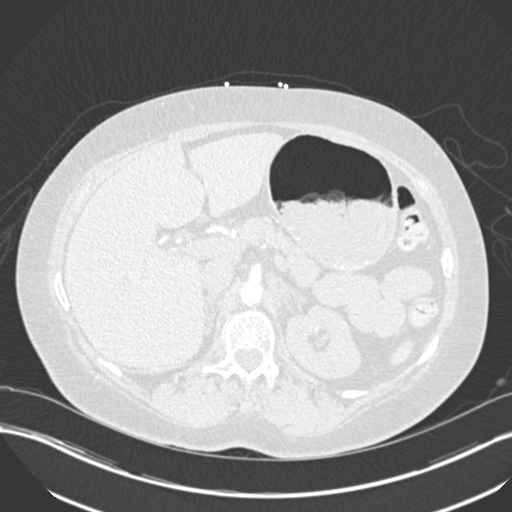
[im 34/322  mediastinal]
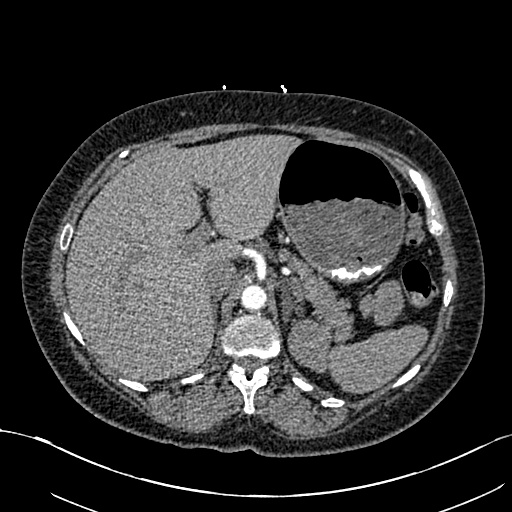
[im 51/322  lung]
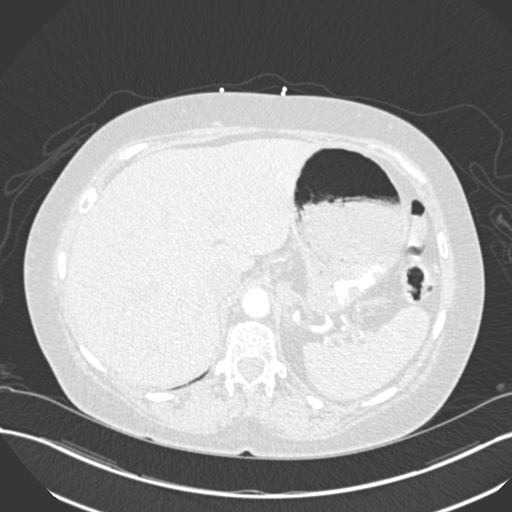
[im 68/322  mediastinal]
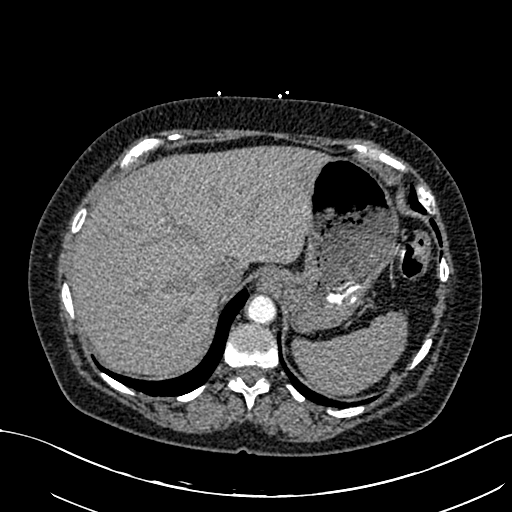
[im 85/322  lung]
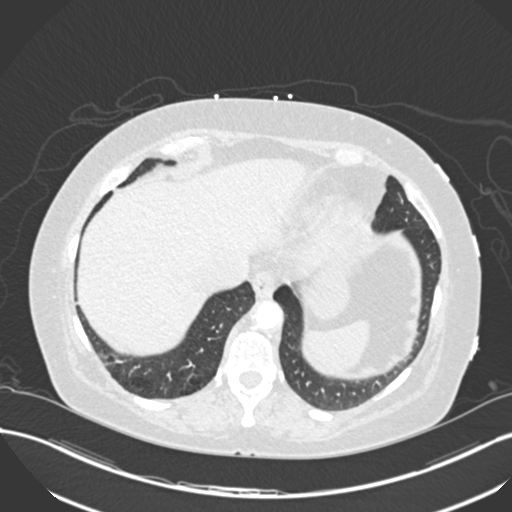
[im 102/322  mediastinal]
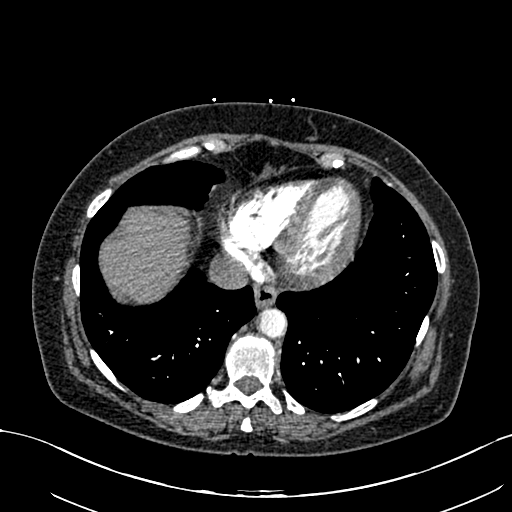
[im 119/322  lung]
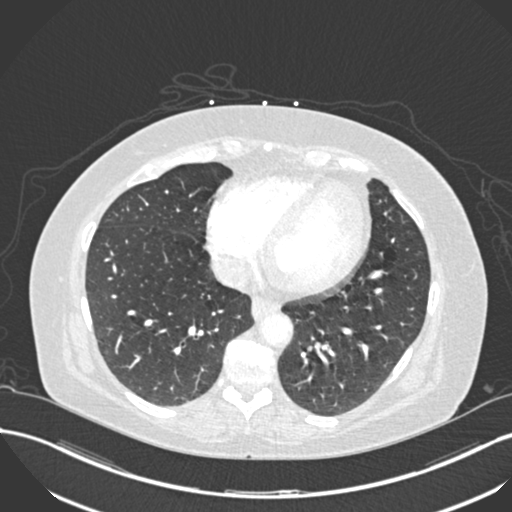
[im 136/322  mediastinal]
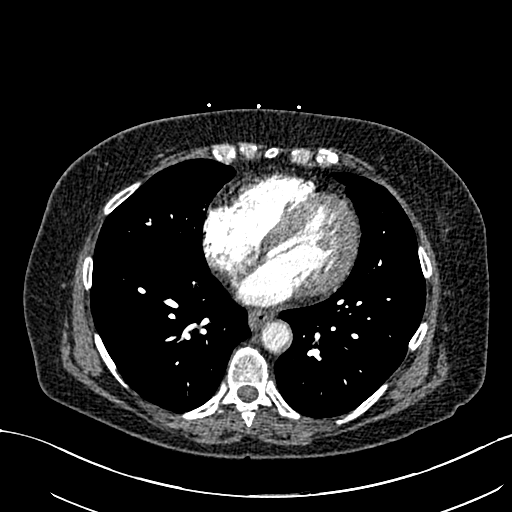
[im 153/322  lung]
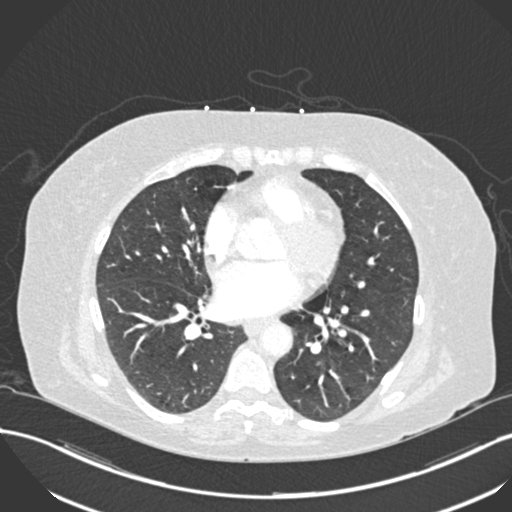
[im 169/322  mediastinal]
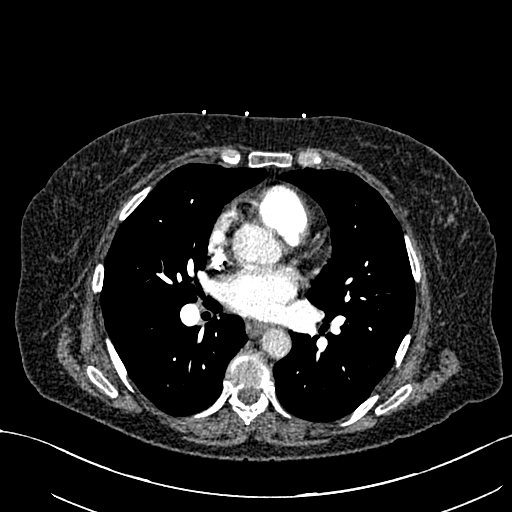
[im 186/322  lung]
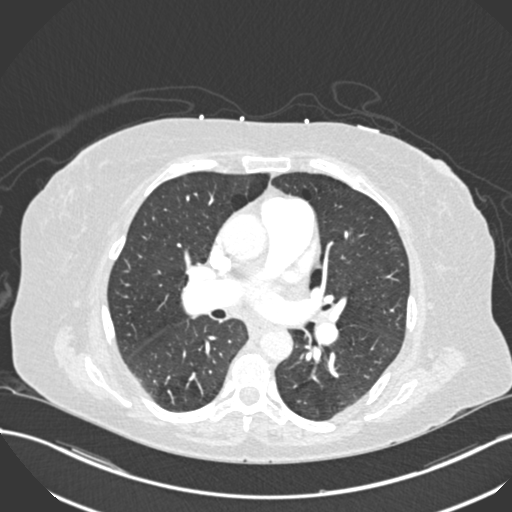
[im 203/322  mediastinal]
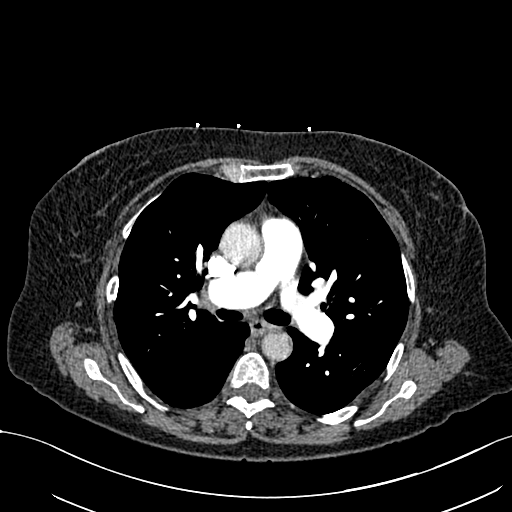
[im 220/322  lung]
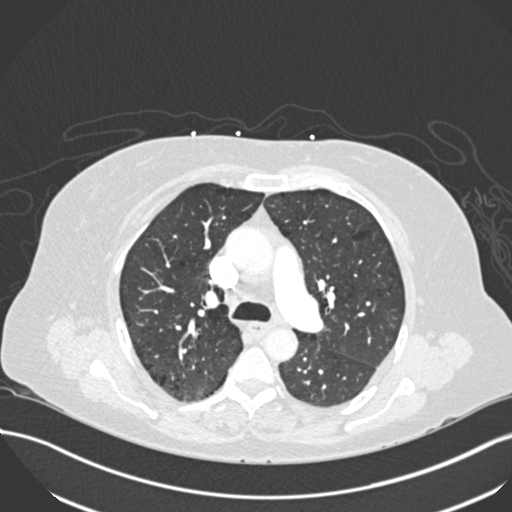
[im 237/322  mediastinal]
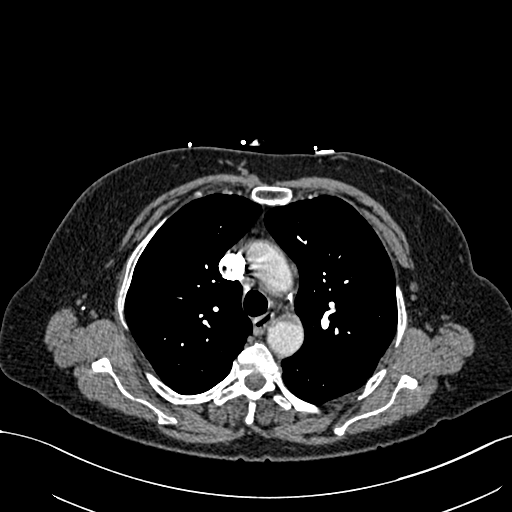
[im 254/322  lung]
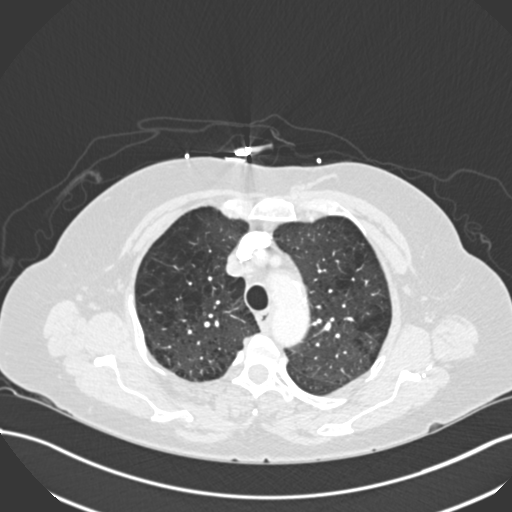
[im 271/322  mediastinal]
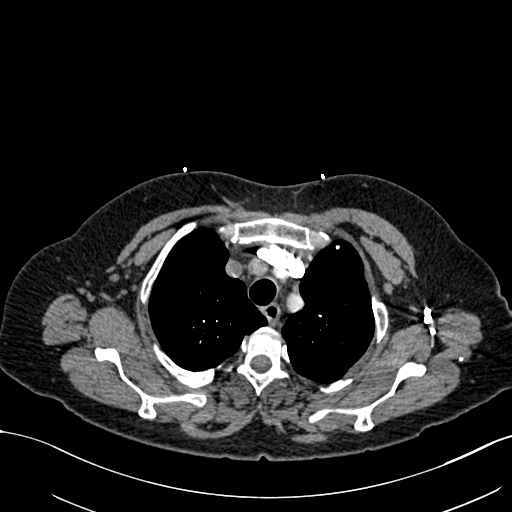
[im 288/322  lung]
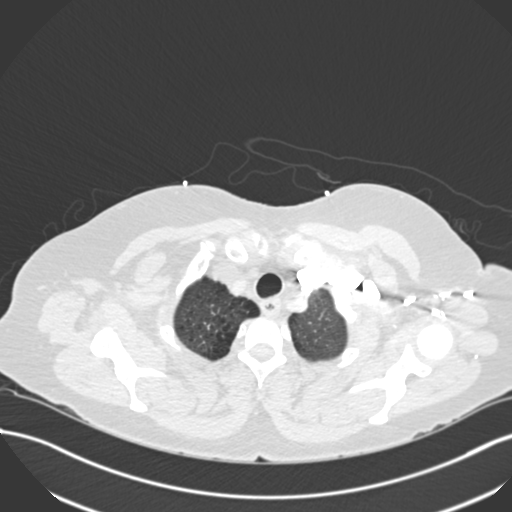
[im 305/322  mediastinal]
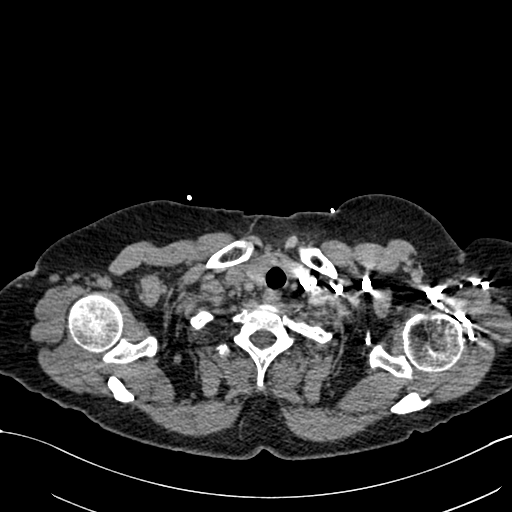

[Series 8: pe coronal mpr · coronal · 0.62mm/px · 1 of 139 slices shown]
[im 70/139  mediastinal]
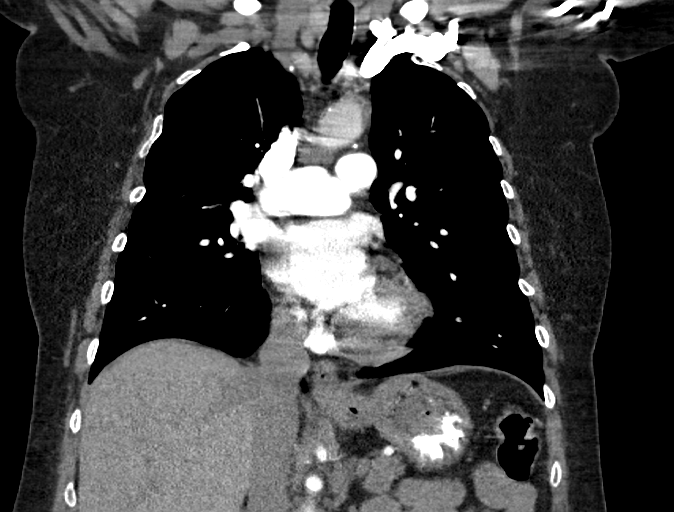

[19 of 36 positions shown; findings below may reference images not displayed]

FINDINGS: Cardiovascular: Normally opacified pulmonary arteries with no
pulmonary arterial filling defects seen. Mild atheromatous arterial
calcifications, including the thoracic aorta.

Mediastinum/Nodes: No enlarged mediastinal, hilar, or axillary lymph
nodes. Thyroid gland, trachea, and esophagus demonstrate no
significant findings.

Lungs/Pleura: Moderate right and mild-to-moderate left bullous
changes. No lung nodules or pleural fluid.

Upper Abdomen: Cholecystectomy clips.

Musculoskeletal: Thoracic spine degenerative changes.

Review of the MIP images confirms the above findings.
IMPRESSION: 1. No pulmonary emboli or acute abnormality.
2. COPD.

Aortic Atherosclerosis (2OYWW-EAQ.Q) and Emphysema ([id number].B).

## 2019-07-31 IMAGING — DX DG CHEST 1V
1 series · 1 of 1 positions shown · non-contrast
Comparison: Chest x-ray and chest CT scan June 14, 2018

CLINICAL DATA: Weakness and shortness of breath. Acute on chronic
respiratory failure with hypoxia, COPD, current smoker.

EXAM:
CHEST  1 VIEW

[chest]
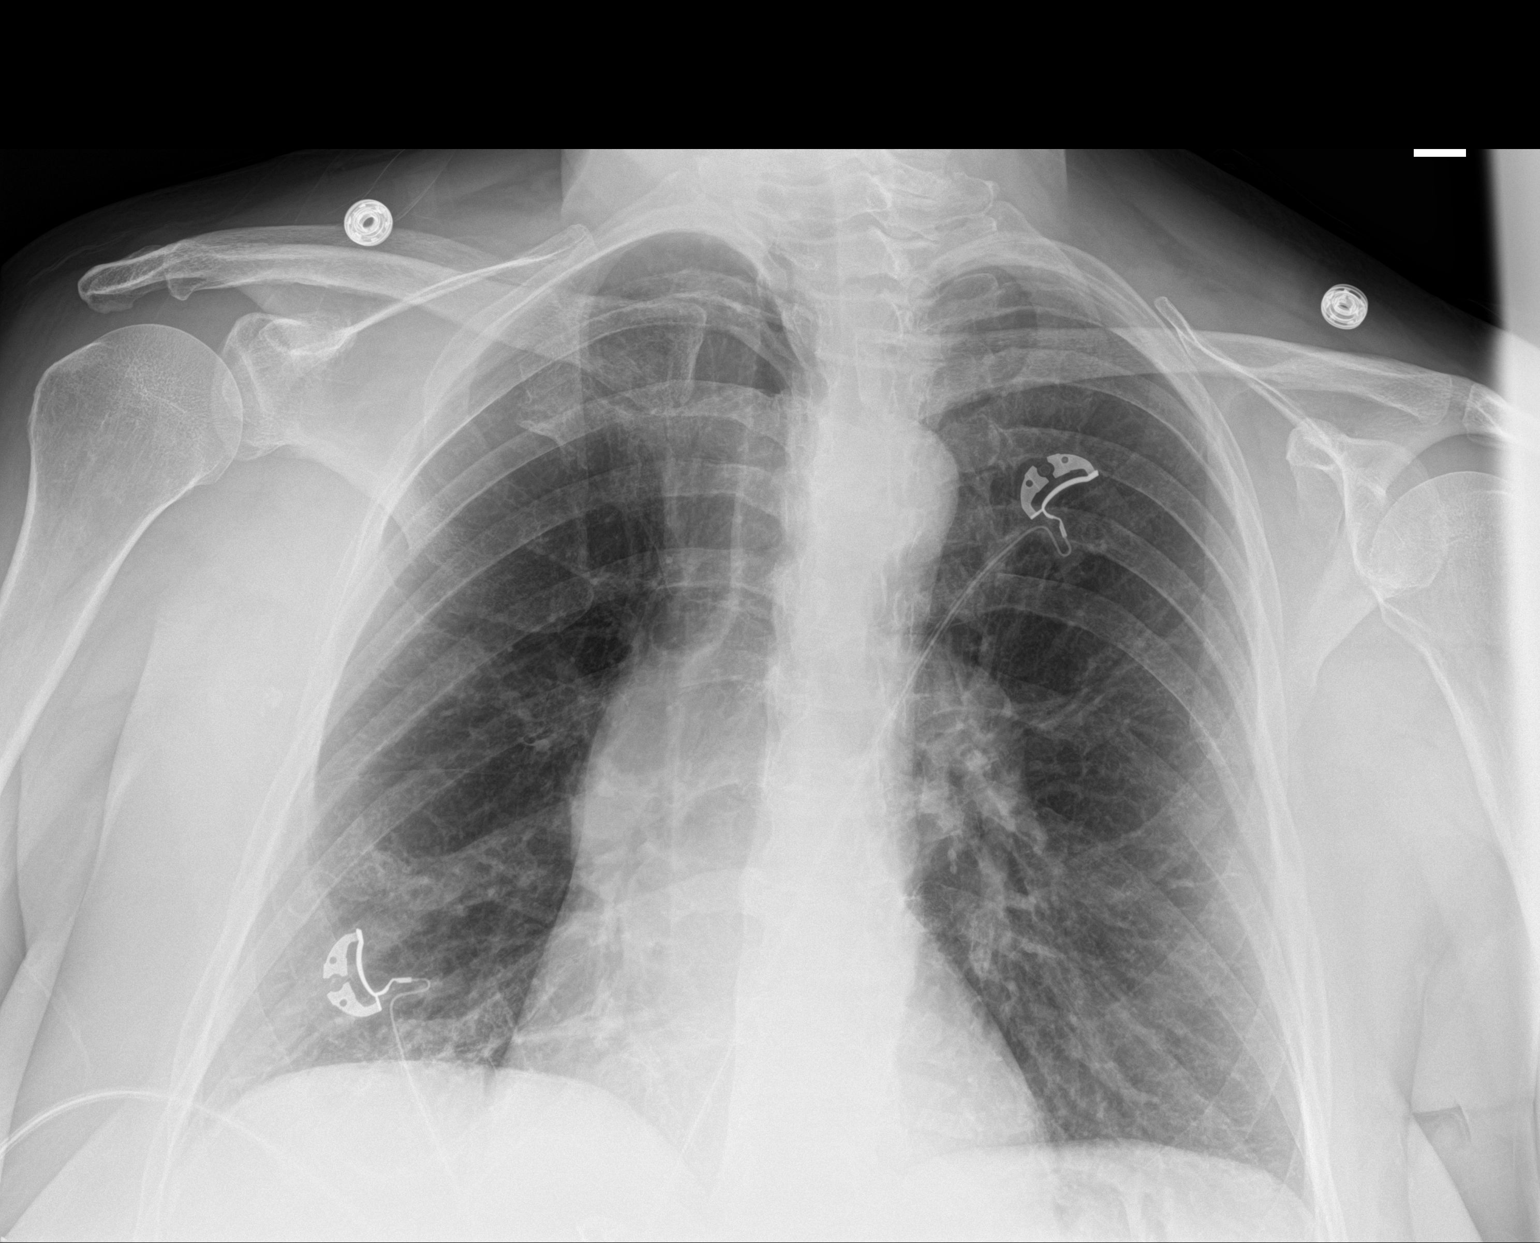

[1 of 1 positions shown; findings below may reference images not displayed]

FINDINGS: The lungs remain hyperinflated. There is increased density just
above the right hemidiaphragm on today's study. The left lung is
clear. The heart and pulmonary vascularity are normal. The
mediastinum is normal in width. The trachea is midline. The bony
thorax is unremarkable.
IMPRESSION: Probable subsegmental atelectasis or developing pneumonia at the
right lung base superimposed upon COPD. No CHF.

## 2019-08-03 ENCOUNTER — Emergency Department (HOSPITAL_COMMUNITY): Payer: Medicaid Other

## 2019-08-03 ENCOUNTER — Encounter (HOSPITAL_COMMUNITY): Payer: Self-pay | Admitting: Emergency Medicine

## 2019-08-03 ENCOUNTER — Other Ambulatory Visit: Payer: Self-pay

## 2019-08-03 ENCOUNTER — Inpatient Hospital Stay (HOSPITAL_COMMUNITY)
Admission: EM | Admit: 2019-08-03 | Discharge: 2019-08-06 | DRG: 378 | Disposition: A | Discharge status: Home / Self Care | Payer: Medicaid Other | Attending: Internal Medicine | Admitting: Internal Medicine

## 2019-08-03 DIAGNOSIS — F329 Major depressive disorder, single episode, unspecified: Secondary | ICD-10-CM | POA: Diagnosis present

## 2019-08-03 DIAGNOSIS — R599 Enlarged lymph nodes, unspecified: Secondary | ICD-10-CM | POA: Diagnosis present

## 2019-08-03 DIAGNOSIS — Z6832 Body mass index (BMI) 32.0-32.9, adult: Secondary | ICD-10-CM

## 2019-08-03 DIAGNOSIS — I10 Essential (primary) hypertension: Secondary | ICD-10-CM | POA: Diagnosis present

## 2019-08-03 DIAGNOSIS — R6510 Systemic inflammatory response syndrome (SIRS) of non-infectious origin without acute organ dysfunction: Secondary | ICD-10-CM | POA: Diagnosis present

## 2019-08-03 DIAGNOSIS — Z9049 Acquired absence of other specified parts of digestive tract: Secondary | ICD-10-CM

## 2019-08-03 DIAGNOSIS — D62 Acute posthemorrhagic anemia: Secondary | ICD-10-CM | POA: Diagnosis present

## 2019-08-03 DIAGNOSIS — F17210 Nicotine dependence, cigarettes, uncomplicated: Secondary | ICD-10-CM | POA: Diagnosis present

## 2019-08-03 DIAGNOSIS — E669 Obesity, unspecified: Secondary | ICD-10-CM | POA: Diagnosis present

## 2019-08-03 DIAGNOSIS — Z7901 Long term (current) use of anticoagulants: Secondary | ICD-10-CM

## 2019-08-03 DIAGNOSIS — T39395A Adverse effect of other nonsteroidal anti-inflammatory drugs [NSAID], initial encounter: Secondary | ICD-10-CM | POA: Diagnosis present

## 2019-08-03 DIAGNOSIS — N179 Acute kidney failure, unspecified: Secondary | ICD-10-CM | POA: Diagnosis present

## 2019-08-03 DIAGNOSIS — Z716 Tobacco abuse counseling: Secondary | ICD-10-CM

## 2019-08-03 DIAGNOSIS — R296 Repeated falls: Secondary | ICD-10-CM | POA: Diagnosis present

## 2019-08-03 DIAGNOSIS — J449 Chronic obstructive pulmonary disease, unspecified: Secondary | ICD-10-CM | POA: Diagnosis present

## 2019-08-03 DIAGNOSIS — K529 Noninfective gastroenteritis and colitis, unspecified: Secondary | ICD-10-CM | POA: Diagnosis present

## 2019-08-03 DIAGNOSIS — Z79899 Other long term (current) drug therapy: Secondary | ICD-10-CM

## 2019-08-03 DIAGNOSIS — Z20822 Contact with and (suspected) exposure to covid-19: Secondary | ICD-10-CM | POA: Diagnosis present

## 2019-08-03 DIAGNOSIS — R079 Chest pain, unspecified: Secondary | ICD-10-CM

## 2019-08-03 DIAGNOSIS — K922 Gastrointestinal hemorrhage, unspecified: Secondary | ICD-10-CM | POA: Diagnosis present

## 2019-08-03 DIAGNOSIS — I248 Other forms of acute ischemic heart disease: Secondary | ICD-10-CM | POA: Diagnosis present

## 2019-08-03 DIAGNOSIS — K921 Melena: Secondary | ICD-10-CM | POA: Diagnosis not present

## 2019-08-03 DIAGNOSIS — E861 Hypovolemia: Secondary | ICD-10-CM | POA: Diagnosis present

## 2019-08-03 DIAGNOSIS — I2510 Atherosclerotic heart disease of native coronary artery without angina pectoris: Secondary | ICD-10-CM | POA: Diagnosis present

## 2019-08-03 DIAGNOSIS — Z8249 Family history of ischemic heart disease and other diseases of the circulatory system: Secondary | ICD-10-CM | POA: Diagnosis not present

## 2019-08-03 DIAGNOSIS — Z8711 Personal history of peptic ulcer disease: Secondary | ICD-10-CM

## 2019-08-03 DIAGNOSIS — R778 Other specified abnormalities of plasma proteins: Secondary | ICD-10-CM

## 2019-08-03 DIAGNOSIS — E876 Hypokalemia: Secondary | ICD-10-CM | POA: Diagnosis present

## 2019-08-03 DIAGNOSIS — Z888 Allergy status to other drugs, medicaments and biological substances status: Secondary | ICD-10-CM

## 2019-08-03 DIAGNOSIS — Z7289 Other problems related to lifestyle: Secondary | ICD-10-CM

## 2019-08-03 DIAGNOSIS — Z7951 Long term (current) use of inhaled steroids: Secondary | ICD-10-CM

## 2019-08-03 DIAGNOSIS — K3189 Other diseases of stomach and duodenum: Secondary | ICD-10-CM | POA: Diagnosis present

## 2019-08-03 DIAGNOSIS — Z7982 Long term (current) use of aspirin: Secondary | ICD-10-CM

## 2019-08-03 DIAGNOSIS — R9431 Abnormal electrocardiogram [ECG] [EKG]: Secondary | ICD-10-CM | POA: Diagnosis not present

## 2019-08-03 DIAGNOSIS — I214 Non-ST elevation (NSTEMI) myocardial infarction: Secondary | ICD-10-CM

## 2019-08-03 DIAGNOSIS — M5430 Sciatica, unspecified side: Secondary | ICD-10-CM | POA: Diagnosis present

## 2019-08-03 DIAGNOSIS — I252 Old myocardial infarction: Secondary | ICD-10-CM | POA: Diagnosis not present

## 2019-08-03 DIAGNOSIS — Z833 Family history of diabetes mellitus: Secondary | ICD-10-CM

## 2019-08-03 DIAGNOSIS — K254 Chronic or unspecified gastric ulcer with hemorrhage: Principal | ICD-10-CM | POA: Diagnosis present

## 2019-08-03 DIAGNOSIS — Z90710 Acquired absence of both cervix and uterus: Secondary | ICD-10-CM | POA: Diagnosis not present

## 2019-08-03 DIAGNOSIS — K259 Gastric ulcer, unspecified as acute or chronic, without hemorrhage or perforation: Secondary | ICD-10-CM | POA: Diagnosis not present

## 2019-08-03 DIAGNOSIS — Z7902 Long term (current) use of antithrombotics/antiplatelets: Secondary | ICD-10-CM

## 2019-08-03 DIAGNOSIS — Z79891 Long term (current) use of opiate analgesic: Secondary | ICD-10-CM

## 2019-08-03 DIAGNOSIS — Z880 Allergy status to penicillin: Secondary | ICD-10-CM

## 2019-08-03 HISTORY — DX: Gastrointestinal hemorrhage, unspecified: K92.2

## 2019-08-03 LAB — CBC
HCT: 30.8 % — ABNORMAL LOW (ref 36.0–46.0)
Hemoglobin: 10.3 g/dL — ABNORMAL LOW (ref 12.0–15.0)
MCH: 31.2 pg (ref 26.0–34.0)
MCHC: 33.4 g/dL (ref 30.0–36.0)
MCV: 93.3 fL (ref 80.0–100.0)
Platelets: 249 10*3/uL (ref 150–400)
RBC: 3.3 MIL/uL — ABNORMAL LOW (ref 3.87–5.11)
RDW: 16.1 % — ABNORMAL HIGH (ref 11.5–15.5)
WBC: 21 10*3/uL — ABNORMAL HIGH (ref 4.0–10.5)
nRBC: 0.3 % — ABNORMAL HIGH (ref 0.0–0.2)

## 2019-08-03 LAB — COMPREHENSIVE METABOLIC PANEL
ALT: 16 U/L (ref 0–44)
AST: 20 U/L (ref 15–41)
Albumin: 3.2 g/dL — ABNORMAL LOW (ref 3.5–5.0)
Alkaline Phosphatase: 51 U/L (ref 38–126)
Anion gap: 15 (ref 5–15)
BUN: 58 mg/dL — ABNORMAL HIGH (ref 6–20)
CO2: 32 mmol/L (ref 22–32)
Calcium: 8.9 mg/dL (ref 8.9–10.3)
Chloride: 94 mmol/L — ABNORMAL LOW (ref 98–111)
Creatinine, Ser: 1.55 mg/dL — ABNORMAL HIGH (ref 0.44–1.00)
GFR calc Af Amer: 44 mL/min — ABNORMAL LOW (ref >60)
GFR calc non Af Amer: 38 mL/min — ABNORMAL LOW (ref >60)
Glucose, Bld: 148 mg/dL — ABNORMAL HIGH (ref 70–99)
Potassium: 2.1 mmol/L — CL (ref 3.5–5.1)
Sodium: 141 mmol/L (ref 135–145)
Total Bilirubin: 0.9 mg/dL (ref 0.3–1.2)
Total Protein: 5.5 g/dL — ABNORMAL LOW (ref 6.5–8.1)

## 2019-08-03 LAB — URINALYSIS, ROUTINE W REFLEX MICROSCOPIC
Bilirubin Urine: NEGATIVE
Glucose, UA: NEGATIVE mg/dL
Ketones, ur: NEGATIVE mg/dL
Leukocytes,Ua: NEGATIVE
Nitrite: NEGATIVE
Protein, ur: NEGATIVE mg/dL
Specific Gravity, Urine: 1.02 (ref 1.005–1.030)
pH: 6 (ref 5.0–8.0)

## 2019-08-03 LAB — ABO/RH: ABO/RH(D): O POS

## 2019-08-03 LAB — RESPIRATORY PANEL BY RT PCR (FLU A&B, COVID)
Influenza A by PCR: NEGATIVE
Influenza B by PCR: NEGATIVE
SARS Coronavirus 2 by RT PCR: NEGATIVE

## 2019-08-03 LAB — LACTIC ACID, PLASMA
Lactic Acid, Venous: 2.8 mmol/L (ref 0.5–1.9)
Lactic Acid, Venous: 2.2 mmol/L (ref 0.5–1.9)

## 2019-08-03 LAB — MAGNESIUM: Magnesium: 3.6 mg/dL — ABNORMAL HIGH (ref 1.7–2.4)

## 2019-08-03 LAB — PROTIME-INR
INR: 0.9 (ref 0.8–1.2)
Prothrombin Time: 12.5 seconds (ref 11.4–15.2)

## 2019-08-03 LAB — I-STAT BETA HCG BLOOD, ED (MC, WL, AP ONLY): I-stat hCG, quantitative: <5 m[IU]/mL (ref <5)

## 2019-08-03 LAB — APTT: aPTT: 21 seconds — ABNORMAL LOW (ref 24–36)

## 2019-08-03 LAB — TROPONIN I (HIGH SENSITIVITY): Troponin I (High Sensitivity): 155 ng/L (ref <18)

## 2019-08-03 LAB — POC OCCULT BLOOD, ED: Fecal Occult Bld: POSITIVE — AB

## 2019-08-03 MED ORDER — SODIUM CHLORIDE 0.9 % IV BOLUS
1000.0000 mL | Freq: Once | INTRAVENOUS | Status: AC
  Administered 2019-08-03: 1000 mL via INTRAVENOUS

## 2019-08-03 MED ORDER — METRONIDAZOLE IN NACL 5-0.79 MG/ML-% IV SOLN
500.0000 mg | Freq: Once | INTRAVENOUS | Status: AC
  Administered 2019-08-03: 500 mg via INTRAVENOUS
  Filled 2019-08-03: qty 100

## 2019-08-03 MED ORDER — POTASSIUM CHLORIDE CRYS ER 20 MEQ PO TBCR
20.0000 meq | EXTENDED_RELEASE_TABLET | Freq: Three times a day (TID) | ORAL | Status: DC
  Administered 2019-08-04: 20 meq via ORAL
  Filled 2019-08-03: qty 1

## 2019-08-03 MED ORDER — SODIUM CHLORIDE 0.9 % IV SOLN
8.0000 mg/h | INTRAVENOUS | Status: DC
  Administered 2019-08-03 – 2019-08-04 (×2): 8 mg/h via INTRAVENOUS
  Filled 2019-08-03 (×3): qty 80

## 2019-08-03 MED ORDER — IOHEXOL 350 MG/ML SOLN
80.0000 mL | Freq: Once | INTRAVENOUS | Status: AC | PRN
  Administered 2019-08-03: 80 mL via INTRAVENOUS

## 2019-08-03 MED ORDER — SODIUM CHLORIDE 0.9 % IV SOLN: INTRAVENOUS | Status: DC

## 2019-08-03 MED ORDER — LEVOFLOXACIN IN D5W 750 MG/150ML IV SOLN: 750.0000 mg | Freq: Once | INTRAVENOUS | Status: DC

## 2019-08-03 MED ORDER — PANTOPRAZOLE SODIUM 40 MG IV SOLR: 40.0000 mg | Freq: Once | INTRAVENOUS | Status: DC

## 2019-08-03 MED ORDER — SODIUM CHLORIDE 0.9 % IV SOLN
2.0000 g | INTRAVENOUS | Status: DC
  Administered 2019-08-03: 2 g via INTRAVENOUS
  Filled 2019-08-03: qty 20

## 2019-08-03 MED ORDER — POTASSIUM CHLORIDE 10 MEQ/100ML IV SOLN
10.0000 meq | INTRAVENOUS | Status: AC
  Administered 2019-08-03 (×5): 10 meq via INTRAVENOUS
  Filled 2019-08-03 (×5): qty 100

## 2019-08-03 MED ORDER — SODIUM CHLORIDE 0.9 % IV SOLN
80.0000 mg | Freq: Once | INTRAVENOUS | Status: AC
  Administered 2019-08-03: 80 mg via INTRAVENOUS
  Filled 2019-08-03: qty 80

## 2019-08-03 NOTE — Consult Note (Signed)
Cardiology Consult    Patient ID: Bethany Stuart MRN: [id number], DOB/AGE: 1966/05/23   Admit date: 08/03/2019 Date of Consult: 08/04/2019  Primary Physician: Eloisa Northern, MD Primary Cardiologist: Thomasene Ripple, DO Requesting Provider: Odie Sera, MD  Patient Profile    Bethany Stuart is a 54 y.o. female with a history of COPD, non-obstructive CAD (40-60% RCA), 50 pack-year smoking history, hypertension, possible LVOT obstruction, and arthritis/DDD on chronic opiods with frequent falls due to sciatica who presented with abdominal pain and melena. She is being seen today for the evaluation of chest pain and an elevated troponin.   History of Present Illness    Presented for abdominal pain with frequent melenic stools for the past 3 days, initially with nausea and vomiting as well. She is dizzy now with standing. As this has progressed she has noted chest pain that is both pleuritic and exertional. This worse that she has experienced in the past. She has chronic dyspnea related to her COPD and reports increased cough and wheezing recently as well.   On arrival she was mildly tachycardic and borderline febrile, but normotensive (typically struggles with hypertension). ECG showed diffuse ST-depression and mild reciprocal changes in aVR, as observed on multiple prior ECGs (see below). Troponin 155 -> 182. Other work-up was significant for Hgb 10.3 from 12.4 previously, WBC 21, lactate 2.8, K 2.1, Cr 1.55 (normal baseline), and on admission ended up testing positive for C. Diff. PE and COVID ruled out.   Notably, was also hospitalized 06/2018 with right-sided chest pain associated with very similar ECG changes and a mild troponin elevation. Coronary angiography at that time showed non-obstructive CAD and she was treated for a COPD exacerbation. She had the same ECG changes associated with chest pain again during a recent admission a couple of months ago at [facility] for septic shock due to a  UTI after presenting for syncope. This was felt to show a possible LVOT gradient of 196. She followed up with Dr. Servando Salina last month, complaining of worsening chest pain at that time. Repeat angiography on 06/29/19 with Dr. Katrinka Blazing showed no change from her prior cath and no intracavitary gradient was found.    Past Medical History   Past Medical History:  Diagnosis Date  . Arthritis    Back  . Bulging lumbar disc   . COPD (chronic obstructive pulmonary disease) (HCC)   . Depression   . Hypertension   . Sciatica     Past Surgical History:  Procedure Laterality Date  . ABDOMINAL HYSTERECTOMY    . APPENDECTOMY    . CHOLECYSTECTOMY    . FOOT SURGERY    . INTRAVASCULAR PRESSURE WIRE/FFR STUDY N/A 06/15/2018   Procedure: INTRAVASCULAR PRESSURE WIRE/FFR STUDY;  Surgeon: Elder Negus, MD;  Location: [facility];  Service: Cardiovascular;  Laterality: N/A;  . KNEE SURGERY Right   . LEFT HEART CATH AND CORONARY ANGIOGRAPHY N/A 06/15/2018   Procedure: LEFT HEART CATH AND CORONARY ANGIOGRAPHY;  Surgeon: Elder Negus, MD;  Location: [facility];  Service: Cardiovascular;  Laterality: N/A;  . LEFT HEART CATH AND CORONARY ANGIOGRAPHY N/A 06/29/2019   Procedure: LEFT HEART CATH AND CORONARY ANGIOGRAPHY;  Surgeon: Lyn Records, MD;  Location: [facility];  Service: Cardiovascular;  Laterality: N/A;     Allergies  Allergen Reactions  . Fioricet [Butalbital-Apap-Caffeine] Other (See Comments)    "crazy in my head"  . Klonopin [Clonazepam] Other (See Comments)    "crazy in  my head"  . Penicillins Rash    Has patient had a PCN reaction causing immediate rash, facial/tongue/throat swelling, SOB or lightheadedness with hypotension: Yes Has patient had a PCN reaction causing severe rash involving mucus membranes or skin necrosis: No Has patient had a PCN reaction that required hospitalization: No Has patient had a PCN reaction occurring within the last 10 years:  No If all of the above answers are "NO", then may proceed with Cephalosporin use.    Inpatient Medications    . sodium chloride   Intravenous Once  . atorvastatin  40 mg Oral Daily  . fluticasone furoate-vilanterol  1 puff Inhalation Daily   And  . umeclidinium bromide  1 puff Inhalation Daily  . potassium chloride  20 mEq Oral TID  . sodium chloride flush  3 mL Intravenous Q12H  . traZODone  100 mg Oral QHS    Family History    Family History  Problem Relation Age of Onset  . Pneumonia Mother   . CAD Mother        CABG  . Hypertension Mother   . Emphysema Father   . Fibromyalgia Sister   . Heart attack Brother   . Diabetes Maternal Grandmother   . Heart attack Maternal Grandfather   . Fibromyalgia Sister    She indicated that her mother is deceased. She indicated that her father is deceased. She indicated that both of her sisters are alive. She indicated that her brother is deceased. She indicated that her maternal grandmother is deceased. She indicated that her maternal grandfather is deceased.   Social History    Social History   Socioeconomic History  . Marital status: Single    Spouse name: Not on file  . Number of children: Not on file  . Years of education: Not on file  . Highest education level: Not on file  Occupational History  . Not on file  Tobacco Use  . Smoking status: Current Every Day Smoker  . Smokeless tobacco: Never Used  Substance and Sexual Activity  . Alcohol use: Yes  . Drug use: No  . Sexual activity: Not on file  Other Topics Concern  . Not on file  Social History Narrative  . Not on file   Social Determinants of Health   Financial Resource Strain:   . Difficulty of Paying Living Expenses: Not on file  Food Insecurity:   . Worried About Charity fundraiser in the Last Year: Not on file  . Ran Out of Food in the Last Year: Not on file  Transportation Needs:   . Lack of Transportation (Medical): Not on file  . Lack of  Transportation (Non-Medical): Not on file  Physical Activity:   . Days of Exercise per Week: Not on file  . Minutes of Exercise per Session: Not on file  Stress:   . Feeling of Stress : Not on file  Social Connections:   . Frequency of Communication with Friends and Family: Not on file  . Frequency of Social Gatherings with Friends and Family: Not on file  . Attends Religious Services: Not on file  . Active Member of Clubs or Organizations: Not on file  . Attends Archivist Meetings: Not on file  . Marital Status: Not on file  Intimate Partner Violence:   . Fear of Current or Ex-Partner: Not on file  . Emotionally Abused: Not on file  . Physically Abused: Not on file  . Sexually Abused: Not on file  Review of Systems    A comprehensive review of 10 systems was performed with pertinent positives and negatives noted in the HPI.  Physical Exam    Blood pressure 133/82, pulse 93, temperature 99.4 F (37.4 C), temperature source Oral, resp. rate (!) 24, height 5\' 7"  (1.702 m), weight 88.9 kg, SpO2 100 %.     Intake/Output Summary (Last 24 hours) at 08/04/2019 0346 Last data filed at 08/04/2019 0018 Gross per 24 hour  Intake 2700 ml  Output --  Net 2700 ml   Wt Readings from Last 3 Encounters:  08/03/19 88.9 kg  07/08/19 88.9 kg  06/29/19 88.9 kg    CONSTITUTIONAL: uncomfortable appearing, sitting up on side of bed HEENT: dry mucous membrantes, conjunctiva normal, EOM intact, pupils equal NECK: no thyromegaly, no JVD CARDIOVASCULAR: Regular rhythm. 2/6 systolic murmur at LUSB with slight respiratory variation. No gallop or rub. Normal S1/S2. Radial pulses intact. No carotid bruits. PULMONARY/CHEST WALL: no deformities, prominent and diffuse expiratory>inspiratory wheezing, slightly tachypneic, but in no respiratory distress ABDOMINAL: soft, mild tenderness, non-distended EXTREMITIES: no edema or muscle atrophy, warm and well-perfused SKIN: Dry and intact without  apparent rashes or wounds. NEUROLOGIC: alert, normal gait, no abnormal movements, cranial nerves grossly intact.   Labs    Troponin (Point of Care Test) No results for input(s): TROPIPOC in the last 72 hours. No results for input(s): CKTOTAL, CKMB, TROPONINI in the last 72 hours. Lab Results  Component Value Date   WBC 21.0 (H) 08/03/2019   HGB 7.3 (L) 08/03/2019   HCT 22.6 (L) 08/03/2019   MCV 93.3 08/03/2019   PLT 249 08/03/2019    Recent Labs  Lab 08/03/19 1725  NA 141  K 2.1*  CL 94*  CO2 32  BUN 58*  CREATININE 1.55*  CALCIUM 8.9  PROT 5.5*  BILITOT 0.9  ALKPHOS 51  ALT 16  AST 20  GLUCOSE 148*   Lab Results  Component Value Date   CHOL 165 06/14/2018   HDL 64 06/14/2018   LDLCALC 63 06/14/2018   TRIG 191 (H) 06/14/2018   Lab Results  Component Value Date   DDIMER 0.52 (H) 06/14/2018     Radiology Studies    CT Angio Chest PE W/Cm &/Or Wo Cm  Result Date: 08/03/2019 CLINICAL DATA:  Shortness of breath. EXAM: CT ANGIOGRAPHY CHEST WITH CONTRAST TECHNIQUE: Multidetector CT imaging of the chest was performed using the standard protocol during bolus administration of intravenous contrast. Multiplanar CT image reconstructions and MIPs were obtained to evaluate the vascular anatomy. CONTRAST:  53mL OMNIPAQUE IOHEXOL 350 MG/ML SOLN COMPARISON:  CT angiogram chest June 14, 2018 FINDINGS: Cardiovascular: There is no demonstrable pulmonary embolus. There is no thoracic aortic aneurysm or dissection. Visualized great vessels appear unremarkable. There are foci of aortic atherosclerosis. There are foci coronary artery calcification. There is no appreciable pericardial effusion or pericardial thickening. Mediastinum/Nodes: Visualized thyroid appears normal. There are scattered subcentimeter mediastinal lymph nodes. There is no adenopathy by size criteria. No esophageal lesions are evident. Lungs/Pleura: There is underlying centrilobular emphysematous change. No edema or  airspace opacity. No appreciable pleural effusions. Upper Abdomen: There is left adrenal hypertrophy, a finding also present previously. Visualized upper abdominal structures otherwise appear unremarkable. Musculoskeletal: There are no blastic or lytic bone lesions. No evident chest wall lesions. Review of the MIP images confirms the above findings. IMPRESSION: 1. No demonstrable pulmonary embolus. No thoracic aortic aneurysm or dissection. There is aortic atherosclerosis as well as foci of coronary artery calcification.  2. Underlying centrilobular emphysema. No edema or airspace opacity. 3.  No evident adenopathy. 4. Left adrenal hypertrophy, a finding of uncertain etiology or significance. Aortic Atherosclerosis (ICD10-I70.0) and Emphysema (ICD10-J43.9). Electronically Signed   By: Bretta Bang III M.D.   On: 08/03/2019 19:43   CT ABDOMEN PELVIS W CONTRAST  Result Date: 08/03/2019 CLINICAL DATA:  Abdominal pain and black tarry stools. EXAM: CT ABDOMEN AND PELVIS WITH CONTRAST TECHNIQUE: Multidetector CT imaging of the abdomen and pelvis was performed using the standard protocol following bolus administration of intravenous contrast. CONTRAST:  72mL OMNIPAQUE IOHEXOL 350 MG/ML SOLN COMPARISON:  06/03/2018 FINDINGS: Lower chest: The lung bases are clear of acute process. No pleural effusion or pulmonary lesions. The heart is normal in size. No pericardial effusion. The distal esophagus and aorta are unremarkable. Hepatobiliary: No focal hepatic lesions or intrahepatic biliary dilatation. The gallbladder is surgically absent. No common bile duct dilatation. Pancreas: No mass, inflammation or ductal dilatation. Spleen: Normal size. No focal lesions. Adrenals/Urinary Tract: Stable left adrenal gland nodules, likely benign adenomas. The right adrenal gland is unremarkable. Both kidneys are unremarkable. Stomach/Bowel: The stomach, duodenum, small bowel and colon are unremarkable. No acute inflammatory changes,  mass lesions or obstructive findings. The terminal ileum appears normal. The appendix is surgically absent. There is a moderate amount of fluid throughout the colon with scattered air-fluid levels typically seen with diarrhea. I do not see any wall thickening or pericolonic inflammatory changes. Vascular/Lymphatic: Moderate atherosclerotic calcifications involving the aorta and iliac arteries but no aneurysm or dissection. The branch vessels are patent. The major venous structures are patent. No mesenteric or retroperitoneal mass or adenopathy. Reproductive: Surgically absent. Other: No pelvic mass or adenopathy. No free pelvic fluid collections. No inguinal mass or adenopathy. No abdominal wall hernia or subcutaneous lesions. Musculoskeletal: Scoliosis and degenerative lumbar spondylosis with multilevel disc disease and facet disease. No acute bony findings or bone lesions. IMPRESSION: 1. Moderate fluid and scattered air-fluid levels throughout the colon typically seen with diarrhea. No colonic wall thickening or pericolonic inflammatory changes. 2. No other acute abdominal/pelvic findings and no mass lesions or adenopathy. 3. Status post cholecystectomy. No biliary dilatation. 4. Stable left adrenal gland nodules, likely benign adenomas. Electronically Signed   By: Rudie Meyer M.D.   On: 08/03/2019 19:43   DG Chest Port 1 View  Result Date: 08/03/2019 CLINICAL DATA:  54 year old female right side chest pain. Bloody stools for 3 days. EXAM: PORTABLE CHEST 1 VIEW COMPARISON:  [facility] portable chest 09/03/2018 and earlier. FINDINGS: Portable AP upright view at 1703 hours. Attenuated bronchovascular markings in the lungs in keeping with emphysema demonstrated by CT on 12/01/2018. Stable lung volumes. Mediastinal contours are stable with some central pulmonary artery enlargement. Visualized tracheal air column is within normal limits. Allowing for portable technique the lungs are clear. No acute osseous  abnormality identified. IMPRESSION: 1.  No acute cardiopulmonary abnormality. 2. Emphysema (ICD10-J43.9). Chronic central pulmonary artery enlargement raising the possibility of pulmonary artery hypertension. Electronically Signed   By: Odessa Fleming M.D.   On: 08/03/2019 17:22    ECG & Cardiac Imaging    ECG shows sinus rhythm, diffuse ST depression, borderline ST elevation in aVR. Similar to prior ECGs during acute illness  - personally reviewed.  Prior echocardiography and catheterization results were personally reviewed.  Assessment & Plan    Lacrecia Delval is a 55 y.o. female with a history of COPD, non-obstructive CAD (40-60% RCA), 50 pack-year smoking history, hypertension, possible LVOT obstruction, and  arthritis/DDD on chronic opiods with frequent falls due to sciatica who presented with abdominal pain and melena, found to have C. Diff infection associated with sepsis. She is being seen today for the evaluation of chest pain and an elevated troponin.   While her chest pain is anginal, she has known intermittent LVOT obstruction that tends to be seen in the setting of hypovolemia and we now have observed similar ischemic ECG changes and chest pain previously with her in the setting of sepsis with stable non-obstructive coronary angiography now on 2 occasions, most recently just 1 month ago. This is very likely again type 2 myocardial injury/ischemia secondary to C. Diff infection with hypovolemia and sepsis, acute blood loss anemia, and likely further exacerbated by LVOT obstruction in the setting of hypovolemia and tachycardia - this is likely the cause of her angina. At this point, ACS seems to be unlikely and yet another catheterization seems particularly unwise in the setting of acute GI bleed.   - Volume resuscitation and treatment of GI bleed, sepsis/CDI, and other contributing factors - this should also improve/resolve any LVOT/intracavitary obstruction - Do not given nitroglycerin for chest  pain - Echocardiogram tomorrow, had been scheduled outpatient - Continue to trend troponin - expect this to improve with volume resuscitation - Once stable, restart beta blocker and diltiazem - It may be reasonable to restart aspirin given her known CAD pending work-up/treatment of melena, however, Plavix can likely be discontinued at this point due to GI bleed. Continue atorvastatin.    Signed, Clerance LavScott W Shley Dolby, MD 08/04/2019, 3:46 AM  For questions or updates, please contact   Please consult [email or web address] for contact info under Cardiology/STEMI.

## 2019-08-03 NOTE — ED Notes (Addendum)
Date and time results received: 08/03/19 6:36 PM  Test: lactic acid Critical Value: 2.8  Name of Provider Notified: yes  Orders Received? Or Actions Taken?: Orders Received - See Orders for details

## 2019-08-03 NOTE — ED Notes (Signed)
RN unable to obtain labs. Will ask phlebotomy to draw

## 2019-08-03 NOTE — H&P (Signed)
History and Physical    Bethany Stuart HYQ:[id number] DOB: 05-24-1966 DOA: 08/03/2019  PCP: Bethany Brothers, MD   Patient coming from: Home   Chief Complaint: Chest pain, abdominal pain, dark loose stools   HPI: Bethany Stuart is a 54 y.o. female with medical history significant for COPD, hypertension, depression, and coronary artery disease, now presenting to the emergency department with abdominal pain, chest pain, and dark, tarry, loose stools.  Patient reports that she had been doing fairly well at home since her recent hospitalization until 3 days ago when she developed pain in the mid abdomen with recurrent dark and loose stools.  She has also developed chest pain, localized to the central chest, not reproducible, worse with activity, but fairly constant.  She denies any fevers or chills, reports that her chronic cough has been stable, and her chronic dyspnea has also been stable.  ED Course: Upon arrival to the ED, patient is found to be afebrile, saturating well on room air, mildly tachypneic, tachycardic in the low 100s, and with stable blood pressure.  EKG features sinus rhythm with ST depressions and QTc interval 527 milliseconds.  Chest x-ray notable for emphysematous changes and chronic central pulmonary artery enlargement, but no acute findings.  CTA chest is negative for PE.  CT the abdomen and pelvis notable for fluid and air-fluid levels throughout the colon.  Chemistry panel with potassium 2.1, BUN 58, and creatinine 1.55, up from 0.82 last month.  Fecal occult blood testing is positive.  COVID-19 PCR is negative.  CBC with leukocytosis to 21,000 and hemoglobin 10.3, down from 12.4 last month.  Lactic acid is 2.8 and troponin I 155.  Type and screen was performed, blood and urine cultures collected, Rocephin and Flagyl administered, patient also given 2 L normal saline, IV potassium, and IV Protonix.  Gastroenterology and cardiology were consulted by the ED physician.  Review of Systems:  All  other systems reviewed and apart from HPI, are negative.  Past Medical History:  Diagnosis Date  . Arthritis    Back  . Bulging lumbar disc   . COPD (chronic obstructive pulmonary disease) ([place])   . Depression   . Hypertension   . Sciatica     Past Surgical History:  Procedure Laterality Date  . ABDOMINAL HYSTERECTOMY    . APPENDECTOMY    . CHOLECYSTECTOMY    . FOOT SURGERY    . INTRAVASCULAR PRESSURE WIRE/FFR STUDY N/A 06/15/2018   Procedure: INTRAVASCULAR PRESSURE WIRE/FFR STUDY;  Surgeon: Bethany Mormon, MD;  Location: [facility];  Service: Cardiovascular;  Laterality: N/A;  . KNEE SURGERY Right   . LEFT HEART CATH AND CORONARY ANGIOGRAPHY N/A 06/15/2018   Procedure: LEFT HEART CATH AND CORONARY ANGIOGRAPHY;  Surgeon: Bethany Mormon, MD;  Location: [facility];  Service: Cardiovascular;  Laterality: N/A;  . LEFT HEART CATH AND CORONARY ANGIOGRAPHY N/A 06/29/2019   Procedure: LEFT HEART CATH AND CORONARY ANGIOGRAPHY;  Surgeon: Bethany Crome, MD;  Location: [facility];  Service: Cardiovascular;  Laterality: N/A;     reports that she has been smoking. She has never used smokeless tobacco. She reports current alcohol use. She reports that she does not use drugs.  Allergies  Allergen Reactions  . Fioricet [Butalbital-Apap-Caffeine] Other (See Comments)    "crazy in my head"  . Klonopin [Clonazepam] Other (See Comments)    "crazy in my head"  . Penicillins Rash    Has patient had a PCN reaction causing immediate  rash, facial/tongue/throat swelling, SOB or lightheadedness with hypotension: Yes Has patient had a PCN reaction causing severe rash involving mucus membranes or skin necrosis: No Has patient had a PCN reaction that required hospitalization: No Has patient had a PCN reaction occurring within the last 10 years: No If all of the above answers are "NO", then may proceed with Cephalosporin use.     Family History  Problem Relation Age  of Onset  . Pneumonia Mother   . CAD Mother        CABG  . Hypertension Mother   . Emphysema Father   . Fibromyalgia Sister   . Heart attack Brother   . Diabetes Maternal Grandmother   . Heart attack Maternal Grandfather   . Fibromyalgia Sister      Prior to Admission medications   Medication Sig Start Date End Date Taking? Authorizing Provider  ALPRAZolam Bethany Stuart) 0.5 MG tablet Take 0.5 mg by mouth daily as needed for anxiety.   Yes [provider]  aspirin EC 81 MG EC tablet Take 1 tablet (81 mg total) by mouth daily. 06/17/18  Yes Bethany Ren, MD  atorvastatin (LIPITOR) 40 MG tablet Take 40 mg by mouth daily.   Yes [provider]  carvedilol (COREG) 25 MG tablet Take 1 tablet (25 mg total) by mouth 2 (two) times daily. 07/08/19 10/06/19 Yes Tobb, Kardie, DO  clopidogrel (PLAVIX) 75 MG tablet Take 1 tablet (75 mg total) by mouth daily. Continue aspirin and Plavix together for 1 year Patient taking differently: Take 75 mg by mouth daily.  06/17/18  Yes Bethany Ren, MD  diltiazem (CARDIZEM CD) 120 MG 24 hr capsule Take 1 capsule (120 mg total) by mouth daily. 07/08/19 10/06/19 Yes Tobb, Kardie, DO  FLUoxetine (PROZAC) 20 MG tablet Take 20 mg by mouth daily.   Yes [provider]  Fluticasone-Umeclidin-Vilant (TRELEGY ELLIPTA) 100-62.5-25 MCG/INH AEPB Inhale 1 puff into the lungs daily.   Yes [provider]  hydrochlorothiazide (HYDRODIURIL) 25 MG tablet Take 1 tablet (25 mg total) by mouth daily. 07/08/19  Yes Tobb, Kardie, DO  ipratropium-albuterol (DUONEB) 0.5-2.5 (3) MG/3ML SOLN Take 3 mLs by nebulization every 4 (four) hours as needed (for shortness of breath).    Yes [provider]  traZODone (DESYREL) 100 MG tablet Take 100 mg by mouth at bedtime.   Yes [provider]    Physical Exam: Vitals:   08/03/19 2200 08/03/19 2215 08/03/19 2245 08/03/19 2300  BP: 137/74 134/71 119/68 122/72  Pulse: 86 84 85 82    Resp: 17 19 17 15   Temp:      TempSrc:      SpO2: 98% 97% 100% 100%  Weight:      Height:        Constitutional: NAD, calm  Eyes: PERTLA, lids and conjunctivae normal ENMT: Mucous membranes are moist. Posterior pharynx clear of any exudate or lesions.   Neck: normal, supple, no masses, no thyromegaly Respiratory: no wheezing, no crackles. No accessory muscle use.  Cardiovascular: rate ~110 and regular. No significant JVD. Abdomen: No distension, soft, mild tenderness in mid abdomen without rebound pain or guarding. Bowel sounds active.  Musculoskeletal: no clubbing / cyanosis. No joint deformity upper and lower extremities.   Skin: no significant rashes, lesions, ulcers. Warm, dry, well-perfused. Neurologic: No facial asymmetry. Sensation intact. Moving all extremities.  Psychiatric: Alert and appropriate throughout interview and exam. Pleasant and cooperative.    Labs and Imaging on Admission: I have personally  reviewed following labs and imaging studies  CBC: Recent Labs  Lab 08/03/19 1527  WBC 21.0*  HGB 10.3*  HCT 30.8*  MCV 93.3  PLT 249   Basic Metabolic Panel: Recent Labs  Lab 08/03/19 1721 08/03/19 1725  NA  --  141  K  --  2.1*  CL  --  94*  CO2  --  32  GLUCOSE  --  148*  BUN  --  58*  CREATININE  --  1.55*  CALCIUM  --  8.9  MG 3.6*  --    GFR: Estimated Creatinine Clearance: 47.5 mL/min (A) (by C-G formula based on SCr of 1.55 mg/dL (H)). Liver Function Tests: Recent Labs  Lab 08/03/19 1725  AST 20  ALT 16  ALKPHOS 51  BILITOT 0.9  PROT 5.5*  ALBUMIN 3.2*   No results for input(s): LIPASE, AMYLASE in the last 168 hours. No results for input(s): AMMONIA in the last 168 hours. Coagulation Profile: Recent Labs  Lab 08/03/19 1725  INR 0.9   Cardiac Enzymes: No results for input(s): CKTOTAL, CKMB, CKMBINDEX, TROPONINI in the last 168 hours. BNP (last 3 results) No results for input(s): PROBNP in the last 8760 hours. HbA1C: No results  for input(s): HGBA1C in the last 72 hours. CBG: No results for input(s): GLUCAP in the last 168 hours. Lipid Profile: No results for input(s): CHOL, HDL, LDLCALC, TRIG, CHOLHDL, LDLDIRECT in the last 72 hours. Thyroid Function Tests: No results for input(s): TSH, T4TOTAL, FREET4, T3FREE, THYROIDAB in the last 72 hours. Anemia Panel: No results for input(s): VITAMINB12, FOLATE, FERRITIN, TIBC, IRON, RETICCTPCT in the last 72 hours. Urine analysis:    Component Value Date/Time   COLORURINE YELLOW 08/03/2019 1645   APPEARANCEUR HAZY (A) 08/03/2019 1645   LABSPEC 1.020 08/03/2019 1645   PHURINE 6.0 08/03/2019 1645   GLUCOSEU NEGATIVE 08/03/2019 1645   HGBUR MODERATE (A) 08/03/2019 1645   BILIRUBINUR NEGATIVE 08/03/2019 1645   KETONESUR NEGATIVE 08/03/2019 1645   PROTEINUR NEGATIVE 08/03/2019 1645   NITRITE NEGATIVE 08/03/2019 1645   LEUKOCYTESUR NEGATIVE 08/03/2019 1645   Sepsis Labs: @LABRCNTIP (procalcitonin:4,lacticidven:4) ) Recent Results (from the past 240 hour(s))  Respiratory Panel by RT PCR (Flu A&B, Covid) - Urine, Clean Catch     Status: None   Collection Time: 08/03/19  5:02 PM   Specimen: Urine, Clean Catch  Result Value Ref Range Status   SARS Coronavirus 2 by RT PCR NEGATIVE NEGATIVE Final    Comment: (NOTE) SARS-CoV-2 target nucleic acids are NOT DETECTED. The SARS-CoV-2 RNA is generally detectable in upper respiratoy specimens during the acute phase of infection. The lowest concentration of SARS-CoV-2 viral copies this assay can detect is 131 copies/mL. A negative result does not preclude SARS-Cov-2 infection and should not be used as the sole basis for treatment or other patient management decisions. A negative result may occur with  improper specimen collection/handling, submission of specimen other than nasopharyngeal swab, presence of viral mutation(s) within the areas targeted by this assay, and inadequate number of viral copies (<131 copies/mL). A  negative result must be combined with clinical observations, patient history, and epidemiological information. The expected result is Negative. Fact Sheet for Patients:  08/05/19 Fact Sheet for Healthcare Providers:  https://www.moore.com/ This test is not yet ap proved or cleared by the https://www.young.biz/ FDA and  has been authorized for detection and/or diagnosis of SARS-CoV-2 by FDA under an Emergency Use Authorization (EUA). This EUA will remain  in effect (meaning this test can be  used) for the duration of the COVID-19 declaration under Section 564(b)(1) of the Act, 21 U.S.C. section 360bbb-3(b)(1), unless the authorization is terminated or revoked sooner.    Influenza A by PCR NEGATIVE NEGATIVE Final   Influenza B by PCR NEGATIVE NEGATIVE Final    Comment: (NOTE) The Xpert Xpress SARS-CoV-2/FLU/RSV assay is intended as an aid in  the diagnosis of influenza from Nasopharyngeal swab specimens and  should not be used as a sole basis for treatment. Nasal washings and  aspirates are unacceptable for Xpert Xpress SARS-CoV-2/FLU/RSV  testing. Fact Sheet for Patients: https://www.moore.com/https://www.fda.gov/media/142436/download Fact Sheet for Healthcare Providers: https://www.young.biz/https://www.fda.gov/media/142435/download This test is not yet approved or cleared by the [place] FDA and  has been authorized for detection and/or diagnosis of SARS-CoV-2 by  FDA under an Emergency Use Authorization (EUA). This EUA will remain  in effect (meaning this test can be used) for the duration of the  Covid-19 declaration under Section 564(b)(1) of the Act, 21  U.S.C. section 360bbb-3(b)(1), unless the authorization is  terminated or revoked. Performed at [facility], [address] [place], [place] [address]      Radiological Exams on Admission: CT Angio Chest PE W/Cm &/Or Wo Cm  Result Date: 08/03/2019 CLINICAL DATA:  Shortness of breath. EXAM: CT ANGIOGRAPHY  CHEST WITH CONTRAST TECHNIQUE: Multidetector CT imaging of the chest was performed using the standard protocol during bolus administration of intravenous contrast. Multiplanar CT image reconstructions and MIPs were obtained to evaluate the vascular anatomy. CONTRAST:  80mL OMNIPAQUE IOHEXOL 350 MG/ML SOLN COMPARISON:  CT angiogram chest June 14, 2018 FINDINGS: Cardiovascular: There is no demonstrable pulmonary embolus. There is no thoracic aortic aneurysm or dissection. Visualized great vessels appear unremarkable. There are foci of aortic atherosclerosis. There are foci coronary artery calcification. There is no appreciable pericardial effusion or pericardial thickening. Mediastinum/Nodes: Visualized thyroid appears normal. There are scattered subcentimeter mediastinal lymph nodes. There is no adenopathy by size criteria. No esophageal lesions are evident. Lungs/Pleura: There is underlying centrilobular emphysematous change. No edema or airspace opacity. No appreciable pleural effusions. Upper Abdomen: There is left adrenal hypertrophy, a finding also present previously. Visualized upper abdominal structures otherwise appear unremarkable. Musculoskeletal: There are no blastic or lytic bone lesions. No evident chest wall lesions. Review of the MIP images confirms the above findings. IMPRESSION: 1. No demonstrable pulmonary embolus. No thoracic aortic aneurysm or dissection. There is aortic atherosclerosis as well as foci of coronary artery calcification. 2. Underlying centrilobular emphysema. No edema or airspace opacity. 3.  No evident adenopathy. 4. Left adrenal hypertrophy, a finding of uncertain etiology or significance. Aortic Atherosclerosis (ICD10-I70.0) and Emphysema (ICD10-J43.9). Electronically Signed   By: Bretta BangWilliam  Woodruff III M.D.   On: 08/03/2019 19:43   CT ABDOMEN PELVIS W CONTRAST  Result Date: 08/03/2019 CLINICAL DATA:  Abdominal pain and black tarry stools. EXAM: CT ABDOMEN AND PELVIS WITH  CONTRAST TECHNIQUE: Multidetector CT imaging of the abdomen and pelvis was performed using the standard protocol following bolus administration of intravenous contrast. CONTRAST:  80mL OMNIPAQUE IOHEXOL 350 MG/ML SOLN COMPARISON:  06/03/2018 FINDINGS: Lower chest: The lung bases are clear of acute process. No pleural effusion or pulmonary lesions. The heart is normal in size. No pericardial effusion. The distal esophagus and aorta are unremarkable. Hepatobiliary: No focal hepatic lesions or intrahepatic biliary dilatation. The gallbladder is surgically absent. No common bile duct dilatation. Pancreas: No mass, inflammation or ductal dilatation. Spleen: Normal size. No focal lesions. Adrenals/Urinary Tract: Stable left adrenal gland nodules,  likely benign adenomas. The right adrenal gland is unremarkable. Both kidneys are unremarkable. Stomach/Bowel: The stomach, duodenum, small bowel and colon are unremarkable. No acute inflammatory changes, mass lesions or obstructive findings. The terminal ileum appears normal. The appendix is surgically absent. There is a moderate amount of fluid throughout the colon with scattered air-fluid levels typically seen with diarrhea. I do not see any wall thickening or pericolonic inflammatory changes. Vascular/Lymphatic: Moderate atherosclerotic calcifications involving the aorta and iliac arteries but no aneurysm or dissection. The branch vessels are patent. The major venous structures are patent. No mesenteric or retroperitoneal mass or adenopathy. Reproductive: Surgically absent. Other: No pelvic mass or adenopathy. No free pelvic fluid collections. No inguinal mass or adenopathy. No abdominal wall hernia or subcutaneous lesions. Musculoskeletal: Scoliosis and degenerative lumbar spondylosis with multilevel disc disease and facet disease. No acute bony findings or bone lesions. IMPRESSION: 1. Moderate fluid and scattered air-fluid levels throughout the colon typically seen with  diarrhea. No colonic wall thickening or pericolonic inflammatory changes. 2. No other acute abdominal/pelvic findings and no mass lesions or adenopathy. 3. Status post cholecystectomy. No biliary dilatation. 4. Stable left adrenal gland nodules, likely benign adenomas. Electronically Signed   By: Rudie MeyerP.  Gallerani M.D.   On: 08/03/2019 19:43   DG Chest Port 1 View  Result Date: 08/03/2019 CLINICAL DATA:  54 year old female right side chest pain. Bloody stools for 3 days. EXAM: PORTABLE CHEST 1 VIEW COMPARISON:  [facility] portable chest 09/03/2018 and earlier. FINDINGS: Portable AP upright view at 1703 hours. Attenuated bronchovascular markings in the lungs in keeping with emphysema demonstrated by CT on 12/01/2018. Stable lung volumes. Mediastinal contours are stable with some central pulmonary artery enlargement. Visualized tracheal air column is within normal limits. Allowing for portable technique the lungs are clear. No acute osseous abnormality identified. IMPRESSION: 1.  No acute cardiopulmonary abnormality. 2. Emphysema (ICD10-J43.9). Chronic central pulmonary artery enlargement raising the possibility of pulmonary artery hypertension. Electronically Signed   By: Odessa FlemingH  Hall M.D.   On: 08/03/2019 17:22    EKG: Independently reviewed. Sinus rhythm, ST depressions, QTc 527 ms.   Assessment/Plan   1. Acute upper GI bleeding; blood-loss anemia  - Presents with abdominal pain, chest pain, and loose dark stools, and is found to have Hgb 10.3, down from 12.4 a month earlier, and FOBT is positive  - Patient reports remote hx of PUD, has been using ibuprofen most days, and BUN is 58 which is higher than priors  - She is mildly tachycardic in ED with stable BP  - GI consulted by ED physician and much appreciated  - Continue IV PPI, continue bowel rest, type and screen, follow serial H&H and transfuse as needed    2. Chest pain; elevated troponin  - Presents with chest pain, abdominal pain, and loose  dark stools, and is found to have ST-depressions on EKG with HS troponin 155, then 182  - She had LHC 06/29/19 with 40-60% mid RCA stenosis that was unchanged or improved from a yr prior and medications & smoking cessation were recommended  - Cardiology consulting and much appreciated, will follow-up recommendations   3. Hypokalemia  - Serum potassium is 2.1 in ED in setting of loose stools  - She was started on IV potassium in ED, will add oral potassium, continue cardiac monitoring, and repeat chem panel in am   4. Prolonged QT interval  - QTc is 527 ms in ED, likely secondary to severe hypokalemia  - Replace potassium, continue  cardiac monitoring, minimize QT-prolonging medications, and repeat EKG in am    5. COPD  - Patient reports that chronic cough is stable  - Continue breathing treatments    6. SIRS  - Presents with loose dark stools and is found to be afebrile with WBC 21k and lactate 2.8  - Chronic cough stable, denies urinary sxs, has diarrhea and antibiotic (azithromycin) use last month  - Blood and urine cultures collected in ED, GI pathogen panel was ordered, she was given 2 liters NS, and Rocephin and Flagyl were administered  - Trend lactate, follow-up GI panel and cultures, hold further abx for now   7. AKI  - SCr is 1.55 on admission, up from 0.88 last month  - Likely acute prerenal azotemia in setting of loose stools  - She was given 2 liters NS in ED  - Renally-dose medications, continue IVF hydration, repeat chem panel in am     DVT prophylaxis: SCD's  Code Status: Full  Family Communication: Discussed with patient  Consults called: Cardiology and GI consulted by ED physician Admission status: Inpatient. Patient has acute upper GI bleeding complicated by critical hypokalemia, chest pain with EKG changes and elevated cardiac enzymes, will require inpatient management with specialist consultation and is not reasonably expected to be stable for discharge within  observation timeframe.     Briscoe Deutscher, MD [facility] Pager: See [email or web address]  If 7AM-7PM, please contact the daytime attending [email or web address]  08/03/2019, 11:59 PM

## 2019-08-03 NOTE — ED Triage Notes (Signed)
Pt arrives via EMS from home with reports of abd pain and black tarry stools for 3 days. Endorses some chest pain, right sided described as achy throbbing and worse with movement.

## 2019-08-03 NOTE — ED Provider Notes (Addendum)
[facility] EMERGENCY DEPARTMENT Provider Note   CSN: [id number] Arrival date & time: 08/03/19  1510     History Chief Complaint  Patient presents with  . Abdominal Pain    Bethany Stuart is a 54 y.o. female with a past medical history of COPD, CAD, hypertension resenting to the ED with a chief complaint of black tarry stools and abdominal cramping.  3 days ago started having several episodes of black stools and generalized abdominal cramping as well as some nonbloody, nonbilious emesis.  She did eat some barbecue ribs before symptom onset but no other family members with similar symptoms.  She has not taken any medications to help with her symptoms.  She also noted around the same time started having right-sided chest pain and shortness of breath which is not different than her baseline due to her COPD.  She reports history of chest pain in the past but states that this feels different.  Denies any cough, fever but does endorse chills.  Denies any urinary symptoms.  She reports compliance with her home aspirin and Plavix.  HPI     Past Medical History:  Diagnosis Date  . Arthritis    Back  . Bulging lumbar disc   . COPD (chronic obstructive pulmonary disease) (HCC)   . Depression   . Hypertension   . Sciatica     Patient Active Problem List   Diagnosis Date Noted  . Coronary artery disease involving native coronary artery of native heart without angina pectoris 06/24/2019  . Syncope 06/24/2019  . Essential hypertension 06/24/2019  . 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 Acute on chronic respiratory failure with hypoxia (HCC)   . Hypokalemia 06/15/2018  . Hypomagnesemia 06/15/2018  . COPD exacerbation (HCC) 06/14/2018  . Chest pain 06/14/2018    Past Surgical History:  Procedure Laterality Date  . ABDOMINAL HYSTERECTOMY    . APPENDECTOMY    . CHOLECYSTECTOMY    . FOOT SURGERY    . INTRAVASCULAR PRESSURE WIRE/FFR STUDY N/A 06/15/2018     Procedure: INTRAVASCULAR PRESSURE WIRE/FFR STUDY;  Surgeon: Elder Negus, MD;  Location: [facility] INVASIVE CV LAB;  Service: Cardiovascular;  Laterality: N/A;  . KNEE SURGERY Right   . LEFT HEART CATH AND CORONARY ANGIOGRAPHY N/A 06/15/2018   Procedure: LEFT HEART CATH AND CORONARY ANGIOGRAPHY;  Surgeon: Elder Negus, MD;  Location: [facility] INVASIVE CV LAB;  Service: Cardiovascular;  Laterality: N/A;  . LEFT HEART CATH AND CORONARY ANGIOGRAPHY N/A 06/29/2019   Procedure: LEFT HEART CATH AND CORONARY ANGIOGRAPHY;  Surgeon: Lyn Records, MD;  Location: [facility] INVASIVE CV LAB;  Service: Cardiovascular;  Laterality: N/A;     OB History   No obstetric history on file.     Family History  Problem Relation Age of Onset  . Pneumonia Mother   . CAD Mother        CABG  . Hypertension Mother   . Emphysema Father   . Fibromyalgia Sister   . Heart attack Brother   . Diabetes Maternal Grandmother   . Heart attack Maternal Grandfather   . Fibromyalgia Sister     Social History   Tobacco Use  . Smoking status: Current Every Day Smoker  . Smokeless tobacco: Never Used  Substance Use Topics  . Alcohol use: Yes  . Drug use: No    Home Medications Prior to Admission medications   Medication Sig Start Date End Date Taking? Authorizing Provider  ALPRAZolam Prudy Feeler) 0.5 MG tablet  Take 0.5 mg by mouth daily as needed for anxiety.   Yes [provider]  aspirin EC 81 MG EC tablet Take 1 tablet (81 mg total) by mouth daily. 06/17/18  Yes Alwyn Ren, MD  atorvastatin (LIPITOR) 40 MG tablet Take 40 mg by mouth daily.   Yes [provider]  carvedilol (COREG) 25 MG tablet Take 1 tablet (25 mg total) by mouth 2 (two) times daily. 07/08/19 10/06/19 Yes Tobb, Kardie, DO  clopidogrel (PLAVIX) 75 MG tablet Take 1 tablet (75 mg total) by mouth daily. Continue aspirin and Plavix together for 1 year Patient taking differently: Take 75 mg by mouth daily.  06/17/18  Yes Alwyn Ren, MD  diltiazem (CARDIZEM CD) 120 MG 24 hr capsule Take 1 capsule (120 mg total) by mouth daily. 07/08/19 10/06/19 Yes Tobb, Kardie, DO  FLUoxetine (PROZAC) 20 MG tablet Take 20 mg by mouth daily.   Yes [provider]  Fluticasone-Umeclidin-Vilant (TRELEGY ELLIPTA) 100-62.5-25 MCG/INH AEPB Inhale 1 puff into the lungs daily.   Yes [provider]  hydrochlorothiazide (HYDRODIURIL) 25 MG tablet Take 1 tablet (25 mg total) by mouth daily. 07/08/19  Yes Tobb, Kardie, DO  ipratropium-albuterol (DUONEB) 0.5-2.5 (3) MG/3ML SOLN Take 3 mLs by nebulization every 4 (four) hours as needed (for shortness of breath).    Yes [provider]  traZODone (DESYREL) 100 MG tablet Take 100 mg by mouth at bedtime.   Yes [provider]    Allergies    Fioricet [butalbital-apap-caffeine], Klonopin [clonazepam], and Penicillins  Review of Systems   Review of Systems  Constitutional: Negative for appetite change, chills and fever.  HENT: Negative for ear pain, rhinorrhea, sneezing and sore throat.   Eyes: Negative for photophobia and visual disturbance.  Respiratory: Positive for shortness of breath. Negative for cough, chest tightness and wheezing.   Cardiovascular: Positive for chest pain. Negative for palpitations.  Gastrointestinal: Positive for abdominal pain, blood in stool, nausea and vomiting. Negative for constipation and diarrhea.  Genitourinary: Negative for dysuria, hematuria and urgency.  Musculoskeletal: Negative for myalgias.  Skin: Negative for rash.  Neurological: Negative for dizziness, weakness and light-headedness.    Physical Exam Updated Vital Signs BP (!) 133/59   Pulse 89   Temp 99.5 F (37.5 C) (Oral)   Resp 19   Ht 5\' 7"  (1.702 m)   Wt 88.9 kg   SpO2 98%   BMI 30.70 kg/m   Physical Exam Vitals and nursing note reviewed. Exam conducted with a chaperone present.  Constitutional:      General: She is not in acute distress.     Appearance: She is well-developed.  HENT:     Head: Normocephalic and atraumatic.     Nose: Nose normal.  Eyes:     General: No scleral icterus.       Left eye: No discharge.     Conjunctiva/sclera: Conjunctivae normal.  Cardiovascular:     Rate and Rhythm: Regular rhythm. Tachycardia present.     Heart sounds: Normal heart sounds. No murmur. No friction rub. No gallop.   Pulmonary:     Effort: Pulmonary effort is normal. No respiratory distress.     Breath sounds: Normal breath sounds.  Abdominal:     General: Bowel sounds are normal. There is no distension.     Palpations: Abdomen is soft.     Tenderness: There is abdominal tenderness (Generalized). There is no guarding.  Genitourinary:    Comments: Rectal exam with black  stool.  No tenderness. Musculoskeletal:        General: Normal range of motion.     Cervical back: Normal range of motion and neck supple.     Right lower leg: No edema.     Left lower leg: No edema.     Comments: No lower extremity edema, erythema or calf tenderness bilaterally.  Skin:    General: Skin is warm and dry.     Findings: No rash.  Neurological:     Mental Status: She is alert.     Motor: No abnormal muscle tone.     Coordination: Coordination normal.     ED Results / Procedures / Treatments   Labs (all labs ordered are listed, but only abnormal results are displayed) Labs Reviewed  COMPREHENSIVE METABOLIC PANEL - Abnormal; Notable for the following components:      Result Value   Potassium 2.1 (*)    Chloride 94 (*)    Glucose, Bld 148 (*)    BUN 58 (*)    Creatinine, Ser 1.55 (*)    Total Protein 5.5 (*)    Albumin 3.2 (*)    GFR calc non Af Amer 38 (*)    GFR calc Af Amer 44 (*)    All other components within normal limits  CBC - Abnormal; Notable for the following components:   WBC 21.0 (*)    RBC 3.30 (*)    Hemoglobin 10.3 (*)    HCT 30.8 (*)    RDW 16.1 (*)    nRBC 0.3 (*)    All other components within normal limits    LACTIC ACID, PLASMA - Abnormal; Notable for the following components:   Lactic Acid, Venous 2.8 (*)    All other components within normal limits  LACTIC ACID, PLASMA - Abnormal; Notable for the following components:   Lactic Acid, Venous 2.2 (*)    All other components within normal limits  APTT - Abnormal; Notable for the following components:   aPTT 21 (*)    All other components within normal limits  URINALYSIS, ROUTINE W REFLEX MICROSCOPIC - Abnormal; Notable for the following components:   APPearance HAZY (*)    Hgb urine dipstick MODERATE (*)    Bacteria, UA RARE (*)    All other components within normal limits  MAGNESIUM - Abnormal; Notable for the following components:   Magnesium 3.6 (*)    All other components within normal limits  POC OCCULT BLOOD, ED - Abnormal; Notable for the following components:   Fecal Occult Bld POSITIVE (*)    All other components within normal limits  TROPONIN I (HIGH SENSITIVITY) - Abnormal; Notable for the following components:   Troponin I (High Sensitivity) 155 (*)    All other components within normal limits  RESPIRATORY PANEL BY RT PCR (FLU A&B, COVID)  CULTURE, BLOOD (ROUTINE X 2)  CULTURE, BLOOD (ROUTINE X 2)  URINE CULTURE  GI PATHOGEN PANEL BY PCR, STOOL  PROTIME-INR  I-STAT BETA HCG BLOOD, ED ([facility], [facility], [facility] ONLY)  TYPE AND SCREEN  ABO/RH  TROPONIN I (HIGH SENSITIVITY)    EKG EKG Interpretation  Date/Time:  Tuesday August 03 2019 21:27:31 EST Ventricular Rate:  91 PR Interval:  128 QRS Duration: 90 QT Interval:  428 QTC Calculation: 527 R Axis:   71 Text Interpretation: Sinus rhythm Repol abnrm, severe global ischemia (LM/MVD) Prolonged QT interval ST depressions improved Confirmed by Richardean Canal [id number]) on 08/03/2019 9:29:46 PM   Radiology CT Angio  Chest PE W/Cm &/Or Wo Cm  Result Date: 08/03/2019 CLINICAL DATA:  Shortness of breath. EXAM: CT ANGIOGRAPHY CHEST WITH CONTRAST TECHNIQUE: Multidetector CT imaging of the  chest was performed using the standard protocol during bolus administration of intravenous contrast. Multiplanar CT image reconstructions and MIPs were obtained to evaluate the vascular anatomy. CONTRAST:  71mL OMNIPAQUE IOHEXOL 350 MG/ML SOLN COMPARISON:  CT angiogram chest June 14, 2018 FINDINGS: Cardiovascular: There is no demonstrable pulmonary embolus. There is no thoracic aortic aneurysm or dissection. Visualized great vessels appear unremarkable. There are foci of aortic atherosclerosis. There are foci coronary artery calcification. There is no appreciable pericardial effusion or pericardial thickening. Mediastinum/Nodes: Visualized thyroid appears normal. There are scattered subcentimeter mediastinal lymph nodes. There is no adenopathy by size criteria. No esophageal lesions are evident. Lungs/Pleura: There is underlying centrilobular emphysematous change. No edema or airspace opacity. No appreciable pleural effusions. Upper Abdomen: There is left adrenal hypertrophy, a finding also present previously. Visualized upper abdominal structures otherwise appear unremarkable. Musculoskeletal: There are no blastic or lytic bone lesions. No evident chest wall lesions. Review of the MIP images confirms the above findings. IMPRESSION: 1. No demonstrable pulmonary embolus. No thoracic aortic aneurysm or dissection. There is aortic atherosclerosis as well as foci of coronary artery calcification. 2. Underlying centrilobular emphysema. No edema or airspace opacity. 3.  No evident adenopathy. 4. Left adrenal hypertrophy, a finding of uncertain etiology or significance. Aortic Atherosclerosis (ICD10-I70.0) and Emphysema (ICD10-J43.9). Electronically Signed   By: Lowella Grip III M.D.   On: 08/03/2019 19:43   CT ABDOMEN PELVIS W CONTRAST  Result Date: 08/03/2019 CLINICAL DATA:  Abdominal pain and black tarry stools. EXAM: CT ABDOMEN AND PELVIS WITH CONTRAST TECHNIQUE: Multidetector CT imaging of the abdomen and  pelvis was performed using the standard protocol following bolus administration of intravenous contrast. CONTRAST:  32mL OMNIPAQUE IOHEXOL 350 MG/ML SOLN COMPARISON:  06/03/2018 FINDINGS: Lower chest: The lung bases are clear of acute process. No pleural effusion or pulmonary lesions. The heart is normal in size. No pericardial effusion. The distal esophagus and aorta are unremarkable. Hepatobiliary: No focal hepatic lesions or intrahepatic biliary dilatation. The gallbladder is surgically absent. No common bile duct dilatation. Pancreas: No mass, inflammation or ductal dilatation. Spleen: Normal size. No focal lesions. Adrenals/Urinary Tract: Stable left adrenal gland nodules, likely benign adenomas. The right adrenal gland is unremarkable. Both kidneys are unremarkable. Stomach/Bowel: The stomach, duodenum, small bowel and colon are unremarkable. No acute inflammatory changes, mass lesions or obstructive findings. The terminal ileum appears normal. The appendix is surgically absent. There is a moderate amount of fluid throughout the colon with scattered air-fluid levels typically seen with diarrhea. I do not see any wall thickening or pericolonic inflammatory changes. Vascular/Lymphatic: Moderate atherosclerotic calcifications involving the aorta and iliac arteries but no aneurysm or dissection. The branch vessels are patent. The major venous structures are patent. No mesenteric or retroperitoneal mass or adenopathy. Reproductive: Surgically absent. Other: No pelvic mass or adenopathy. No free pelvic fluid collections. No inguinal mass or adenopathy. No abdominal wall hernia or subcutaneous lesions. Musculoskeletal: Scoliosis and degenerative lumbar spondylosis with multilevel disc disease and facet disease. No acute bony findings or bone lesions. IMPRESSION: 1. Moderate fluid and scattered air-fluid levels throughout the colon typically seen with diarrhea. No colonic wall thickening or pericolonic inflammatory  changes. 2. No other acute abdominal/pelvic findings and no mass lesions or adenopathy. 3. Status post cholecystectomy. No biliary dilatation. 4. Stable left adrenal gland nodules, likely benign adenomas. Electronically  Signed   By: Rudie MeyerP.  Gallerani M.D.   On: 08/03/2019 19:43   DG Chest Port 1 View  Result Date: 08/03/2019 CLINICAL DATA:  [age] year old female right side chest pain. Bloody stools for 3 days. EXAM: PORTABLE CHEST 1 VIEW COMPARISON:  [facility] portable chest 09/03/2018 and earlier. FINDINGS: Portable AP upright view at 1703 hours. Attenuated bronchovascular markings in the lungs in keeping with emphysema demonstrated by CT on 12/01/2018. Stable lung volumes. Mediastinal contours are stable with some central pulmonary artery enlargement. Visualized tracheal air column is within normal limits. Allowing for portable technique the lungs are clear. No acute osseous abnormality identified. IMPRESSION: 1.  No acute cardiopulmonary abnormality. 2. Emphysema (ICD10-J43.9). Chronic central pulmonary artery enlargement raising the possibility of pulmonary artery hypertension. Electronically Signed   By: Odessa FlemingH  Hall M.D.   On: 08/03/2019 17:22    Procedures .Critical Care Performed by: Dietrich PatesKhatri, Shauntell Iglesia, PA-C Authorized by: Dietrich PatesKhatri, Thao Bauza, PA-C   Critical care provider statement:    Critical care time (minutes):  35   Critical care was necessary to treat or prevent imminent or life-threatening deterioration of the following conditions:  Cardiac failure, circulatory failure, CNS failure or compromise, respiratory failure, endocrine crisis and metabolic crisis   Critical care was time spent personally by me on the following activities:  Development of treatment plan with patient or surrogate, discussions with consultants, evaluation of patient's response to treatment, examination of patient, ordering and performing treatments and interventions, ordering and review of laboratory studies, ordering and review of  radiographic studies, pulse oximetry, re-evaluation of patient's condition, review of old charts and obtaining history from patient or surrogate   I assumed direction of critical care for this patient from another provider in my specialty: no     (including critical care time)  Medications Ordered in ED Medications  cefTRIAXone (ROCEPHIN) 2 g in sodium chloride 0.9 % 100 mL IVPB (0 g Intravenous Stopped 08/03/19 1822)  potassium chloride 10 mEq in 100 mL IVPB (10 mEq Intravenous New Bag/Given 08/03/19 2155)  pantoprazole (PROTONIX) 80 mg in sodium chloride 0.9 % 250 mL (0.32 mg/mL) infusion (8 mg/hr Intravenous New Bag/Given 08/03/19 2106)  sodium chloride 0.9 % bolus 1,000 mL (0 mLs Intravenous Stopped 08/03/19 2155)  metroNIDAZOLE (FLAGYL) IVPB 500 mg (0 mg Intravenous Stopped 08/03/19 1928)  sodium chloride 0.9 % bolus 1,000 mL (0 mLs Intravenous Stopped 08/03/19 1948)  pantoprazole (PROTONIX) 80 mg in sodium chloride 0.9 % 100 mL IVPB (0 mg Intravenous Stopped 08/03/19 2104)  iohexol (OMNIPAQUE) 350 MG/ML injection 80 mL (80 mLs Intravenous Contrast Given 08/03/19 1917)    ED Course  I have reviewed the triage vital signs and the nursing notes.  Pertinent labs & imaging results that were available during my care of the patient were reviewed by me and considered in my medical decision making (see chart for details).    MDM Rules/Calculators/A&P                      [age] year old female with a past medical history of COPD, CAD, hypertension presenting to the ED with a chief complaint of black tarry stools and abdominal cramping.  3 days ago started having symptoms without specific trigger.  Also complains of right-sided chest pain and shortness of breath.  Reports shortness of breath is similar to her baseline COPD shortness of breath.  She is currently anticoagulated with aspirin and Plavix at home.  On exam there is generalized abdominal tenderness without rebound or  guarding.  Initial  tachycardia has improved.  She is afebrile.  Work-up here significant for leukocytosis of 21, hypokalemia of 2.1, hemoglobin of 10.3 which is about a 2pt drop from her most recent lab work last month.  EKG shows ST changes which most likely are from her hypokalemia.  Troponin elevated at 155 which I also believe is due to her electrolyte abnormalities and multifactorial.  CTA of the chest and CT of the abdomen pelvis are negative for acute abnormality, does show changes consistent with diarrheal illness.  Due to patient's remote history of stomach ulcers, her current complaints of tarry stool, hypokalemia feel she will need admission for management and further work-up.  Will consult GI.  [facility]  Recommends that we place patient on Protonix gtt, n.p.o. after midnight for possible endoscopy.  Repeat EKG with resolution of ST changes. Delta troponin is 182. I will consult cardiology. They will see the patient in consult.  Final Clinical Impression(s) / ED Diagnoses Final diagnoses:  Hypokalemia  Acute GI bleeding    Rx / DC Orders ED Discharge Orders    None        Dietrich PatesKhatri, Garrie Woodin, PA-C 08/03/19 2248    Charlynne PanderYao, David Hsienta, MD 08/03/19 2302

## 2019-08-04 ENCOUNTER — Other Ambulatory Visit (HOSPITAL_COMMUNITY): Payer: Medicaid Other

## 2019-08-04 ENCOUNTER — Encounter (HOSPITAL_COMMUNITY): Payer: Self-pay | Admitting: Family Medicine

## 2019-08-04 ENCOUNTER — Encounter (HOSPITAL_COMMUNITY)
Admission: EM | Disposition: A | Discharge status: Home / Self Care | Payer: Self-pay | Source: Home / Self Care | Attending: Internal Medicine

## 2019-08-04 ENCOUNTER — Inpatient Hospital Stay (HOSPITAL_COMMUNITY): Payer: Medicaid Other | Admitting: Registered Nurse

## 2019-08-04 DIAGNOSIS — K3189 Other diseases of stomach and duodenum: Secondary | ICD-10-CM

## 2019-08-04 DIAGNOSIS — E876 Hypokalemia: Secondary | ICD-10-CM

## 2019-08-04 DIAGNOSIS — N179 Acute kidney failure, unspecified: Secondary | ICD-10-CM

## 2019-08-04 DIAGNOSIS — R079 Chest pain, unspecified: Secondary | ICD-10-CM

## 2019-08-04 DIAGNOSIS — K922 Gastrointestinal hemorrhage, unspecified: Secondary | ICD-10-CM

## 2019-08-04 DIAGNOSIS — I2510 Atherosclerotic heart disease of native coronary artery without angina pectoris: Secondary | ICD-10-CM

## 2019-08-04 DIAGNOSIS — D62 Acute posthemorrhagic anemia: Secondary | ICD-10-CM

## 2019-08-04 DIAGNOSIS — K259 Gastric ulcer, unspecified as acute or chronic, without hemorrhage or perforation: Secondary | ICD-10-CM

## 2019-08-04 DIAGNOSIS — K921 Melena: Secondary | ICD-10-CM

## 2019-08-04 HISTORY — PX: ESOPHAGOGASTRODUODENOSCOPY: SHX5428

## 2019-08-04 HISTORY — PX: BIOPSY: SHX5522

## 2019-08-04 LAB — BASIC METABOLIC PANEL
Anion gap: 7 (ref 5–15)
BUN: 31 mg/dL — ABNORMAL HIGH (ref 6–20)
CO2: 27 mmol/L (ref 22–32)
Calcium: 7.3 mg/dL — ABNORMAL LOW (ref 8.9–10.3)
Chloride: 108 mmol/L (ref 98–111)
Creatinine, Ser: 1.03 mg/dL — ABNORMAL HIGH (ref 0.44–1.00)
GFR calc Af Amer: >60 mL/min (ref >60)
GFR calc non Af Amer: >60 mL/min (ref >60)
Glucose, Bld: 105 mg/dL — ABNORMAL HIGH (ref 70–99)
Potassium: 2.9 mmol/L — ABNORMAL LOW (ref 3.5–5.1)
Sodium: 142 mmol/L (ref 135–145)

## 2019-08-04 LAB — TROPONIN I (HIGH SENSITIVITY)
Troponin I (High Sensitivity): 134 ng/L (ref <18)
Troponin I (High Sensitivity): 182 ng/L (ref <18)

## 2019-08-04 LAB — PREPARE RBC (CROSSMATCH)

## 2019-08-04 LAB — LACTIC ACID, PLASMA: Lactic Acid, Venous: 1.8 mmol/L (ref 0.5–1.9)

## 2019-08-04 LAB — MAGNESIUM: Magnesium: 2.9 mg/dL — ABNORMAL HIGH (ref 1.7–2.4)

## 2019-08-04 LAB — HEMOGLOBIN AND HEMATOCRIT, BLOOD
HCT: 22.6 % — ABNORMAL LOW (ref 36.0–46.0)
HCT: 28.9 % — ABNORMAL LOW (ref 36.0–46.0)
Hemoglobin: 7.3 g/dL — ABNORMAL LOW (ref 12.0–15.0)
Hemoglobin: 9.3 g/dL — ABNORMAL LOW (ref 12.0–15.0)

## 2019-08-04 LAB — POTASSIUM: Potassium: 3.2 mmol/L — ABNORMAL LOW (ref 3.5–5.1)

## 2019-08-04 SURGERY — EGD (ESOPHAGOGASTRODUODENOSCOPY)
Anesthesia: Monitor Anesthesia Care

## 2019-08-04 MED ORDER — LIDOCAINE 2% (20 MG/ML) 5 ML SYRINGE
INTRAMUSCULAR | Status: DC | PRN
  Administered 2019-08-04: 40 mg via INTRAVENOUS

## 2019-08-04 MED ORDER — ALBUMIN HUMAN 5 % IV SOLN: INTRAVENOUS | Status: DC | PRN

## 2019-08-04 MED ORDER — HYDROCODONE-ACETAMINOPHEN 5-325 MG PO TABS: 1.0000 | ORAL_TABLET | Freq: Four times a day (QID) | ORAL | Status: DC | PRN

## 2019-08-04 MED ORDER — FENTANYL CITRATE (PF) 100 MCG/2ML IJ SOLN
12.5000 ug | INTRAMUSCULAR | Status: DC | PRN
  Administered 2019-08-04: 12.5 ug via INTRAVENOUS
  Filled 2019-08-04: qty 2

## 2019-08-04 MED ORDER — ACETAMINOPHEN 650 MG RE SUPP: 650.0000 mg | Freq: Four times a day (QID) | RECTAL | Status: DC | PRN

## 2019-08-04 MED ORDER — IPRATROPIUM-ALBUTEROL 0.5-2.5 (3) MG/3ML IN SOLN
3.0000 mL | RESPIRATORY_TRACT | Status: DC | PRN
  Administered 2019-08-04 – 2019-08-05 (×3): 3 mL via RESPIRATORY_TRACT
  Filled 2019-08-04 (×3): qty 3

## 2019-08-04 MED ORDER — SODIUM CHLORIDE 0.9 % IV SOLN: INTRAVENOUS | Status: DC

## 2019-08-04 MED ORDER — ALPRAZOLAM 0.5 MG PO TABS
0.5000 mg | ORAL_TABLET | Freq: Every day | ORAL | Status: DC | PRN
  Administered 2019-08-04: 0.5 mg via ORAL
  Filled 2019-08-04: qty 1

## 2019-08-04 MED ORDER — UMECLIDINIUM BROMIDE 62.5 MCG/INH IN AEPB
1.0000 | INHALATION_SPRAY | Freq: Every day | RESPIRATORY_TRACT | Status: DC
  Administered 2019-08-04 – 2019-08-06 (×3): 1 via RESPIRATORY_TRACT
  Filled 2019-08-04: qty 7

## 2019-08-04 MED ORDER — FLUOXETINE HCL 20 MG PO TABS: 20.0000 mg | ORAL_TABLET | Freq: Every day | ORAL | Status: DC

## 2019-08-04 MED ORDER — PROPOFOL 10 MG/ML IV BOLUS
INTRAVENOUS | Status: DC | PRN
  Administered 2019-08-04 (×4): 50 mg via INTRAVENOUS

## 2019-08-04 MED ORDER — IPRATROPIUM-ALBUTEROL 0.5-2.5 (3) MG/3ML IN SOLN: 3.0000 mL | RESPIRATORY_TRACT | Status: DC | PRN

## 2019-08-04 MED ORDER — POTASSIUM CHLORIDE CRYS ER 20 MEQ PO TBCR: 20.0000 meq | EXTENDED_RELEASE_TABLET | Freq: Once | ORAL | Status: DC

## 2019-08-04 MED ORDER — PROPOFOL 500 MG/50ML IV EMUL
INTRAVENOUS | Status: DC | PRN
  Administered 2019-08-04: 100 ug/kg/min via INTRAVENOUS

## 2019-08-04 MED ORDER — SODIUM CHLORIDE 0.9% IV SOLUTION: Freq: Once | INTRAVENOUS | Status: AC

## 2019-08-04 MED ORDER — FLUTICASONE FUROATE-VILANTEROL 100-25 MCG/INH IN AEPB
1.0000 | INHALATION_SPRAY | Freq: Every day | RESPIRATORY_TRACT | Status: DC
  Administered 2019-08-04 – 2019-08-06 (×3): 1 via RESPIRATORY_TRACT
  Filled 2019-08-04: qty 28

## 2019-08-04 MED ORDER — TRAZODONE HCL 100 MG PO TABS
100.0000 mg | ORAL_TABLET | Freq: Every day | ORAL | Status: DC
  Administered 2019-08-04 – 2019-08-05 (×3): 100 mg via ORAL
  Filled 2019-08-04 (×3): qty 1

## 2019-08-04 MED ORDER — FLUTICASONE-UMECLIDIN-VILANT 100-62.5-25 MCG/INH IN AEPB: 1.0000 | INHALATION_SPRAY | Freq: Every day | RESPIRATORY_TRACT | Status: DC

## 2019-08-04 MED ORDER — ATORVASTATIN CALCIUM 40 MG PO TABS
40.0000 mg | ORAL_TABLET | Freq: Every day | ORAL | Status: DC
  Administered 2019-08-04 – 2019-08-06 (×3): 40 mg via ORAL
  Filled 2019-08-04 (×3): qty 1

## 2019-08-04 MED ORDER — SODIUM CHLORIDE 0.9% FLUSH
3.0000 mL | Freq: Two times a day (BID) | INTRAVENOUS | Status: DC
  Administered 2019-08-04 – 2019-08-06 (×5): 3 mL via INTRAVENOUS

## 2019-08-04 MED ORDER — POTASSIUM CHLORIDE CRYS ER 20 MEQ PO TBCR
40.0000 meq | EXTENDED_RELEASE_TABLET | Freq: Two times a day (BID) | ORAL | Status: DC
  Administered 2019-08-04 – 2019-08-06 (×5): 40 meq via ORAL
  Filled 2019-08-04 (×5): qty 2

## 2019-08-04 MED ORDER — PHENYLEPHRINE 40 MCG/ML (10ML) SYRINGE FOR IV PUSH (FOR BLOOD PRESSURE SUPPORT)
PREFILLED_SYRINGE | INTRAVENOUS | Status: DC | PRN
  Administered 2019-08-04 (×2): 120 ug via INTRAVENOUS
  Administered 2019-08-04 (×2): 80 ug via INTRAVENOUS

## 2019-08-04 MED ORDER — POTASSIUM CHLORIDE 20 MEQ PO PACK
40.0000 meq | PACK | Freq: Once | ORAL | Status: AC
  Administered 2019-08-04: 40 meq via ORAL
  Filled 2019-08-04: qty 2

## 2019-08-04 MED ORDER — ACETAMINOPHEN 325 MG PO TABS
650.0000 mg | ORAL_TABLET | Freq: Four times a day (QID) | ORAL | Status: DC | PRN
  Administered 2019-08-04 – 2019-08-05 (×4): 650 mg via ORAL
  Filled 2019-08-04 (×4): qty 2

## 2019-08-04 MED ORDER — FENTANYL CITRATE (PF) 100 MCG/2ML IJ SOLN
12.5000 ug | Freq: Once | INTRAMUSCULAR | Status: AC
  Administered 2019-08-04: 12.5 ug via INTRAVENOUS
  Filled 2019-08-04: qty 2

## 2019-08-04 NOTE — Progress Notes (Signed)
Orders received. Currently transfusing 1 unit PRBC. See blood administration flow sheet.

## 2019-08-04 NOTE — Consult Note (Addendum)
[facility] Consult Note   History Bethany Stuart MRN # [id number]  Date of Admission: 08/03/2019 Date of Consultation: 08/04/2019 Referring physician: Dr. Briscoe Deutscher, MD Primary Care Provider: Eloisa Northern, MD Primary Gastroenterologist: None   Reason for Consultation/Chief Complaint: Melena  Subjective  HPI:  This is a 54 year old woman who came to the ED last evening with about 2 days of passage of black tarry stool.  Yesterday she came because the amount of blood seem to be increasing, and she was having crampy lower abdominal pain.  She has not had hematemesis and denies a previous history of GI bleeding.  She is on aspirin and Plavix for coronary disease, and also takes ibuprofen regularly for chronic low back pain. When I was called last evening by the ED provider, patient was hypotensive and tachycardic, that has now resolved with volume resuscitation and institution of blood products.  She has 2 peripheral IVs, has been given a Protonix 80 mg bolus and is now on a drip at 8 mg an hour.  In addition, when she presented to the ED she had substernal chest pain, elevated troponin to 155, and diffuse ischemic EKG changes.  Repeat troponin overnight went up to 182, third troponin down to 134.  Her lactic acid was initially 2.8, then decrease to 1.8 on most recent check.  Second EKG was a suboptimal baseline, still shows ST depressions in multiple leads.  Her chest pain resolved while in the ED.  She had continued passage of melena after arrival to the medical floor overnight, still not had hematemesis.  ROS:  Constitutional: No recent weight change Respiratory:   Chronic dyspnea Musculoskeletal:   Tonic low back pain   All other systems are negative except as noted above in the HPI  Past Medical History Past Medical History:  Diagnosis Date  . Arthritis    Back  . Bulging lumbar disc   . COPD (chronic obstructive pulmonary disease) (HCC)   . Depression   .  Hypertension   . Sciatica     Past Surgical History Past Surgical History:  Procedure Laterality Date  . ABDOMINAL HYSTERECTOMY    . APPENDECTOMY    . CHOLECYSTECTOMY    . FOOT SURGERY    . INTRAVASCULAR PRESSURE WIRE/FFR STUDY N/A 06/15/2018   Procedure: INTRAVASCULAR PRESSURE WIRE/FFR STUDY;  Surgeon: Elder Negus, MD;  Location: [facility];  Service: Cardiovascular;  Laterality: N/A;  . KNEE SURGERY Right   . LEFT HEART CATH AND CORONARY ANGIOGRAPHY N/A 06/15/2018   Procedure: LEFT HEART CATH AND CORONARY ANGIOGRAPHY;  Surgeon: Elder Negus, MD;  Location: [facility];  Service: Cardiovascular;  Laterality: N/A;  . LEFT HEART CATH AND CORONARY ANGIOGRAPHY N/A 06/29/2019   Procedure: LEFT HEART CATH AND CORONARY ANGIOGRAPHY;  Surgeon: Lyn Records, MD;  Location: [facility];  Service: Cardiovascular;  Laterality: N/A;    Family History Family History  Problem Relation Age of Onset  . Pneumonia Mother   . CAD Mother        CABG  . Hypertension Mother   . Emphysema Father   . Fibromyalgia Sister   . Heart attack Brother   . Diabetes Maternal Grandmother   . Heart attack Maternal Grandfather   . Fibromyalgia Sister     Social History Social History   Socioeconomic History  . Marital status: Single    Spouse name: Not on file  . Number of children: Not on file  .  Years of education: Not on file  . Highest education level: Not on file  Occupational History  . Not on file  Tobacco Use  . Smoking status: Current Every Day Smoker  . Smokeless tobacco: Never Used  Substance and Sexual Activity  . Alcohol use: Yes  . Drug use: No  . Sexual activity: Not on file  Other Topics Concern  . Not on file  Social History Narrative  . Not on file   Social Determinants of Health   Financial Resource Strain:   . Difficulty of Paying Living Expenses: Not on file  Food Insecurity:   . Worried About Programme researcher, broadcasting/film/video in the Last Year:  Not on file  . Ran Out of Food in the Last Year: Not on file  Transportation Needs:   . Lack of Transportation (Medical): Not on file  . Lack of Transportation (Non-Medical): Not on file  Physical Activity:   . Days of Exercise per Week: Not on file  . Minutes of Exercise per Session: Not on file  Stress:   . Feeling of Stress : Not on file  Social Connections:   . Frequency of Communication with Friends and Family: Not on file  . Frequency of Social Gatherings with Friends and Family: Not on file  . Attends Religious Services: Not on file  . Active Member of Clubs or Organizations: Not on file  . Attends Banker Meetings: Not on file  . Marital Status: Not on file    Allergies Allergies  Allergen Reactions  . Fioricet [Butalbital-Apap-Caffeine] Other (See Comments)    "crazy in my head"  . Klonopin [Clonazepam] Other (See Comments)    "crazy in my head"  . Penicillins Rash    Has patient had a PCN reaction causing immediate rash, facial/tongue/throat swelling, SOB or lightheadedness with hypotension: Yes Has patient had a PCN reaction causing severe rash involving mucus membranes or skin necrosis: No Has patient had a PCN reaction that required hospitalization: No Has patient had a PCN reaction occurring within the last 10 years: No If all of the above answers are "NO", then may proceed with Cephalosporin use.     Outpatient Meds Home medications from the H+P and/or nursing med reconciliation reviewed.  Inpatient med list reviewed  _____________________________________________________________________ Objective   Exam:  Current vital signs  Patient Vitals for the past 8 hrs:  BP Temp Temp src Pulse Resp SpO2 Height Weight  08/04/19 0554 127/66 98.4 F (36.9 C) Oral 69 (!) 24 100 % -- --  08/04/19 0530 124/77 98.9 F (37.2 C) Oral 72 16 100 % -- --  08/04/19 0106 133/82 99.4 F (37.4 C) Oral 93 (!) 24 100 % 5\' 7"  (1.702 m) 89.1 kg  08/04/19 0000 --  -- -- 85 19 98 % -- --  08/03/19 2300 122/72 -- -- 82 15 100 % -- --  08/03/19 2245 119/68 -- -- 85 17 100 % -- --    Intake/Output Summary (Last 24 hours) at 08/04/2019 08/06/2019 Last data filed at 08/04/2019 0400 Gross per 24 hour  Intake 3078.04 ml  Output --  Net 3078.04 ml    Physical Exam:    General: this is a chronically ill-appearing patient in no acute distress.  She was sleeping when I enter the room, awakens easily.  She is in no respiratory distress, able to speak full sentences, currently no chest pain.  Eyes: sclera anicteric, no redness  ENT: oral mucosa moist without lesions, no cervical  or supraclavicular lymphadenopathy  CV: RRR without murmur, S1/S2, no JVD,, no peripheral edema  Resp: Globally decreased breath sounds, no wheezing or respiratory distress, normal RR and effort noted  GI: soft, mild bandlike lower tenderness, with active bowel sounds. No guarding or palpable organomegaly noted  Skin; warm and dry, no rash or jaundice noted.  Pale  Neuro: awake, alert and oriented x 3. Normal gross motor function and fluent speech.  Labs:  CBC Latest Ref Rng & Units 08/03/2019 08/03/2019 06/25/2019  WBC 4.0 - 10.5 K/uL - 21.0(H) 12.2(H)  Hemoglobin 12.0 - 15.0 g/dL 7.3(L) 10.3(L) 12.4  Hematocrit 36.0 - 46.0 % 22.6(L) 30.8(L) 37.9  Platelets 150 - 400 K/uL - 249 360    CMP Latest Ref Rng & Units 08/03/2019 06/25/2019 06/18/2018  Glucose 70 - 99 mg/dL 148(H) 102(H) 168(H)  BUN 6 - 20 mg/dL 58(H) 12 42(H)  Creatinine 0.44 - 1.00 mg/dL 1.55(H) 0.82 1.41(H)  Sodium 135 - 145 mmol/L 141 142 142  Potassium 3.5 - 5.1 mmol/L 2.1(LL) 3.6 4.6  Chloride 98 - 111 mmol/L 94(L) 96 101  CO2 22 - 32 mmol/L 32 32(H) 30  Calcium 8.9 - 10.3 mg/dL 8.9 9.0 9.1  Total Protein 6.5 - 8.1 g/dL 5.5(L) - -  Total Bilirubin 0.3 - 1.2 mg/dL 0.9 - -  Alkaline Phos 38 - 126 U/L 51 - -  AST 15 - 41 U/L 20 - -  ALT 0 - 44 U/L 16 - -    Recent Labs  Lab 08/03/19 1725  INR 0.9    Rapid influenza a and B and Covid test negative  Radiologic studies: Chest x-ray shows chronic changes of emphysema and possible pulmonary hypertension.  2 EKGs done in ED were personally reviewed.   CT abdomen done in ED last evening CLINICAL DATA:  Abdominal pain and black tarry stools.   EXAM: CT ABDOMEN AND PELVIS WITH CONTRAST   TECHNIQUE: Multidetector CT imaging of the abdomen and pelvis was performed using the standard protocol following bolus administration of intravenous contrast.   CONTRAST:  35mL OMNIPAQUE IOHEXOL 350 MG/ML SOLN   COMPARISON:  06/03/2018   FINDINGS: Lower chest: The lung bases are clear of acute process. No pleural effusion or pulmonary lesions. The heart is normal in size. No pericardial effusion. The distal esophagus and aorta are unremarkable.   Hepatobiliary: No focal hepatic lesions or intrahepatic biliary dilatation. The gallbladder is surgically absent. No common bile duct dilatation.   Pancreas: No mass, inflammation or ductal dilatation.   Spleen: Normal size. No focal lesions.   Adrenals/Urinary Tract: Stable left adrenal gland nodules, likely benign adenomas. The right adrenal gland is unremarkable. Both kidneys are unremarkable.   Stomach/Bowel: The stomach, duodenum, small bowel and colon are unremarkable. No acute inflammatory changes, mass lesions or obstructive findings. The terminal ileum appears normal. The appendix is surgically absent.   There is a moderate amount of fluid throughout the colon with scattered air-fluid levels typically seen with diarrhea. I do not see any wall thickening or pericolonic inflammatory changes.   Vascular/Lymphatic: Moderate atherosclerotic calcifications involving the aorta and iliac arteries but no aneurysm or dissection. The branch vessels are patent. The major venous structures are patent.   No mesenteric or retroperitoneal mass or adenopathy.   Reproductive: Surgically  absent.   Other: No pelvic mass or adenopathy. No free pelvic fluid collections. No inguinal mass or adenopathy. No abdominal wall hernia or subcutaneous lesions.   Musculoskeletal: Scoliosis and degenerative lumbar spondylosis with  multilevel disc disease and facet disease. No acute bony findings or bone lesions.   IMPRESSION: 1. Moderate fluid and scattered air-fluid levels throughout the colon typically seen with diarrhea. No colonic wall thickening or pericolonic inflammatory changes. 2. No other acute abdominal/pelvic findings and no mass lesions or adenopathy. 3. Status post cholecystectomy. No biliary dilatation. 4. Stable left adrenal gland nodules, likely benign adenomas.     Electronically Signed   By: Rudie Meyer M.D.   On: 08/03/2019 19:43  (CT scan images personally reviewed)  @[email or web address] @ Impression:  Melena Acute blood loss anemia. Underlying coronary artery disease COPD with ongoing tobacco use Acute kidney injury, volume depletion from GI bleeding.  Blood in the upper GI tract also accounts for the relative increase in BUN. Marked hypokalemia.  Suspected upper GI bleed such as gastric or duodenal ulcer related to NSAID use exacerbated by dual antiplatelet therapy. Suspect the appearance of bowel on CT scan is related to blood in the GI tract.  No obvious evidence of bowel ischemia on the scan, though there is neither oral nor IV contrast.  In addition, she had demand cardiac ischemia from acute blood loss.  Plan:  She is receiving volume resuscitation, her vital signs of normalized, but she still appears to have ongoing GI bleeding with passage of melena several hours ago.  Her hemoglobin went down considerably over the course of the day.  She is scheduled for total 2 units PRBC transfusion.  Aggressive potassium repletion and a recheck of that level is necessary.  She will need upper endoscopy later today.  Timing of this pending  administration of blood products, improvement of potassium level, evaluation by anesthesia and possible need for repeat EKG.  Troponin trending down and resolution of chest pain is encouraging.  Upper endoscopy was discussed in detail.  She was agreeable after discussion of procedure and risks.  The benefits and risks of the planned procedure were described in detail with the patient or (when appropriate) their health care proxy.  Risks were outlined as including, but not limited to, bleeding, infection, perforation, adverse medication reaction leading to cardiac or pulmonary decompensation, pancreatitis (if ERCP).  The limitation of incomplete mucosal visualization was also discussed.  No guarantees or warranties were given.  Patient at increased risk for cardiopulmonary complications of procedure due to medical comorbidities.   It will be done later today by one of my associates with [facility] who is on the hospital service.  Thank you for the courtesy of this consult.  Please contact me with any questions or concerns.  III Office: [phone]

## 2019-08-04 NOTE — Progress Notes (Signed)
PROGRESS NOTE    Bethany MosherJulie Stuart  ZOX:[id number] DOB: 11/15/1965 DOA: 08/03/2019 PCP: Eloisa NorthernAmin, Saad, MD  Brief Narrative:Bethany Stuart is a 54 y.o. female with medical history significant for COPD, hypertension, depression, nonobstructive coronary disease presented to the emergency room 1/20 6 PM with chest pain, abdominal pain and black tarry stools. -On aspirin and Plavix at baseline, also history of NSAID use -In the emergency room, noted to be mildly tachycardic, EKG noted ST depressions and prolonged QTC, chest x-ray noted chronic emphysematous changes, hemoglobin was 10.3 down from 12.4 last month, lactic acid was mildly elevated and troponin I was 155  Assessment & Plan:  Acute upper GI bleeding; Acute blood loss anemia - Presented with upper abdominal and chest pain, melena and 2 g drop in hemoglobin from baseline, positive FOBT  -On aspirin Plavix at baseline, also taking NSAIDs recently  -Concern for peptic ulcer disease  -Transfused 1 unit of PRBC  -Gastroenterology consulting , replace potassium  -Plan for EGD today  -Continue IV PPI, monitor hemoglobin   Chest pain; elevated troponin  -Likely secondary to demand ischemia from anemia, GI bleed -High-sensitivity troponin peaked at 182, EKG with ST depressions - She had LHC 06/29/19 with 40-60% mid RCA stenosis  -Cardiology following, follow-up echo  -Aspirin Plavix and beta-blocker on hold  Hypokalemia  - Serum potassium was 2.1 in the ED last night, replaced p.o. and IV, 2.9 this morning -Replace again, mag level is normal/high range -Replace oral potassium today, recheck labs this afternoon -Monitor on telemetry  Prolonged QT interval  - QTc is 527 ms in ED, likely secondary to severe hypokalemia  - Replace potassium, continue cardiac monitoring  COPD  Tobacco abuse - Stable, no wheezing   AKI  - SCr is 1.55 on admission, up from 0.88 last month  - Likely acute prerenal azotemia in setting of loose stools    -Creatinine improved, stop IV fluids, monitor hemoglobin and transfuse as needed   DVT prophylaxis: SCD's  Code Status: Full  Family Communication: Discussed with patient  Consults  Cardiology  Gastroenterology       Procedures:   Antimicrobials:    Subjective: -Feels weak, denies any chest pain this morning, had melena overnight  Objective: Vitals:   08/04/19 0530 08/04/19 0554 08/04/19 0828 08/04/19 0930  BP: 124/77 127/66  (!) 152/75  Pulse: 72 69  71  Resp: 16 (!) 24  18  Temp: 98.9 F (37.2 C) 98.4 F (36.9 C)  99.3 F (37.4 C)  TempSrc: Oral Oral  Oral  SpO2: 100% 100% 100% 100%  Weight:      Height:        Intake/Output Summary (Last 24 hours) at 08/04/2019 1148 Last data filed at 08/04/2019 0930 Gross per 24 hour  Intake 3393.04 ml  Output --  Net 3393.04 ml   Filed Weights   08/03/19 1518 08/04/19 0106  Weight: 88.9 kg 89.1 kg    Examination:  General exam: Obese pleasant female, sitting up in bed, ill-appearing, AAO x3 Respiratory system: Poor air movement bilaterally, otherwise clear Cardiovascular system: S1 & S2 heard, RRR.   Gastrointestinal system: Abdomen is nondistended, soft and nontender.Normal bowel sounds heard. Central nervous system: Alert and oriented. No focal neurological deficits. Extremities: No edema Skin: No rashes Psychiatry: Flat affect    Data Reviewed:   CBC: Recent Labs  Lab 08/03/19 1527 08/03/19 2355  WBC 21.0*  --   HGB 10.3* 7.3*  HCT 30.8* 22.6*  MCV 93.3  --  PLT 249  --    Basic Metabolic Panel: Recent Labs  Lab 08/03/19 1721 08/03/19 1725 08/04/19 0944  NA  --  141 142  K  --  2.1* 2.9*  CL  --  94* 108  CO2  --  32 27  GLUCOSE  --  148* 105*  BUN  --  58* 31*  CREATININE  --  1.55* 1.03*  CALCIUM  --  8.9 7.3*  MG 3.6*  --  2.9*   GFR: Estimated Creatinine Clearance: 71.6 mL/min (A) (by C-G formula based on SCr of 1.03 mg/dL (H)). Liver Function Tests: Recent Labs  Lab  08/03/19 1725  AST 20  ALT 16  ALKPHOS 51  BILITOT 0.9  PROT 5.5*  ALBUMIN 3.2*   No results for input(s): LIPASE, AMYLASE in the last 168 hours. No results for input(s): AMMONIA in the last 168 hours. Coagulation Profile: Recent Labs  Lab 08/03/19 1725  INR 0.9   Cardiac Enzymes: No results for input(s): CKTOTAL, CKMB, CKMBINDEX, TROPONINI in the last 168 hours. BNP (last 3 results) No results for input(s): PROBNP in the last 8760 hours. HbA1C: No results for input(s): HGBA1C in the last 72 hours. CBG: No results for input(s): GLUCAP in the last 168 hours. Lipid Profile: No results for input(s): CHOL, HDL, LDLCALC, TRIG, CHOLHDL, LDLDIRECT in the last 72 hours. Thyroid Function Tests: No results for input(s): TSH, T4TOTAL, FREET4, T3FREE, THYROIDAB in the last 72 hours. Anemia Panel: No results for input(s): VITAMINB12, FOLATE, FERRITIN, TIBC, IRON, RETICCTPCT in the last 72 hours. Urine analysis:    Component Value Date/Time   COLORURINE YELLOW 08/03/2019 1645   APPEARANCEUR HAZY (A) 08/03/2019 1645   LABSPEC 1.020 08/03/2019 1645   PHURINE 6.0 08/03/2019 1645   GLUCOSEU NEGATIVE 08/03/2019 1645   HGBUR MODERATE (A) 08/03/2019 1645   BILIRUBINUR NEGATIVE 08/03/2019 1645   KETONESUR NEGATIVE 08/03/2019 1645   PROTEINUR NEGATIVE 08/03/2019 1645   NITRITE NEGATIVE 08/03/2019 1645   LEUKOCYTESUR NEGATIVE 08/03/2019 1645   Sepsis Labs: @LABRCNTIP (procalcitonin:4,lacticidven:4)  ) Recent Results (from the past 240 hour(s))  Respiratory Panel by RT PCR (Flu A&B, Covid) - Urine, Clean Catch     Status: None   Collection Time: 08/03/19  5:02 PM   Specimen: Urine, Clean Catch  Result Value Ref Range Status   SARS Coronavirus 2 by RT PCR NEGATIVE NEGATIVE Final    Comment: (NOTE) SARS-CoV-2 target nucleic acids are NOT DETECTED. The SARS-CoV-2 RNA is generally detectable in upper respiratoy specimens during the acute phase of infection. The lowest concentration of  SARS-CoV-2 viral copies this assay can detect is 131 copies/mL. A negative result does not preclude SARS-Cov-2 infection and should not be used as the sole basis for treatment or other patient management decisions. A negative result may occur with  improper specimen collection/handling, submission of specimen other than nasopharyngeal swab, presence of viral mutation(s) within the areas targeted by this assay, and inadequate number of viral copies (<131 copies/mL). A negative result must be combined with clinical observations, patient history, and epidemiological information. The expected result is Negative. Fact Sheet for Patients:  PinkCheek.be Fact Sheet for Healthcare Providers:  GravelBags.it This test is not yet ap proved or cleared by the [place] FDA and  has been authorized for detection and/or diagnosis of SARS-CoV-2 by FDA under an Emergency Use Authorization (EUA). This EUA will remain  in effect (meaning this test can be used) for the duration of the COVID-19 declaration under Section 564(b)(1) of  the Act, 21 U.S.C. section 360bbb-3(b)(1), unless the authorization is terminated or revoked sooner.    Influenza A by PCR NEGATIVE NEGATIVE Final   Influenza B by PCR NEGATIVE NEGATIVE Final    Comment: (NOTE) The Xpert Xpress SARS-CoV-2/FLU/RSV assay is intended as an aid in  the diagnosis of influenza from Nasopharyngeal swab specimens and  should not be used as a sole basis for treatment. Nasal washings and  aspirates are unacceptable for Xpert Xpress SARS-CoV-2/FLU/RSV  testing. Fact Sheet for Patients: https://www.moore.com/ Fact Sheet for Healthcare Providers: https://www.young.biz/ This test is not yet approved or cleared by the [place] FDA and  has been authorized for detection and/or diagnosis of SARS-CoV-2 by  FDA under an Emergency Use Authorization (EUA).  This EUA will remain  in effect (meaning this test can be used) for the duration of the  Covid-19 declaration under Section 564(b)(1) of the Act, 21  U.S.C. section 360bbb-3(b)(1), unless the authorization is  terminated or revoked. Performed at [facility], [address] [place], [place] [address]   Blood Culture (routine x 2)     Status: None (Preliminary result)   Collection Time: 08/03/19  5:25 PM   Specimen: BLOOD  Result Value Ref Range Status   Specimen Description BLOOD SITE NOT SPECIFIED  Final   Special Requests   Final    BOTTLES DRAWN AEROBIC AND ANAEROBIC Blood Culture adequate volume   Culture   Final    NO GROWTH < 24 HOURS Performed at [facility], [address] [place], [place] [address]    Report Status PENDING  Incomplete  Blood Culture (routine x 2)     Status: None (Preliminary result)   Collection Time: 08/03/19  9:40 PM   Specimen: BLOOD  Result Value Ref Range Status   Specimen Description BLOOD RIGHT ANTECUBITAL  Final   Special Requests   Final    BOTTLES DRAWN AEROBIC AND ANAEROBIC Blood Culture adequate volume   Culture   Final    NO GROWTH < 24 HOURS Performed at [facility], [address] [place], [place] [address]    Report Status PENDING  Incomplete         Radiology Studies: CT Angio Chest PE W/Cm &/Or Wo Cm  Result Date: 08/03/2019 CLINICAL DATA:  Shortness of breath. EXAM: CT ANGIOGRAPHY CHEST WITH CONTRAST TECHNIQUE: Multidetector CT imaging of the chest was performed using the standard protocol during bolus administration of intravenous contrast. Multiplanar CT image reconstructions and MIPs were obtained to evaluate the vascular anatomy. CONTRAST:  41mL OMNIPAQUE IOHEXOL 350 MG/ML SOLN COMPARISON:  CT angiogram chest June 14, 2018 FINDINGS: Cardiovascular: There is no demonstrable pulmonary embolus. There is no thoracic aortic aneurysm or dissection. Visualized great vessels appear unremarkable. There are  foci of aortic atherosclerosis. There are foci coronary artery calcification. There is no appreciable pericardial effusion or pericardial thickening. Mediastinum/Nodes: Visualized thyroid appears normal. There are scattered subcentimeter mediastinal lymph nodes. There is no adenopathy by size criteria. No esophageal lesions are evident. Lungs/Pleura: There is underlying centrilobular emphysematous change. No edema or airspace opacity. No appreciable pleural effusions. Upper Abdomen: There is left adrenal hypertrophy, a finding also present previously. Visualized upper abdominal structures otherwise appear unremarkable. Musculoskeletal: There are no blastic or lytic bone lesions. No evident chest wall lesions. Review of the MIP images confirms the above findings. IMPRESSION: 1. No demonstrable pulmonary embolus. No thoracic aortic aneurysm or dissection. There is aortic atherosclerosis as well as foci  of coronary artery calcification. 2. Underlying centrilobular emphysema. No edema or airspace opacity. 3.  No evident adenopathy. 4. Left adrenal hypertrophy, a finding of uncertain etiology or significance. Aortic Atherosclerosis (ICD10-I70.0) and Emphysema (ICD10-J43.9). Electronically Signed   By: Bretta Bang III M.D.   On: 08/03/2019 19:43   CT ABDOMEN PELVIS W CONTRAST  Result Date: 08/03/2019 CLINICAL DATA:  Abdominal pain and black tarry stools. EXAM: CT ABDOMEN AND PELVIS WITH CONTRAST TECHNIQUE: Multidetector CT imaging of the abdomen and pelvis was performed using the standard protocol following bolus administration of intravenous contrast. CONTRAST:  12mL OMNIPAQUE IOHEXOL 350 MG/ML SOLN COMPARISON:  06/03/2018 FINDINGS: Lower chest: The lung bases are clear of acute process. No pleural effusion or pulmonary lesions. The heart is normal in size. No pericardial effusion. The distal esophagus and aorta are unremarkable. Hepatobiliary: No focal hepatic lesions or intrahepatic biliary dilatation. The  gallbladder is surgically absent. No common bile duct dilatation. Pancreas: No mass, inflammation or ductal dilatation. Spleen: Normal size. No focal lesions. Adrenals/Urinary Tract: Stable left adrenal gland nodules, likely benign adenomas. The right adrenal gland is unremarkable. Both kidneys are unremarkable. Stomach/Bowel: The stomach, duodenum, small bowel and colon are unremarkable. No acute inflammatory changes, mass lesions or obstructive findings. The terminal ileum appears normal. The appendix is surgically absent. There is a moderate amount of fluid throughout the colon with scattered air-fluid levels typically seen with diarrhea. I do not see any wall thickening or pericolonic inflammatory changes. Vascular/Lymphatic: Moderate atherosclerotic calcifications involving the aorta and iliac arteries but no aneurysm or dissection. The branch vessels are patent. The major venous structures are patent. No mesenteric or retroperitoneal mass or adenopathy. Reproductive: Surgically absent. Other: No pelvic mass or adenopathy. No free pelvic fluid collections. No inguinal mass or adenopathy. No abdominal wall hernia or subcutaneous lesions. Musculoskeletal: Scoliosis and degenerative lumbar spondylosis with multilevel disc disease and facet disease. No acute bony findings or bone lesions. IMPRESSION: 1. Moderate fluid and scattered air-fluid levels throughout the colon typically seen with diarrhea. No colonic wall thickening or pericolonic inflammatory changes. 2. No other acute abdominal/pelvic findings and no mass lesions or adenopathy. 3. Status post cholecystectomy. No biliary dilatation. 4. Stable left adrenal gland nodules, likely benign adenomas. Electronically Signed   By: Rudie Meyer M.D.   On: 08/03/2019 19:43   DG Chest Port 1 View  Result Date: 08/03/2019 CLINICAL DATA:  54 year old female right side chest pain. Bloody stools for 3 days. EXAM: PORTABLE CHEST 1 VIEW COMPARISON:  [facility]  portable chest 09/03/2018 and earlier. FINDINGS: Portable AP upright view at 1703 hours. Attenuated bronchovascular markings in the lungs in keeping with emphysema demonstrated by CT on 12/01/2018. Stable lung volumes. Mediastinal contours are stable with some central pulmonary artery enlargement. Visualized tracheal air column is within normal limits. Allowing for portable technique the lungs are clear. No acute osseous abnormality identified. IMPRESSION: 1.  No acute cardiopulmonary abnormality. 2. Emphysema (ICD10-J43.9). Chronic central pulmonary artery enlargement raising the possibility of pulmonary artery hypertension. Electronically Signed   By: Odessa Fleming M.D.   On: 08/03/2019 17:22        Scheduled Meds: . atorvastatin  40 mg Oral Daily  . fluticasone furoate-vilanterol  1 puff Inhalation Daily   And  . umeclidinium bromide  1 puff Inhalation Daily  . potassium chloride  40 mEq Oral Once  . potassium chloride  40 mEq Oral BID  . sodium chloride flush  3 mL Intravenous Q12H  . traZODone  100  mg Oral QHS   Continuous Infusions: . sodium chloride 20 mL/hr at 08/04/19 1047  . pantoprozole (PROTONIX) infusion 8 mg/hr (08/04/19 0801)     LOS: 1 day    Time spent:  Zannie Cove, MD [facility] Page via [email or web address], password [email or web address] After 7PM please contact night-coverage  08/04/2019, 11:48 AM

## 2019-08-04 NOTE — Progress Notes (Signed)
Daily Rounding Note  08/04/2019, 8:22 AM  LOS: 1 day   SUBJECTIVE:   Chief complaint:      Some tachypnea.  No tachycardia, no hypotension.  O2 sats 100% on 2 L [place] oxygen. Protonix drip in place.thinks her last dose Plavix was on 1/24.  Was not able to keep her meds down after that. Patient's GI symptoms started on Sunday with black stools, brown emesis.  Eventually became weak and dizzy.  Having exertional chest pressure and dyspnea. Patient feels better and is actually hungry. 1 PRBC just finished transfusing.    OBJECTIVE:         Vital signs in last 24 hours:    Temp:  [98.4 F (36.9 C)-99.5 F (37.5 C)] 98.4 F (36.9 C) (01/27 0554) Pulse Rate:  [69-108] 69 (01/27 0554) Resp:  [15-25] 24 (01/27 0554) BP: (103-168)/(59-98) 127/66 (01/27 0554) SpO2:  [96 %-100 %] 100 % (01/27 0554) Weight:  [88.9 kg-89.1 kg] 89.1 kg (01/27 0106) Last BM Date: 08/04/19 Filed Weights   08/03/19 1518 08/04/19 0106  Weight: 88.9 kg 89.1 kg   General: Pale.  Looks unwell.  Alert.  Comfortable. Heart: RRR. Chest: Clear bilaterally.  No dyspnea. Abdomen: Soft.  Not tender or distended.  Active bowel sounds. Extremities: No CCE. Neuro/Psych: Fully alert and oriented.  Good historian.  No tremors.  No gross weakness or deficits.  Intake/Output from previous day: 01/26 0701 - 01/27 0700 In: 3078 [I.V.:378; IV Piggyback:2700] Out: -   Intake/Output this shift: No intake/output data recorded.  Lab Results: Recent Labs    08/03/19 1527 08/03/19 2355  WBC 21.0*  --   HGB 10.3* 7.3*  HCT 30.8* 22.6*  PLT 249  --    BMET Recent Labs    08/03/19 1725  NA 141  K 2.1*  CL 94*  CO2 32  GLUCOSE 148*  BUN 58*  CREATININE 1.55*  CALCIUM 8.9   LFT Recent Labs    08/03/19 1725  PROT 5.5*  ALBUMIN 3.2*  AST 20  ALT 16  ALKPHOS 51  BILITOT 0.9   PT/INR Recent Labs    08/03/19 1725  LABPROT 12.5  INR 0.9    Hepatitis Panel No results for input(s): HEPBSAG, HCVAB, HEPAIGM, HEPBIGM in the last 72 hours.  Studies/Results: CT Angio Chest PE W/Cm &/Or Wo Cm  Result Date: 08/03/2019 CLINICAL DATA:  Shortness of breath. EXAM: CT ANGIOGRAPHY CHEST WITH CONTRAST TECHNIQUE: Multidetector CT imaging of the chest was performed using the standard protocol during bolus administration of intravenous contrast. Multiplanar CT image reconstructions and MIPs were obtained to evaluate the vascular anatomy. CONTRAST:  71mL OMNIPAQUE IOHEXOL 350 MG/ML SOLN COMPARISON:  CT angiogram chest June 14, 2018 FINDINGS: Cardiovascular: There is no demonstrable pulmonary embolus. There is no thoracic aortic aneurysm or dissection. Visualized great vessels appear unremarkable. There are foci of aortic atherosclerosis. There are foci coronary artery calcification. There is no appreciable pericardial effusion or pericardial thickening. Mediastinum/Nodes: Visualized thyroid appears normal. There are scattered subcentimeter mediastinal lymph nodes. There is no adenopathy by size criteria. No esophageal lesions are evident. Lungs/Pleura: There is underlying centrilobular emphysematous change. No edema or airspace opacity. No appreciable pleural effusions. Upper Abdomen: There is left adrenal hypertrophy, a finding also present previously. Visualized upper abdominal structures otherwise appear unremarkable. Musculoskeletal: There are no blastic or lytic bone lesions. No evident chest wall lesions. Review of the MIP images confirms the above findings. IMPRESSION: 1. No  demonstrable pulmonary embolus. No thoracic aortic aneurysm or dissection. There is aortic atherosclerosis as well as foci of coronary artery calcification. 2. Underlying centrilobular emphysema. No edema or airspace opacity. 3.  No evident adenopathy. 4. Left adrenal hypertrophy, a finding of uncertain etiology or significance. Aortic Atherosclerosis (ICD10-I70.0) and Emphysema  (ICD10-J43.9). Electronically Signed   By: Bretta Bang III M.D.   On: 08/03/2019 19:43   CT ABDOMEN PELVIS W CONTRAST  Result Date: 08/03/2019 CLINICAL DATA:  Abdominal pain and black tarry stools. EXAM: CT ABDOMEN AND PELVIS WITH CONTRAST TECHNIQUE: Multidetector CT imaging of the abdomen and pelvis was performed using the standard protocol following bolus administration of intravenous contrast. CONTRAST:  85mL OMNIPAQUE IOHEXOL 350 MG/ML SOLN COMPARISON:  06/03/2018 FINDINGS: Lower chest: The lung bases are clear of acute process. No pleural effusion or pulmonary lesions. The heart is normal in size. No pericardial effusion. The distal esophagus and aorta are unremarkable. Hepatobiliary: No focal hepatic lesions or intrahepatic biliary dilatation. The gallbladder is surgically absent. No common bile duct dilatation. Pancreas: No mass, inflammation or ductal dilatation. Spleen: Normal size. No focal lesions. Adrenals/Urinary Tract: Stable left adrenal gland nodules, likely benign adenomas. The right adrenal gland is unremarkable. Both kidneys are unremarkable. Stomach/Bowel: The stomach, duodenum, small bowel and colon are unremarkable. No acute inflammatory changes, mass lesions or obstructive findings. The terminal ileum appears normal. The appendix is surgically absent. There is a moderate amount of fluid throughout the colon with scattered air-fluid levels typically seen with diarrhea. I do not see any wall thickening or pericolonic inflammatory changes. Vascular/Lymphatic: Moderate atherosclerotic calcifications involving the aorta and iliac arteries but no aneurysm or dissection. The branch vessels are patent. The major venous structures are patent. No mesenteric or retroperitoneal mass or adenopathy. Reproductive: Surgically absent. Other: No pelvic mass or adenopathy. No free pelvic fluid collections. No inguinal mass or adenopathy. No abdominal wall hernia or subcutaneous lesions.  Musculoskeletal: Scoliosis and degenerative lumbar spondylosis with multilevel disc disease and facet disease. No acute bony findings or bone lesions. IMPRESSION: 1. Moderate fluid and scattered air-fluid levels throughout the colon typically seen with diarrhea. No colonic wall thickening or pericolonic inflammatory changes. 2. No other acute abdominal/pelvic findings and no mass lesions or adenopathy. 3. Status post cholecystectomy. No biliary dilatation. 4. Stable left adrenal gland nodules, likely benign adenomas. Electronically Signed   By: Rudie Meyer M.D.   On: 08/03/2019 19:43   DG Chest Port 1 View  Result Date: 08/03/2019 CLINICAL DATA:  54 year old female right side chest pain. Bloody stools for 3 days. EXAM: PORTABLE CHEST 1 VIEW COMPARISON:  [facility] portable chest 09/03/2018 and earlier. FINDINGS: Portable AP upright view at 1703 hours. Attenuated bronchovascular markings in the lungs in keeping with emphysema demonstrated by CT on 12/01/2018. Stable lung volumes. Mediastinal contours are stable with some central pulmonary artery enlargement. Visualized tracheal air column is within normal limits. Allowing for portable technique the lungs are clear. No acute osseous abnormality identified. IMPRESSION: 1.  No acute cardiopulmonary abnormality. 2. Emphysema (ICD10-J43.9). Chronic central pulmonary artery enlargement raising the possibility of pulmonary artery hypertension. Electronically Signed   By: Odessa Fleming M.D.   On: 08/03/2019 17:22    ASSESMENT:   *   `GI bleed.  Melena. CTAP with scattered air-fluid levels throughout colon consistent with diarrhea.  *   ASA, Plavix PTA.  Last plavix ????  *    Blood loss anemia.  Hb 10.3 >> 7.3.  Was 12.4 in the mid  December 2020. Just completed 1/1 PRBCs.  *   Demand ischemia.  Troponins to 182, diffuse ischemic EKG changes 06/2018 non-STEMI.  Cardiac cath at that time with nonobstructive CAD.  No intervention.  Suggestion was DOAC for 1  year.  Cardiac cath 06/29/2027 with 40 to 55% mid RCA disease, LV EF greater than 70%.  At follow-up cardiac office visit 07/08/2019 she was continued on DOAC.  *    Hypokalemia to 2.1.  No recheck yet following multiple runs potassium  *     AKI.  Renal function normal in mid 06/2019.  *     COVID-19 negative.  *     COPD.  Multiple hospital visits for COPD exacerbations.   PLAN   *    EGD, question timing>> it is currently scheduled for 2 PM today pending resolution of hypokalemia.  *  Phlebotomy will collect the med now and hold off on CBC which was ordered for 2 hours after completion of transfusion  *    Reassess risk/benefit ratio for continuing Plavix.    Azucena Freed  08/04/2019, 8:22 AM Phone [phone]

## 2019-08-04 NOTE — Transfer of Care (Signed)
Immediate Anesthesia Transfer of Care Note  Patient: Bethany Stuart  Procedure(s) Performed: ESOPHAGOGASTRODUODENOSCOPY (EGD) (N/A ) BIOPSY  Patient Location: PACU and Endoscopy Unit  Anesthesia Type:MAC  Level of Consciousness: awake, oriented and patient cooperative  Airway & Oxygen Therapy: Patient Spontanous Breathing  Post-op Assessment: Report given to RN and Post -op Vital signs reviewed and stable  Post vital signs: Reviewed and stable  Last Vitals:  Vitals Value Taken Time  BP 115/86 08/04/19 1434  Temp    Pulse 71 08/04/19 1435  Resp 18 08/04/19 1435  SpO2 96 % 08/04/19 1435  Vitals shown include unvalidated device data.  Last Pain:  Vitals:   08/04/19 1303  TempSrc: Oral  PainSc: 0-No pain         Complications: No apparent anesthesia complications

## 2019-08-04 NOTE — Progress Notes (Addendum)
Received call from GI asking for an order from hospitalist to transfuse PRBC as soon as possible. Provider notified, awaiting orders.

## 2019-08-04 NOTE — Anesthesia Preprocedure Evaluation (Signed)
Anesthesia Evaluation  Patient identified by MRN, date of birth, ID band Patient awake    Reviewed: Allergy & Precautions, NPO status , Patient's Chart, lab work & pertinent test results  History of Anesthesia Complications Negative for: history of anesthetic complications  Airway Mallampati: III  TM Distance: >3 FB Neck ROM: Full    Dental  (+) Dental Advisory Given, Upper Dentures,    Pulmonary neg shortness of breath, COPD, neg recent URI, Current Smoker and Patient abstained from smoking.,    breath sounds clear to auscultation       Cardiovascular hypertension, Pt. on medications (-) angina+ CAD  (-) Past MI and (-) CHF  Rhythm:Regular  She had LHC 06/29/19 with 40-60% mid RCA stenosis    Neuro/Psych PSYCHIATRIC DISORDERS Depression  Neuromuscular disease    GI/Hepatic PUD,   Endo/Other  negative endocrine ROS  Renal/GU Renal InsufficiencyRenal disease     Musculoskeletal  (+) Arthritis ,   Abdominal   Peds  Hematology  (+) Blood dyscrasia, anemia ,   Anesthesia Other Findings   Reproductive/Obstetrics                             Anesthesia Physical Anesthesia Plan  ASA: III  Anesthesia Plan: MAC   Post-op Pain Management:    Induction: Intravenous  PONV Risk Score and Plan: 1 and Treatment may vary due to age or medical condition and Propofol infusion  Airway Management Planned: Nasal Cannula  Additional Equipment: None  Intra-op Plan:   Post-operative Plan:   Informed Consent: I have reviewed the patients History and Physical, chart, labs and discussed the procedure including the risks, benefits and alternatives for the proposed anesthesia with the patient or authorized representative who has indicated his/her understanding and acceptance.     Dental advisory given  Plan Discussed with: Surgeon  Anesthesia Plan Comments:         Anesthesia Quick  Evaluation

## 2019-08-04 NOTE — Op Note (Addendum)
[facility] Patient Name: Bethany Stuart Procedure Date : 08/04/2019 MRN: [id number] Attending MD: Thornton Park MD, MD Date of Birth: 10-Apr-1966 CSN: [id number] Age: 54 Admit Type: Inpatient Procedure:                Upper GI endoscopy Indications:              Melena, Suspected upper gastrointestinal bleeding Providers:                Thornton Park MD, MD, Glori Bickers, RN, Lina Sar, Technician, Corie Chiquito, Technician, Luane School, CRNA Referring MD:              Medicines:                Monitored Anesthesia Care Complications:            No immediate complications. Estimated blood loss:                            Minimal. Estimated Blood Loss:     Estimated blood loss was minimal. Procedure:                Pre-Anesthesia Assessment:                           - Prior to the procedure, a History and Physical                            was performed, and patient medications and                            allergies were reviewed. The patient's tolerance of                            previous anesthesia was also reviewed. The risks                            and benefits of the procedure and the sedation                            options and risks were discussed with the patient.                            All questions were answered, and informed consent                            was obtained. Prior Anticoagulants: The patient has                            taken Plavix (clopidogrel), last dose was 3 days  prior to procedure. ASA Grade Assessment: III - A                            patient with severe systemic disease. After                            reviewing the risks and benefits, the patient was                            deemed in satisfactory condition to undergo the                            procedure.                           After obtaining informed consent, the  endoscope was                            passed under direct vision. Throughout the                            procedure, the patient's blood pressure, pulse, and                            oxygen saturations were monitored continuously. The                            GIF-H190 ([id number]) Olympus gastroscope was                            introduced through the mouth, and advanced to the                            third part of duodenum. The upper GI endoscopy was                            accomplished without difficulty. The patient                            tolerated the procedure well. Scope In: Scope Out: Findings:      The examined esophagus was normal.      Six non-bleeding cratered gastric ulcers with no stigmata of bleeding       were found in the gastric antrum, in the prepyloric region of the       stomach and the two largest were in the the pyloric channel. There is       some narrowing to the pyloric channel but the gastroscope advances with       only minimal resistance. The largest lesion was 10 mm in largest       dimension. Biopsies were taken from the antrum, body, and fundus with a       cold forceps for histology. Estimated blood loss was minimal. No blood       or heme was present in the stomach.      Diffuse mildly erythematous mucosa without active bleeding  and with no       stigmata of bleeding was found in the duodenal bulb. Biopsies were taken       from the bulb, 1st portion, and 2nd portion of the duodenum with a cold       forceps for histology. No blood or heme was present in the stomach.      The cardia and gastric fundus were normal on retroflexion.      The exam was otherwise without abnormality. Impression:               - Normal esophagus.                           - Non-bleeding gastric ulcers with no stigmata of                            bleeding. Likely due to ibuprofen. Biopsied to                            exclude H pylori.                            - Erythematous duodenopathy. Biopsied.                           - The examination was otherwise normal. Recommendation:           - Clear liquid diet. Advance diet as hemoglobin                            stabilizes.                           - Resume Plavix (clopidogrel) at prior dose today.                           - No aspirin, ibuprofen, naproxen, or other                            non-steroidal anti-inflammatory drugs.                           - Pantoprazole 40 mg BID x 8 weeks.                           - Await pathology results.                           - Avoid all NSAIDs as you are able. Stop using                            ibuprofen. Consider alternatives for long-term                            management of back pain.                           - Repeat upper endoscopy  in 3 months to check                            healing of the ulcers. Procedure Code(s):        --- Professional ---                           [phone], Esophagogastroduodenoscopy, flexible,                            transoral; with biopsy, single or multiple Diagnosis Code(s):        --- Professional ---                           K25.9, Gastric ulcer, unspecified as acute or                            chronic, without hemorrhage or perforation                           K31.89, Other diseases of stomach and duodenum                           K92.1, Melena (includes Hematochezia) CPT copyright 2019 [employer]. All rights reserved. The codes documented in this report are preliminary and upon coder review may  be revised to meet current compliance requirements. Tressia Danas MD, MD 08/04/2019 2:47:45 PM This report has been signed electronically. Number of Addenda: 0

## 2019-08-04 NOTE — Progress Notes (Signed)
Repeat potassium level's morning is 2.9.  This is improved but she is still hypokalemic. She received 40  mEq of oral potassium since that measurement I am ordering another 40 meq po now  Stat potassium level and Hg/crit at 1300 today  Jennye Moccasin PA-C

## 2019-08-04 NOTE — Progress Notes (Addendum)
Progress Note  Patient Name: Bethany Stuart Date of Encounter: 08/04/2019  Primary Cardiologist: Berniece Salines, DO   Subjective   Patient states she hurts all over this morning. She states she felt like she "got hit by a mack truck" when she got up this morning. No specific chest pain though but she states she has not ambulated yet today which is when it usually occurs. No shortness of breath at rest. She had another episodes of melena last night. Plan is for EGD later today.   Inpatient Medications    Scheduled Meds: . atorvastatin  40 mg Oral Daily  . fluticasone furoate-vilanterol  1 puff Inhalation Daily   And  . umeclidinium bromide  1 puff Inhalation Daily  . potassium chloride  40 mEq Oral BID  . sodium chloride flush  3 mL Intravenous Q12H  . traZODone  100 mg Oral QHS   Continuous Infusions: . pantoprozole (PROTONIX) infusion 8 mg/hr (08/04/19 0801)   PRN Meds: acetaminophen **OR** acetaminophen, ALPRAZolam, ipratropium-albuterol   Vital Signs    Vitals:   08/04/19 0106 08/04/19 0530 08/04/19 0554 08/04/19 0828  BP: 133/82 124/77 127/66   Pulse: 93 72 69   Resp: (!) 24 16 (!) 24   Temp: 99.4 F (37.4 C) 98.9 F (37.2 C) 98.4 F (36.9 C)   TempSrc: Oral Oral Oral   SpO2: 100% 100% 100% 100%  Weight: 89.1 kg     Height: 5\' 7"  (1.702 m)       Intake/Output Summary (Last 24 hours) at 08/04/2019 0921 Last data filed at 08/04/2019 0400 Gross per 24 hour  Intake 3078.04 ml  Output --  Net 3078.04 ml   Last 3 Weights 08/04/2019 08/03/2019 07/08/2019  Weight (lbs) 196 lb 8 oz 196 lb 196 lb  Weight (kg) 89.132 kg 88.905 kg 88.905 kg      Telemetry    Normal sinus rhythm with rates in the 60's to 80's. - Personally Reviewed  ECG    No new ECG tracing today. - Personally Reviewed  Physical Exam   GEN: No acute distress.   Neck: Supple. No JVD Cardiac: RRR. No murmurs, rubs, or gallops. Radial pulses 2+ and equal bilaterally. Respiratory: No increased work  of breathing. Decreased breath sounds bilaterally with a few scattered wheezes. No crackles.  GI: Soft, non-distended, and non-tender. MS: No lower extremity edema. No deformity. Skin: Warm and dry. Neuro:  No focal deficits. Psych: Normal affect.  Labs    High Sensitivity Troponin:   Recent Labs  Lab 08/03/19 1725 08/03/19 2115 08/03/19 2355  TROPONINIHS 155* 182* 134*      Chemistry Recent Labs  Lab 08/03/19 1725  NA 141  K 2.1*  CL 94*  CO2 32  GLUCOSE 148*  BUN 58*  CREATININE 1.55*  CALCIUM 8.9  PROT 5.5*  ALBUMIN 3.2*  AST 20  ALT 16  ALKPHOS 51  BILITOT 0.9  GFRNONAA 38*  GFRAA 44*  ANIONGAP 15     Hematology Recent Labs  Lab 08/03/19 1527 08/03/19 2355  WBC 21.0*  --   RBC 3.30*  --   HGB 10.3* 7.3*  HCT 30.8* 22.6*  MCV 93.3  --   MCH 31.2  --   MCHC 33.4  --   RDW 16.1*  --   PLT 249  --     BNPNo results for input(s): BNP, PROBNP in the last 168 hours.   DDimer No results for input(s): DDIMER in the last 168 hours.  Radiology    CT Angio Chest PE W/Cm &/Or Wo Cm  Result Date: 08/03/2019 CLINICAL DATA:  Shortness of breath. EXAM: CT ANGIOGRAPHY CHEST WITH CONTRAST TECHNIQUE: Multidetector CT imaging of the chest was performed using the standard protocol during bolus administration of intravenous contrast. Multiplanar CT image reconstructions and MIPs were obtained to evaluate the vascular anatomy. CONTRAST:  46mL OMNIPAQUE IOHEXOL 350 MG/ML SOLN COMPARISON:  CT angiogram chest June 14, 2018 FINDINGS: Cardiovascular: There is no demonstrable pulmonary embolus. There is no thoracic aortic aneurysm or dissection. Visualized great vessels appear unremarkable. There are foci of aortic atherosclerosis. There are foci coronary artery calcification. There is no appreciable pericardial effusion or pericardial thickening. Mediastinum/Nodes: Visualized thyroid appears normal. There are scattered subcentimeter mediastinal lymph nodes. There is no  adenopathy by size criteria. No esophageal lesions are evident. Lungs/Pleura: There is underlying centrilobular emphysematous change. No edema or airspace opacity. No appreciable pleural effusions. Upper Abdomen: There is left adrenal hypertrophy, a finding also present previously. Visualized upper abdominal structures otherwise appear unremarkable. Musculoskeletal: There are no blastic or lytic bone lesions. No evident chest wall lesions. Review of the MIP images confirms the above findings. IMPRESSION: 1. No demonstrable pulmonary embolus. No thoracic aortic aneurysm or dissection. There is aortic atherosclerosis as well as foci of coronary artery calcification. 2. Underlying centrilobular emphysema. No edema or airspace opacity. 3.  No evident adenopathy. 4. Left adrenal hypertrophy, a finding of uncertain etiology or significance. Aortic Atherosclerosis (ICD10-I70.0) and Emphysema (ICD10-J43.9). Electronically Signed   By: Bretta Bang III M.D.   On: 08/03/2019 19:43   CT ABDOMEN PELVIS W CONTRAST  Result Date: 08/03/2019 CLINICAL DATA:  Abdominal pain and black tarry stools. EXAM: CT ABDOMEN AND PELVIS WITH CONTRAST TECHNIQUE: Multidetector CT imaging of the abdomen and pelvis was performed using the standard protocol following bolus administration of intravenous contrast. CONTRAST:  61mL OMNIPAQUE IOHEXOL 350 MG/ML SOLN COMPARISON:  06/03/2018 FINDINGS: Lower chest: The lung bases are clear of acute process. No pleural effusion or pulmonary lesions. The heart is normal in size. No pericardial effusion. The distal esophagus and aorta are unremarkable. Hepatobiliary: No focal hepatic lesions or intrahepatic biliary dilatation. The gallbladder is surgically absent. No common bile duct dilatation. Pancreas: No mass, inflammation or ductal dilatation. Spleen: Normal size. No focal lesions. Adrenals/Urinary Tract: Stable left adrenal gland nodules, likely benign adenomas. The right adrenal gland is  unremarkable. Both kidneys are unremarkable. Stomach/Bowel: The stomach, duodenum, small bowel and colon are unremarkable. No acute inflammatory changes, mass lesions or obstructive findings. The terminal ileum appears normal. The appendix is surgically absent. There is a moderate amount of fluid throughout the colon with scattered air-fluid levels typically seen with diarrhea. I do not see any wall thickening or pericolonic inflammatory changes. Vascular/Lymphatic: Moderate atherosclerotic calcifications involving the aorta and iliac arteries but no aneurysm or dissection. The branch vessels are patent. The major venous structures are patent. No mesenteric or retroperitoneal mass or adenopathy. Reproductive: Surgically absent. Other: No pelvic mass or adenopathy. No free pelvic fluid collections. No inguinal mass or adenopathy. No abdominal wall hernia or subcutaneous lesions. Musculoskeletal: Scoliosis and degenerative lumbar spondylosis with multilevel disc disease and facet disease. No acute bony findings or bone lesions. IMPRESSION: 1. Moderate fluid and scattered air-fluid levels throughout the colon typically seen with diarrhea. No colonic wall thickening or pericolonic inflammatory changes. 2. No other acute abdominal/pelvic findings and no mass lesions or adenopathy. 3. Status post cholecystectomy. No biliary dilatation. 4. Stable left adrenal gland  nodules, likely benign adenomas. Electronically Signed   By: Rudie Meyer M.D.   On: 08/03/2019 19:43   DG Chest Port 1 View  Result Date: 08/03/2019 CLINICAL DATA:  54 year old female right side chest pain. Bloody stools for 3 days. EXAM: PORTABLE CHEST 1 VIEW COMPARISON:  [facility] portable chest 09/03/2018 and earlier. FINDINGS: Portable AP upright view at 1703 hours. Attenuated bronchovascular markings in the lungs in keeping with emphysema demonstrated by CT on 12/01/2018. Stable lung volumes. Mediastinal contours are stable with some central  pulmonary artery enlargement. Visualized tracheal air column is within normal limits. Allowing for portable technique the lungs are clear. No acute osseous abnormality identified. IMPRESSION: 1.  No acute cardiopulmonary abnormality. 2. Emphysema (ICD10-J43.9). Chronic central pulmonary artery enlargement raising the possibility of pulmonary artery hypertension. Electronically Signed   By: Odessa Fleming M.D.   On: 08/03/2019 17:22    Cardiac Studies   Left Heart Catheterization 06/29/2019:  Right dominant coronary anatomy.  40 to 60% mid RCA eccentric stenosis, unchanged/improved when compared to 1 year ago.  Normal left main  Widely patent LAD  Normal circumflex  Hyperdynamic left ventricle.  EF 65% or greater.  LVEDP 14 mmHg.  No intracavitary gradient recorded.  RECOMMENDATIONS:  Stable anatomy when compared to prior.  We encouraged risk factor modification, especially smoking cessation.  We encouraged compliance with antihypertensive regimen.  Patient Profile   Ms. Edmondson is a 54 y.o. 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 Cardiology asked to see for evaluation of chest pain and elevated troponin.   Assessment & Plan    Demand Ischemia - Patient presented with abdominal pain and frequent melena but also endorsed chest pain that was both pleuritic and exertional in nature.  - High-sensitivity troponin mildly elevated and flat at 155 > 182 >> 134.  - EKG shows diffuse ST depression which has been seen on prior EKGs. - Patient recently had cardiac catheterization on 06/29/2019 which showed 40-60% mid RCA stenosis (unchanged from cath in 2019).  - Will go ahead and check Echo (outpatient Echo scheduled for 2/15). - ACS  unlikely. Suspect demand ischemia in setting of anemia. 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  - Aspirin and Plavix currently on hold due to anemia. Scheduled for EGD later today. Restart when OK with GI. - Beta-blocker held due to concerns for hypovolemia.  - Continue statin.  Possible LVOT - During recent hospitalization at [facility] for sepsis secondary to UTI, Echo was reportedly concerning for LVOT but was a technically difficult study.  - Will go ahead and recheck Echo while here.   Hypertension -  BP initially soft but has since improved. Most recent BP 152/75. - Home Coreg, Diltiazem, and HCTZ held on admission due to soft BP and concern for hypovolemia.  - Now that BP has improved, could consider gradually adding back Coreg or Diltiazem following EGD today. Continue to hold HCTZ for now given renal function.   Anemia with Suspected Acute Upper GI Bleed - Hemoglobin 10.3 on admission, down from 12.4 on 06/25/2019. Received 1 unit of PRBC. - GI consulted and plan is for EGD later today.  - Management per GI.  Hypokalemia - Potassium 2.1 on admission.  - Being repleted.  - Morning  BMET pending.  Prolonged QTc  - QTc 501 ms on EKG yesterday - Potassium 2.1. Being repleted. Repeat BMET pending. - Magnesium 3.6 yesterday.  Repeat pending. - Recheck EKG once potassium and magnesium in normal range.  AKI  - Creatinine 1.55 on admission. Up form 0.82 in 06/2019. - Likely pre-renal due to hypovolemia. - Received IV fluids yesterday. - Repeat BMET this morning pending.   Otherwise, per primary team.  - COPD - SIRS - Hypermagnesemia   For questions or updates, please contact [facility] Please consult [email or web address] for contact info under        Signed, Corrin Parker, PA-C  08/04/2019, 9:21 AM    Personally seen and examined. Agree with above.  Demand ischemia -In the setting of GI bleed, recent  catheterization with nonobstructive CAD. -No longer needs Plavix.  Would consider low-dose aspirin when appropriate in the future.  She was on dual antiplatelet therapy Plavix and aspirin secondary to prior non-STEMI that was medically managed with no PCI back in December 2019.  Plan was to continue this for 1 year.  At this point, if we resume an antiplatelet agent, she can continue with aspirin 81 mg monotherapy.  Awaiting EGD.  Melena.  Okay to proceed from cardiac perspective.  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  Avoid nitroglycerin  Donato Schultz, MD

## 2019-08-04 NOTE — Anesthesia Procedure Notes (Signed)
Procedure Name: MAC Date/Time: 08/04/2019 2:20 PM Performed by: Renato Shin, CRNA Pre-anesthesia Checklist: Patient identified, Emergency Drugs available, Suction available and Patient being monitored Patient Re-evaluated:Patient Re-evaluated prior to induction Oxygen Delivery Method: Nasal cannula Preoxygenation: Pre-oxygenation with 100% oxygen Induction Type: IV induction Placement Confirmation: positive ETCO2 and breath sounds checked- equal and bilateral Dental Injury: Teeth and Oropharynx as per pre-operative assessment

## 2019-08-05 ENCOUNTER — Encounter: Payer: Self-pay | Admitting: *Deleted

## 2019-08-05 ENCOUNTER — Telehealth: Payer: Self-pay | Admitting: *Deleted

## 2019-08-05 ENCOUNTER — Inpatient Hospital Stay (HOSPITAL_COMMUNITY): Payer: Medicaid Other

## 2019-08-05 DIAGNOSIS — R079 Chest pain, unspecified: Secondary | ICD-10-CM

## 2019-08-05 DIAGNOSIS — K254 Chronic or unspecified gastric ulcer with hemorrhage: Principal | ICD-10-CM

## 2019-08-05 LAB — CBC
HCT: 25.8 % — ABNORMAL LOW (ref 36.0–46.0)
Hemoglobin: 8.3 g/dL — ABNORMAL LOW (ref 12.0–15.0)
MCH: 30.4 pg (ref 26.0–34.0)
MCHC: 32.2 g/dL (ref 30.0–36.0)
MCV: 94.5 fL (ref 80.0–100.0)
Platelets: 99 10*3/uL — ABNORMAL LOW (ref 150–400)
RBC: 2.73 MIL/uL — ABNORMAL LOW (ref 3.87–5.11)
RDW: 17.8 % — ABNORMAL HIGH (ref 11.5–15.5)
WBC: 9 10*3/uL (ref 4.0–10.5)
nRBC: 0 % (ref 0.0–0.2)

## 2019-08-05 LAB — BPAM RBC
Blood Product Expiration Date: 202102242359
ISSUE DATE / TIME: 202101270510
Unit Type and Rh: 5100

## 2019-08-05 LAB — ECHOCARDIOGRAM COMPLETE
Height: 67 in
Weight: 3260.8 oz

## 2019-08-05 LAB — BASIC METABOLIC PANEL
Anion gap: 7 (ref 5–15)
BUN: 20 mg/dL (ref 6–20)
CO2: 24 mmol/L (ref 22–32)
Calcium: 8.2 mg/dL — ABNORMAL LOW (ref 8.9–10.3)
Chloride: 109 mmol/L (ref 98–111)
Creatinine, Ser: 0.86 mg/dL (ref 0.44–1.00)
GFR calc Af Amer: >60 mL/min (ref >60)
GFR calc non Af Amer: >60 mL/min (ref >60)
Glucose, Bld: 90 mg/dL (ref 70–99)
Potassium: 4.3 mmol/L (ref 3.5–5.1)
Sodium: 140 mmol/L (ref 135–145)

## 2019-08-05 LAB — TYPE AND SCREEN
ABO/RH(D): O POS
Antibody Screen: NEGATIVE
Unit division: 0

## 2019-08-05 LAB — URINE CULTURE: Culture: >=100000 — AB

## 2019-08-05 MED ORDER — CLOPIDOGREL BISULFATE 75 MG PO TABS
75.0000 mg | ORAL_TABLET | Freq: Every day | ORAL | Status: DC
  Administered 2019-08-05 – 2019-08-06 (×2): 75 mg via ORAL
  Filled 2019-08-05 (×2): qty 1

## 2019-08-05 MED ORDER — CLOPIDOGREL BISULFATE 75 MG PO TABS: 75.0000 mg | ORAL_TABLET | Freq: Every day | ORAL | Status: DC

## 2019-08-05 MED ORDER — OXYCODONE-ACETAMINOPHEN 5-325 MG PO TABS
1.0000 | ORAL_TABLET | ORAL | Status: DC | PRN
  Administered 2019-08-05 – 2019-08-06 (×7): 1 via ORAL
  Filled 2019-08-05 (×6): qty 1

## 2019-08-05 MED ORDER — CARVEDILOL 25 MG PO TABS
25.0000 mg | ORAL_TABLET | Freq: Two times a day (BID) | ORAL | Status: DC
  Administered 2019-08-05 – 2019-08-06 (×3): 25 mg via ORAL
  Filled 2019-08-05 (×3): qty 1

## 2019-08-05 NOTE — Telephone Encounter (Signed)
Staff message sent to myself to schedule patient for EGD in 3 months.

## 2019-08-05 NOTE — Progress Notes (Signed)
PROGRESS NOTE  Bethany Stuart ZDG:[id number] DOB: 10/16/1965 DOA: 08/03/2019 PCP: Garwin Brothers, MD   LOS: 2 days   Brief Narrative / Interim history: 54 year old female with COPD, HTN, depression, nonobstructive CAD came into the hospital on 1/26 with complaints of chest pain, abdominal pain, and black stools.  She takes aspirin and Plavix for her coronary artery disease, and also has been taking intermittently ibuprofen for chronic lower back pain.  She was found to have a mildly elevated troponin, lactic acid and hemoglobin was 10.3 down from 12.4 last month  Subjective / 24h Interval events: She is doing better this morning, wants to eat a little bit more consistent food  Assessment & Plan: Principal Problem Acute blood loss anemia due to acute upper GI bleeding -She presented with upper abdominal pain, melena, 2 g drop in hemoglobin from baseline positive fecal occult -Gastroenterology has been consulted, she underwent transfusion with a unit of packed red blood cells -Underwent an EGD on 1/27 with nonbleeding gastric ulcers with no stigmata of recent bleeding, likely due to ibuprofen.  Will biopsy to exclude H. pylori. -For now continue PPI twice daily x8 weeks  Active Problems Chest pain, elevated troponin, known CAD -Cardiology consulted and followed patient while hospitalized -This is likely secondary to demand ischemia from anemia, GI bleed -She recently had a left heart cath on 12/22 with 40-60% mid RCA stenosis, now continue medical management, she used to be on aspirin and Plavix.  Plavix can be resumed and per cardiology she will be on this for monotherapy  Hypokalemia -Replaced, now normalized  COPD, tobacco use, hypoxic respiratory failure -Patient was found to be on 3 L nasal cannula, not sure whether it was true hypoxia versus post EGD she remained on oxygen -Wean off to room air  Acute kidney injury -Serum creatinine 1.55 on admission up from 0.88 last month, likely  prerenal in the setting of loose stools, bleeding, anemia -Has received IV fluids and her creatinine has now normalized   Scheduled Meds: . atorvastatin  40 mg Oral Daily  . carvedilol  25 mg Oral BID  . fluticasone furoate-vilanterol  1 puff Inhalation Daily   And  . umeclidinium bromide  1 puff Inhalation Daily  . potassium chloride  40 mEq Oral BID  . sodium chloride flush  3 mL Intravenous Q12H  . traZODone  100 mg Oral QHS   Continuous Infusions: . pantoprozole (PROTONIX) infusion 8 mg/hr (08/04/19 0801)   PRN Meds:.acetaminophen **OR** acetaminophen, ALPRAZolam, fentaNYL (SUBLIMAZE) injection, ipratropium-albuterol, oxyCODONE-acetaminophen  DVT prophylaxis: SCDs Code Status: Full code Family Communication: d/w patient  Patient admitted from: home  Anticipated d/c place: home  Barriers to d/c: Anticipate home tomorrow if hemoglobin remains stable and she is tolerating a regular diet  Consultants:  Gastroenterology Cardiology  Procedures:  EGD 1/27:  Impression:               - Normal esophagus.                           - Non-bleeding gastric ulcers with no stigmata of                            bleeding. Likely due to ibuprofen. Biopsied to                            exclude H pylori.                           -  Erythematous duodenopathy. Biopsied.                           - The examination was otherwise normal. Recommendation:           - Clear liquid diet. Advance diet as hemoglobin                            stabilizes.                           - Resume Plavix (clopidogrel) at prior dose today.                           - No aspirin, ibuprofen, naproxen, or other                            non-steroidal anti-inflammatory drugs.                           - Pantoprazole 40 mg BID x 8 weeks.                           - Await pathology results.                           - Avoid all NSAIDs   Microbiology  None  Antimicrobials: None   Objective: Vitals:    08/05/19 0501 08/05/19 0503 08/05/19 0841 08/05/19 0937  BP: (!) 153/75   (!) 159/84  Pulse: 68  80 83  Resp: 18   20  Temp: 98.4 F (36.9 C)   98.3 F (36.8 C)  TempSrc: Oral   Oral  SpO2: 100%  100% 100%  Weight:  92.4 kg    Height:        Intake/Output Summary (Last 24 hours) at 08/05/2019 1128 Last data filed at 08/05/2019 6415 Gross per 24 hour  Intake 2100 ml  Output 100 ml  Net 2000 ml   Filed Weights   08/03/19 1518 08/04/19 0106 08/05/19 0503  Weight: 88.9 kg 89.1 kg 92.4 kg    Examination:  Constitutional: NAD Eyes: no scleral icterus ENMT: Mucous membranes are moist.  Neck: normal, supple Respiratory: clear to auscultation bilaterally, no wheezing, no crackles. Normal respiratory effort.  Cardiovascular: Regular rate and rhythm, no murmurs / rubs / gallops. No LE edema.  Abdomen: non distended, no tenderness. Bowel sounds positive.  Musculoskeletal: no clubbing / cyanosis.  Skin: no rashes Neurologic: CN 2-12 grossly intact. Strength 5/5 in all 4.  Psychiatric: Normal judgment and insight. Alert and oriented x 3. Normal mood.    Data Reviewed: I have independently reviewed following labs and imaging studies   CBC: Recent Labs  Lab 08/03/19 1527 08/03/19 2355 08/04/19 1250 08/05/19 0435  WBC 21.0*  --   --  9.0  HGB 10.3* 7.3* 9.3* 8.3*  HCT 30.8* 22.6* 28.9* 25.8*  MCV 93.3  --   --  94.5  PLT 249  --   --  99*   Basic Metabolic Panel: Recent Labs  Lab 08/03/19 1721 08/03/19 1725 08/04/19 0944 08/04/19 1250 08/05/19 0435  NA  --  141 142  --  140  K  --  2.1* 2.9* 3.2* 4.3  CL  --  94* 108  --  109  CO2  --  32 27  --  24  GLUCOSE  --  148* 105*  --  90  BUN  --  58* 31*  --  20  CREATININE  --  1.55* 1.03*  --  0.86  CALCIUM  --  8.9 7.3*  --  8.2*  MG 3.6*  --  2.9*  --   --    Liver Function Tests: Recent Labs  Lab 08/03/19 1725  AST 20  ALT 16  ALKPHOS 51  BILITOT 0.9  PROT 5.5*  ALBUMIN 3.2*   Coagulation  Profile: Recent Labs  Lab 08/03/19 1725  INR 0.9   HbA1C: No results for input(s): HGBA1C in the last 72 hours. CBG: No results for input(s): GLUCAP in the last 168 hours.  Recent Results (from the past 240 hour(s))  Urine culture     Status: Abnormal   Collection Time: 08/03/19  4:45 PM   Specimen: In/Out Cath Urine  Result Value Ref Range Status   Specimen Description IN/OUT CATH URINE  Final   Special Requests   Final    NONE Performed at [facility], [address] [place], [place] [address]    Culture >=100,000 COLONIES/mL ENTEROCOCCUS FAECALIS (A)  Final   Report Status 08/05/2019 FINAL  Final   Organism ID, Bacteria ENTEROCOCCUS FAECALIS (A)  Final      Susceptibility   Enterococcus faecalis - MIC*    AMPICILLIN <=2 SENSITIVE Sensitive     NITROFURANTOIN <=16 SENSITIVE Sensitive     VANCOMYCIN 1 SENSITIVE Sensitive     * >=100,000 COLONIES/mL ENTEROCOCCUS FAECALIS  Respiratory Panel by RT PCR (Flu A&B, Covid) - Urine, Clean Catch     Status: None   Collection Time: 08/03/19  5:02 PM   Specimen: Urine, Clean Catch  Result Value Ref Range Status   SARS Coronavirus 2 by RT PCR NEGATIVE NEGATIVE Final    Comment: (NOTE) SARS-CoV-2 target nucleic acids are NOT DETECTED. The SARS-CoV-2 RNA is generally detectable in upper respiratoy specimens during the acute phase of infection. The lowest concentration of SARS-CoV-2 viral copies this assay can detect is 131 copies/mL. A negative result does not preclude SARS-Cov-2 infection and should not be used as the sole basis for treatment or other patient management decisions. A negative result may occur with  improper specimen collection/handling, submission of specimen other than nasopharyngeal swab, presence of viral mutation(s) within the areas targeted by this assay, and inadequate number of viral copies (<131 copies/mL). A negative result must be combined with clinical observations, patient history, and  epidemiological information. The expected result is Negative. Fact Sheet for Patients:  https://www.moore.com/ Fact Sheet for Healthcare Providers:  https://www.young.biz/ This test is not yet ap proved or cleared by the [place] FDA and  has been authorized for detection and/or diagnosis of SARS-CoV-2 by FDA under an Emergency Use Authorization (EUA). This EUA will remain  in effect (meaning this test can be used) for the duration of the COVID-19 declaration under Section 564(b)(1) of the Act, 21 U.S.C. section 360bbb-3(b)(1), unless the authorization is terminated or revoked sooner.    Influenza A by PCR NEGATIVE NEGATIVE Final   Influenza B by PCR NEGATIVE NEGATIVE Final    Comment: (NOTE) The Xpert Xpress SARS-CoV-2/FLU/RSV assay is intended as an aid in  the diagnosis of influenza from Nasopharyngeal swab specimens and  should not be used as  a sole basis for treatment. Nasal washings and  aspirates are unacceptable for Xpert Xpress SARS-CoV-2/FLU/RSV  testing. Fact Sheet for Patients: https://www.moore.com/ Fact Sheet for Healthcare Providers: https://www.young.biz/ This test is not yet approved or cleared by the [place] FDA and  has been authorized for detection and/or diagnosis of SARS-CoV-2 by  FDA under an Emergency Use Authorization (EUA). This EUA will remain  in effect (meaning this test can be used) for the duration of the  Covid-19 declaration under Section 564(b)(1) of the Act, 21  U.S.C. section 360bbb-3(b)(1), unless the authorization is  terminated or revoked. Performed at [facility], [address], [place], [place] [address]   Blood Culture (routine x 2)     Status: None (Preliminary result)   Collection Time: 08/03/19  5:25 PM   Specimen: BLOOD  Result Value Ref Range Status   Specimen Description BLOOD SITE NOT SPECIFIED  Final   Special Requests   Final     BOTTLES DRAWN AEROBIC AND ANAEROBIC Blood Culture adequate volume   Culture   Final    NO GROWTH 2 DAYS Performed at [facility], [address] [place], [place] [address]    Report Status PENDING  Incomplete  Blood Culture (routine x 2)     Status: None (Preliminary result)   Collection Time: 08/03/19  9:40 PM   Specimen: BLOOD  Result Value Ref Range Status   Specimen Description BLOOD RIGHT ANTECUBITAL  Final   Special Requests   Final    BOTTLES DRAWN AEROBIC AND ANAEROBIC Blood Culture adequate volume   Culture   Final    NO GROWTH 2 DAYS Performed at [facility], [address] [place], [place] [address]    Report Status PENDING  Incomplete     Radiology Studies: No results found.  Pamella Pert, MD, PhD [facility]  Between 7 am - 7 pm I am available, please contact me via Amion or Securechat  Between 7 pm - 7 am I am not available, please contact night coverage MD/APP via Amion

## 2019-08-05 NOTE — Progress Notes (Signed)
Progress Note  Patient Name: Bethany Stuart Date of Encounter: 08/05/2019  Primary Cardiologist: Berniece Salines, DO   Subjective   Feels a little better, no CP, no SOB.  Post EGD  Inpatient Medications    Scheduled Meds: . atorvastatin  40 mg Oral Daily  . fluticasone furoate-vilanterol  1 puff Inhalation Daily   And  . umeclidinium bromide  1 puff Inhalation Daily  . potassium chloride  40 mEq Oral BID  . sodium chloride flush  3 mL Intravenous Q12H  . traZODone  100 mg Oral QHS   Continuous Infusions: . pantoprozole (PROTONIX) infusion 8 mg/hr (08/04/19 0801)   PRN Meds: acetaminophen **OR** acetaminophen, ALPRAZolam, fentaNYL (SUBLIMAZE) injection, ipratropium-albuterol, oxyCODONE-acetaminophen   Vital Signs    Vitals:   08/05/19 0501 08/05/19 0503 08/05/19 0841 08/05/19 0937  BP: (!) 153/75   (!) 159/84  Pulse: 68  80 83  Resp: 18   20  Temp: 98.4 F (36.9 C)   98.3 F (36.8 C)  TempSrc: Oral   Oral  SpO2: 100%  100% 100%  Weight:  92.4 kg    Height:        Intake/Output Summary (Last 24 hours) at 08/05/2019 0959 Last data filed at 08/05/2019 3419 Gross per 24 hour  Intake 2100 ml  Output 100 ml  Net 2000 ml   Last 3 Weights 08/05/2019 08/04/2019 08/03/2019  Weight (lbs) 203 lb 12.8 oz 196 lb 8 oz 196 lb  Weight (kg) 92.443 kg 89.132 kg 88.905 kg      Telemetry    Normal sinus rhythm with rates in the 60's to 80's. - Personally Reviewed, no changes  ECG    No new ECG tracing today. - Personally Reviewed  Physical Exam   GEN: Well nourished, well developed, in no acute distress  HEENT: normal  Neck: no JVD, carotid bruits, or masses Cardiac: RRR; no murmurs, rubs, or gallops,no edema  Respiratory:  clear to auscultation bilaterally, normal work of breathing GI: soft, nontender, nondistended, + BS MS: no deformity or atrophy  Skin: warm and dry, no rash Neuro:  Alert and Oriented x 3, Strength and sensation are intact Psych: euthymic mood, full  affect   Labs    High Sensitivity Troponin:   Recent Labs  Lab 08/03/19 1725 08/03/19 2115 08/03/19 2355  TROPONINIHS 155* 182* 134*      Chemistry Recent Labs  Lab 08/03/19 1725 08/03/19 1725 08/04/19 0944 08/04/19 1250 08/05/19 0435  NA 141  --  142  --  140  K 2.1*   < > 2.9* 3.2* 4.3  CL 94*  --  108  --  109  CO2 32  --  27  --  24  GLUCOSE 148*  --  105*  --  90  BUN 58*  --  31*  --  20  CREATININE 1.55*  --  1.03*  --  0.86  CALCIUM 8.9  --  7.3*  --  8.2*  PROT 5.5*  --   --   --   --   ALBUMIN 3.2*  --   --   --   --   AST 20  --   --   --   --   ALT 16  --   --   --   --   ALKPHOS 51  --   --   --   --   BILITOT 0.9  --   --   --   --  GFRNONAA 38*  --  >60  --  >60  GFRAA 44*  --  >60  --  >60  ANIONGAP 15  --  7  --  7   < > = values in this interval not displayed.     Hematology Recent Labs  Lab 08/03/19 1527 08/03/19 1527 08/03/19 2355 08/04/19 1250 08/05/19 0435  WBC 21.0*  --   --   --  9.0  RBC 3.30*  --   --   --  2.73*  HGB 10.3*   < > 7.3* 9.3* 8.3*  HCT 30.8*   < > 22.6* 28.9* 25.8*  MCV 93.3  --   --   --  94.5  MCH 31.2  --   --   --  30.4  MCHC 33.4  --   --   --  32.2  RDW 16.1*  --   --   --  17.8*  PLT 249  --   --   --  99*   < > = values in this interval not displayed.    BNPNo results for input(s): BNP, PROBNP in the last 168 hours.   DDimer No results for input(s): DDIMER in the last 168 hours.   Radiology    CT Angio Chest PE W/Cm &/Or Wo Cm  Result Date: 08/03/2019 CLINICAL DATA:  Shortness of breath. EXAM: CT ANGIOGRAPHY CHEST WITH CONTRAST TECHNIQUE: Multidetector CT imaging of the chest was performed using the standard protocol during bolus administration of intravenous contrast. Multiplanar CT image reconstructions and MIPs were obtained to evaluate the vascular anatomy. CONTRAST:  69mL OMNIPAQUE IOHEXOL 350 MG/ML SOLN COMPARISON:  CT angiogram chest June 14, 2018 FINDINGS: Cardiovascular: There is no  demonstrable pulmonary embolus. There is no thoracic aortic aneurysm or dissection. Visualized great vessels appear unremarkable. There are foci of aortic atherosclerosis. There are foci coronary artery calcification. There is no appreciable pericardial effusion or pericardial thickening. Mediastinum/Nodes: Visualized thyroid appears normal. There are scattered subcentimeter mediastinal lymph nodes. There is no adenopathy by size criteria. No esophageal lesions are evident. Lungs/Pleura: There is underlying centrilobular emphysematous change. No edema or airspace opacity. No appreciable pleural effusions. Upper Abdomen: There is left adrenal hypertrophy, a finding also present previously. Visualized upper abdominal structures otherwise appear unremarkable. Musculoskeletal: There are no blastic or lytic bone lesions. No evident chest wall lesions. Review of the MIP images confirms the above findings. IMPRESSION: 1. No demonstrable pulmonary embolus. No thoracic aortic aneurysm or dissection. There is aortic atherosclerosis as well as foci of coronary artery calcification. 2. Underlying centrilobular emphysema. No edema or airspace opacity. 3.  No evident adenopathy. 4. Left adrenal hypertrophy, a finding of uncertain etiology or significance. Aortic Atherosclerosis (ICD10-I70.0) and Emphysema (ICD10-J43.9). Electronically Signed   By: Bretta Bang III M.D.   On: 08/03/2019 19:43   CT ABDOMEN PELVIS W CONTRAST  Result Date: 08/03/2019 CLINICAL DATA:  Abdominal pain and black tarry stools. EXAM: CT ABDOMEN AND PELVIS WITH CONTRAST TECHNIQUE: Multidetector CT imaging of the abdomen and pelvis was performed using the standard protocol following bolus administration of intravenous contrast. CONTRAST:  53mL OMNIPAQUE IOHEXOL 350 MG/ML SOLN COMPARISON:  06/03/2018 FINDINGS: Lower chest: The lung bases are clear of acute process. No pleural effusion or pulmonary lesions. The heart is normal in size. No pericardial  effusion. The distal esophagus and aorta are unremarkable. Hepatobiliary: No focal hepatic lesions or intrahepatic biliary dilatation. The gallbladder is surgically absent. No common bile duct dilatation. Pancreas: No mass, inflammation  or ductal dilatation. Spleen: Normal size. No focal lesions. Adrenals/Urinary Tract: Stable left adrenal gland nodules, likely benign adenomas. The right adrenal gland is unremarkable. Both kidneys are unremarkable. Stomach/Bowel: The stomach, duodenum, small bowel and colon are unremarkable. No acute inflammatory changes, mass lesions or obstructive findings. The terminal ileum appears normal. The appendix is surgically absent. There is a moderate amount of fluid throughout the colon with scattered air-fluid levels typically seen with diarrhea. I do not see any wall thickening or pericolonic inflammatory changes. Vascular/Lymphatic: Moderate atherosclerotic calcifications involving the aorta and iliac arteries but no aneurysm or dissection. The branch vessels are patent. The major venous structures are patent. No mesenteric or retroperitoneal mass or adenopathy. Reproductive: Surgically absent. Other: No pelvic mass or adenopathy. No free pelvic fluid collections. No inguinal mass or adenopathy. No abdominal wall hernia or subcutaneous lesions. Musculoskeletal: Scoliosis and degenerative lumbar spondylosis with multilevel disc disease and facet disease. No acute bony findings or bone lesions. IMPRESSION: 1. Moderate fluid and scattered air-fluid levels throughout the colon typically seen with diarrhea. No colonic wall thickening or pericolonic inflammatory changes. 2. No other acute abdominal/pelvic findings and no mass lesions or adenopathy. 3. Status post cholecystectomy. No biliary dilatation. 4. Stable left adrenal gland nodules, likely benign adenomas. Electronically Signed   By: Rudie Meyer M.D.   On: 08/03/2019 19:43   DG Chest Port 1 View  Result Date: 08/03/2019  CLINICAL DATA:  54 year old female right side chest pain. Bloody stools for 3 days. EXAM: PORTABLE CHEST 1 VIEW COMPARISON:  [facility] portable chest 09/03/2018 and earlier. FINDINGS: Portable AP upright view at 1703 hours. Attenuated bronchovascular markings in the lungs in keeping with emphysema demonstrated by CT on 12/01/2018. Stable lung volumes. Mediastinal contours are stable with some central pulmonary artery enlargement. Visualized tracheal air column is within normal limits. Allowing for portable technique the lungs are clear. No acute osseous abnormality identified. IMPRESSION: 1.  No acute cardiopulmonary abnormality. 2. Emphysema (ICD10-J43.9). Chronic central pulmonary artery enlargement raising the possibility of pulmonary artery hypertension. Electronically Signed   By: Odessa Fleming M.D.   On: 08/03/2019 17:22    Cardiac Studies   Left Heart Catheterization 06/29/2019:  Right dominant coronary anatomy.  40 to 60% mid RCA eccentric stenosis, unchanged/improved when compared to 1 year ago.  Normal left main  Widely patent LAD  Normal circumflex  Hyperdynamic left ventricle.  EF 65% or greater.  LVEDP 14 mmHg.  No intracavitary gradient recorded.  RECOMMENDATIONS:  Stable anatomy when compared to prior.  We encouraged risk factor modification, especially smoking cessation.  We encouraged compliance with antihypertensive regimen.  Patient Profile   Ms. Merriman is a 54 y.o. 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 Cardiology asked to see for evaluation of chest pain and elevated troponin.   Assessment & Plan    Demand Ischemia - Patient presented with abdominal pain and frequent melena but also endorsed  chest pain that was both pleuritic and exertional in nature.  - High-sensitivity troponin mildly elevated and flat at 155 > 182 >> 134.  - EKG shows diffuse ST depression which has been seen on prior EKGs. - Patient recently had cardiac catheterization on 06/29/2019 which showed 40-60% mid RCA stenosis (unchanged from cath in 2019).  - Will go  ahead and check Echo (outpatient Echo scheduled for 2/15). - ACS unlikely. Suspect demand ischemia in setting of anemia. 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  - Aspirin and Plavix currently on hold due to anemia. OK to restart Plavix per GI   - Continue statin.  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  - Will go ahead and recheck Echo while here.   Hypertension -  BP initially soft but has since improved. Most recent BP 152/75. - Home Coreg, Diltiazem, and HCTZ held on admission due to soft BP and concern for hypovolemia.  - Now that BP has improved, I will add back Coreg 25 BID Continue to hold HCTZ for now given recent renal function.   Anemia with Suspected Acute Upper GI Bleed - Hemoglobin 10.3 on admission, down from 12.4 on 06/25/2019. Received 1 unit of PRBC. - GI consulted and EGD showed non bleeding gastric ulcers.  - PPI, avoid ASA, NSAIDS  - OK for PLAVIX per GI.  I think for now, we can hold off on the Plavix and the aspirin as she continues to heal.  We will reconsider initiation once again in clinic follow-up. -Hemoglobin this morning 8.3  Hypokalemia - Potassium 2.1 on admission.  - Now 4.3  Prolonged QTc  - QTc 501 ms on EKG  - Potassium 2.1. Being repleted. Repeat BMET pending. - Magnesium 3.6 yesterday.    - no arrhythmias  AKI  - Creatinine 1.55 on admission. Up form 0.82 in 06/2019. - Likely pre-renal due to hypovolemia. - Received IV fluids.  - Repeat BMET 0.86. Excellent  Otherwise, per primary team.  - COPD - SIRS - Hypermagnesemia  - No changes   For questions or updates, please contact [facility] Please consult [email or web address] for contact info under        Signed, Donato Schultz, MD  08/05/2019, 9:59 AM

## 2019-08-05 NOTE — Telephone Encounter (Signed)
-----   Message from Tressia Danas, MD sent at 08/05/2019 12:57 PM EST ----- Regarding: EGD in 3 months Needs in EGD in 3 months with me. Can be at the [facility]. Thank you.

## 2019-08-05 NOTE — Progress Notes (Signed)
Progress Note   Subjective  Chief Complaint: Melena, brown emesis  Status post EGD 08/04/2019: Normal esophagus, nonbleeding gastric ulcers with no stigmata of bleeding, likely due to ibuprofen, erythematous to adenopathy and otherwise normal  Patient doing well this morning, tells me that she is hungry and eager to eat something other than liquids. Denies any abdominal pain or new GI complaints, passed a semi-solid stool this morning.   Objective   Vital signs in last 24 hours: Temp:  [97.5 F (36.4 C)-99.3 F (37.4 C)] 98.3 F (36.8 C) (01/28 0937) Pulse Rate:  [66-86] 83 (01/28 0937) Resp:  [16-20] 20 (01/28 0937) BP: (115-171)/(39-86) 159/84 (01/28 0937) SpO2:  [95 %-100 %] 100 % (01/28 0937) Weight:  [92.4 kg] 92.4 kg (01/28 0503) Last BM Date: 08/04/19 General:    Overweight white female in NAD Heart:  Regular rate and rhythm; no murmurs Lungs: )2 via [place], Respirations even and unlabored, lungs CTA bilaterally Abdomen:  Soft, nontender and nondistended. Normal bowel sounds. Extremities:  Without edema. Neurologic:  Alert and oriented,  grossly normal neurologically. Psych:  Cooperative. Normal mood and affect.  Intake/Output from previous day: 01/27 0701 - 01/28 0700 In: 2415 [P.O.:1080; I.V.:1020; Blood:315] Out: 100 [Urine:100]  Lab Results: Recent Labs    08/03/19 1527 08/03/19 1527 08/03/19 2355 08/04/19 1250 08/05/19 0435  WBC 21.0*  --   --   --  9.0  HGB 10.3*   < > 7.3* 9.3* 8.3*  HCT 30.8*   < > 22.6* 28.9* 25.8*  PLT 249  --   --   --  99*   < > = values in this interval not displayed.   BMET Recent Labs    08/03/19 1725 08/03/19 1725 08/04/19 0944 08/04/19 1250 08/05/19 0435  NA 141  --  142  --  140  K 2.1*   < > 2.9* 3.2* 4.3  CL 94*  --  108  --  109  CO2 32  --  27  --  24  GLUCOSE 148*  --  105*  --  90  BUN 58*  --  31*  --  20  CREATININE 1.55*  --  1.03*  --  0.86  CALCIUM 8.9  --  7.3*  --  8.2*   < > = values in this  interval not displayed.   LFT Recent Labs    08/03/19 1725  PROT 5.5*  ALBUMIN 3.2*  AST 20  ALT 16  ALKPHOS 51  BILITOT 0.9   PT/INR Recent Labs    08/03/19 1725  LABPROT 12.5  INR 0.9    Studies/Results: CT Angio Chest PE W/Cm &/Or Wo Cm  Result Date: 08/03/2019 CLINICAL DATA:  Shortness of breath. EXAM: CT ANGIOGRAPHY CHEST WITH CONTRAST TECHNIQUE: Multidetector CT imaging of the chest was performed using the standard protocol during bolus administration of intravenous contrast. Multiplanar CT image reconstructions and MIPs were obtained to evaluate the vascular anatomy. CONTRAST:  58mL OMNIPAQUE IOHEXOL 350 MG/ML SOLN COMPARISON:  CT angiogram chest June 14, 2018 FINDINGS: Cardiovascular: There is no demonstrable pulmonary embolus. There is no thoracic aortic aneurysm or dissection. Visualized great vessels appear unremarkable. There are foci of aortic atherosclerosis. There are foci coronary artery calcification. There is no appreciable pericardial effusion or pericardial thickening. Mediastinum/Nodes: Visualized thyroid appears normal. There are scattered subcentimeter mediastinal lymph nodes. There is no adenopathy by size criteria. No esophageal lesions are evident. Lungs/Pleura: There is underlying centrilobular emphysematous change. No edema or  airspace opacity. No appreciable pleural effusions. Upper Abdomen: There is left adrenal hypertrophy, a finding also present previously. Visualized upper abdominal structures otherwise appear unremarkable. Musculoskeletal: There are no blastic or lytic bone lesions. No evident chest wall lesions. Review of the MIP images confirms the above findings. IMPRESSION: 1. No demonstrable pulmonary embolus. No thoracic aortic aneurysm or dissection. There is aortic atherosclerosis as well as foci of coronary artery calcification. 2. Underlying centrilobular emphysema. No edema or airspace opacity. 3.  No evident adenopathy. 4. Left adrenal  hypertrophy, a finding of uncertain etiology or significance. Aortic Atherosclerosis (ICD10-I70.0) and Emphysema (ICD10-J43.9). Electronically Signed   By: Lowella Grip III M.D.   On: 08/03/2019 19:43   CT ABDOMEN PELVIS W CONTRAST  Result Date: 08/03/2019 CLINICAL DATA:  Abdominal pain and black tarry stools. EXAM: CT ABDOMEN AND PELVIS WITH CONTRAST TECHNIQUE: Multidetector CT imaging of the abdomen and pelvis was performed using the standard protocol following bolus administration of intravenous contrast. CONTRAST:  21mL OMNIPAQUE IOHEXOL 350 MG/ML SOLN COMPARISON:  06/03/2018 FINDINGS: Lower chest: The lung bases are clear of acute process. No pleural effusion or pulmonary lesions. The heart is normal in size. No pericardial effusion. The distal esophagus and aorta are unremarkable. Hepatobiliary: No focal hepatic lesions or intrahepatic biliary dilatation. The gallbladder is surgically absent. No common bile duct dilatation. Pancreas: No mass, inflammation or ductal dilatation. Spleen: Normal size. No focal lesions. Adrenals/Urinary Tract: Stable left adrenal gland nodules, likely benign adenomas. The right adrenal gland is unremarkable. Both kidneys are unremarkable. Stomach/Bowel: The stomach, duodenum, small bowel and colon are unremarkable. No acute inflammatory changes, mass lesions or obstructive findings. The terminal ileum appears normal. The appendix is surgically absent. There is a moderate amount of fluid throughout the colon with scattered air-fluid levels typically seen with diarrhea. I do not see any wall thickening or pericolonic inflammatory changes. Vascular/Lymphatic: Moderate atherosclerotic calcifications involving the aorta and iliac arteries but no aneurysm or dissection. The branch vessels are patent. The major venous structures are patent. No mesenteric or retroperitoneal mass or adenopathy. Reproductive: Surgically absent. Other: No pelvic mass or adenopathy. No free pelvic  fluid collections. No inguinal mass or adenopathy. No abdominal wall hernia or subcutaneous lesions. Musculoskeletal: Scoliosis and degenerative lumbar spondylosis with multilevel disc disease and facet disease. No acute bony findings or bone lesions. IMPRESSION: 1. Moderate fluid and scattered air-fluid levels throughout the colon typically seen with diarrhea. No colonic wall thickening or pericolonic inflammatory changes. 2. No other acute abdominal/pelvic findings and no mass lesions or adenopathy. 3. Status post cholecystectomy. No biliary dilatation. 4. Stable left adrenal gland nodules, likely benign adenomas. Electronically Signed   By: Marijo Sanes M.D.   On: 08/03/2019 19:43   DG Chest Port 1 View  Result Date: 08/03/2019 CLINICAL DATA:  54 year old female right side chest pain. Bloody stools for 3 days. EXAM: PORTABLE CHEST 1 VIEW COMPARISON:  [facility] portable chest 09/03/2018 and earlier. FINDINGS: Portable AP upright view at 1703 hours. Attenuated bronchovascular markings in the lungs in keeping with emphysema demonstrated by CT on 12/01/2018. Stable lung volumes. Mediastinal contours are stable with some central pulmonary artery enlargement. Visualized tracheal air column is within normal limits. Allowing for portable technique the lungs are clear. No acute osseous abnormality identified. IMPRESSION: 1.  No acute cardiopulmonary abnormality. 2. Emphysema (ICD10-J43.9). Chronic central pulmonary artery enlargement raising the possibility of pulmonary artery hypertension. Electronically Signed   By: Genevie Ann M.D.   On: 08/03/2019 17:22  EGD 08/04/2019 Dr. Orvan Falconer Findings:      The examined esophagus was normal.      Six non-bleeding cratered gastric ulcers with no stigmata of bleeding       were found in the gastric antrum, in the prepyloric region of the       stomach and the two largest were in the the pyloric channel. There is       some narrowing to the pyloric channel but the  gastroscope advances with       only minimal resistance. The largest lesion was 10 mm in largest       dimension. Biopsies were taken from the antrum, body, and fundus with a       cold forceps for histology. Estimated blood loss was minimal. No blood       or heme was present in the stomach.      Diffuse mildly erythematous mucosa without active bleeding and with no       stigmata of bleeding was found in the duodenal bulb. Biopsies were taken       from the bulb, 1st portion, and 2nd portion of the duodenum with a cold       forceps for histology. No blood or heme was present in the stomach.      The cardia and gastric fundus were normal on retroflexion.      The exam was otherwise without abnormality. Impression:               - Normal esophagus.                           - Non-bleeding gastric ulcers with no stigmata of                            bleeding. Likely due to ibuprofen. Biopsied to                            exclude H pylori.                           - Erythematous duodenopathy. Biopsied.                           - The examination was otherwise normal. Recommendation:           - Clear liquid diet. Advance diet as hemoglobin                            stabilizes.                           - Resume Plavix (clopidogrel) at prior dose today.                           - No aspirin, ibuprofen, naproxen, or other                            non-steroidal anti-inflammatory drugs.                           - Pantoprazole 40 mg BID x  8 weeks.                           - Await pathology results.                           - Avoid all NSAIDs as you are able. Stop using                            ibuprofen. Consider alternatives for long-term                            management of back pain.                           - Repeat upper endoscopy in 3 months to check                            healing of the ulcers.   Assessment / Plan:   Assessment: 1.  Melena: EGD 08/04/2019 with  nonbleeding gastric ulcers, erythematous to adenopathy 2.  Chronic anticoagulation: With Plavix 3.  Blood loss anemia: Hemoglobin 10.3-->7.3-->1 unit PRBCs> 8.3 4.  Demand ischemia 5.  Hypokalemia: Resolved  Plan: 1.  Again repeat upper endoscopy in 3 months to check for healing 2.  Continue Pantoprazole 40 mg twice daily x8 weeks 3.  Avoid NSAIDs 4. Increased to soft diet today, as long as hgb remains stable can advance to regular for dinner 5. Continue to monitor hgb with transfusion as needed 6. Patient will be arranged for outpatient follow up in our clinic. 7. We will likely sign off, please let us know if we may be of any further assistance. Await final recommendations from Dr. Orvan Falconer later today.   LOS: 2 days   Unk Lightning  08/05/2019, 10:59 AM

## 2019-08-05 NOTE — Progress Notes (Signed)
   Dr. Orvan Falconer note EGD reviewed. Non bleeding gastric ulcers  OK to resume Plavix 75mg  monotherapy for her CAD.  AVOID ASA, NSAIDS.  , MD

## 2019-08-05 NOTE — Progress Notes (Signed)
  Echocardiogram 2D Echocardiogram has been performed.  Bethany Stuart 08/05/2019, 2:28 PM

## 2019-08-06 ENCOUNTER — Other Ambulatory Visit: Payer: Self-pay | Admitting: Physician Assistant

## 2019-08-06 ENCOUNTER — Encounter: Payer: Self-pay | Admitting: Gastroenterology

## 2019-08-06 DIAGNOSIS — J449 Chronic obstructive pulmonary disease, unspecified: Secondary | ICD-10-CM

## 2019-08-06 DIAGNOSIS — I214 Non-ST elevation (NSTEMI) myocardial infarction: Secondary | ICD-10-CM

## 2019-08-06 LAB — CBC
HCT: 29.4 % — ABNORMAL LOW (ref 36.0–46.0)
Hemoglobin: 9.1 g/dL — ABNORMAL LOW (ref 12.0–15.0)
MCH: 30.6 pg (ref 26.0–34.0)
MCHC: 31 g/dL (ref 30.0–36.0)
MCV: 99 fL (ref 80.0–100.0)
Platelets: 100 10*3/uL — ABNORMAL LOW (ref 150–400)
RBC: 2.97 MIL/uL — ABNORMAL LOW (ref 3.87–5.11)
RDW: 18.4 % — ABNORMAL HIGH (ref 11.5–15.5)
WBC: 10.8 10*3/uL — ABNORMAL HIGH (ref 4.0–10.5)
nRBC: 0.2 % (ref 0.0–0.2)

## 2019-08-06 LAB — BASIC METABOLIC PANEL
Anion gap: 9 (ref 5–15)
BUN: 13 mg/dL (ref 6–20)
CO2: 20 mmol/L — ABNORMAL LOW (ref 22–32)
Calcium: 8.5 mg/dL — ABNORMAL LOW (ref 8.9–10.3)
Chloride: 108 mmol/L (ref 98–111)
Creatinine, Ser: 0.89 mg/dL (ref 0.44–1.00)
GFR calc Af Amer: >60 mL/min (ref >60)
GFR calc non Af Amer: >60 mL/min (ref >60)
Glucose, Bld: 97 mg/dL (ref 70–99)
Potassium: 4.7 mmol/L (ref 3.5–5.1)
Sodium: 137 mmol/L (ref 135–145)

## 2019-08-06 LAB — SURGICAL PATHOLOGY

## 2019-08-06 MED ORDER — OXYCODONE-ACETAMINOPHEN 5-325 MG PO TABS: 1.0000 | ORAL_TABLET | Freq: Four times a day (QID) | ORAL | 0 refills | Status: AC | PRN

## 2019-08-06 MED ORDER — PANTOPRAZOLE SODIUM 40 MG PO TBEC: 40.0000 mg | DELAYED_RELEASE_TABLET | Freq: Two times a day (BID) | ORAL | 1 refills | Status: AC

## 2019-08-06 MED ORDER — AMLODIPINE BESYLATE 5 MG PO TABS: 5.0000 mg | ORAL_TABLET | Freq: Every day | ORAL | 1 refills | Status: DC

## 2019-08-06 MED ORDER — AMLODIPINE BESYLATE 5 MG PO TABS
5.0000 mg | ORAL_TABLET | Freq: Every day | ORAL | Status: DC
  Administered 2019-08-06: 5 mg via ORAL
  Filled 2019-08-06: qty 1

## 2019-08-06 NOTE — TOC Progression Note (Signed)
Transition of Care [facility]) - Progression Note    Patient Details  Name: Bethany Stuart MRN: [id number] Date of Birth: April 13, 1966  [facility] CM/SW Contact  Leone Haven, RN Phone Number: 08/06/2019, 4:17 PM  Clinical Narrative:    Patient is for discharge and need transportation assitance, NCM assisted patient with taxi voucher.        Expected Discharge Plan and Services           Expected Discharge Date: 08/06/19                                     Social Determinants of Health (SDOH) Interventions    Readmission Risk Interventions No flowsheet data found.

## 2019-08-06 NOTE — Discharge Summary (Signed)
Physician Discharge Summary  Bethany Stuart YFV:[id number] DOB: 08-Jul-1966 DOA: 08/03/2019  PCP: Eloisa Northern, MD  Admit date: 08/03/2019 Discharge date: 08/06/2019  Admitted From: home Disposition:  home  Recommendations for Outpatient Follow-up:  1. Follow up with PCP in 1-2 weeks 2. Follow up with cardiology and gastroenterology as below in 2-4 weeks  Home Health: none Equipment/Devices: none  Discharge Condition: stable CODE STATUS: Full code Diet recommendation: heart healthy  HPI: Per admitting MD, Bethany Stuart is a 54 y.o. female with medical history significant for COPD, hypertension, depression, and coronary artery disease, now presenting to the emergency department with abdominal pain, chest pain, and dark, tarry, loose stools.  Patient reports that she had been doing fairly well at home since her recent hospitalization until 3 days ago when she developed pain in the mid abdomen with recurrent dark and loose stools.  She has also developed chest pain, localized to the central chest, not reproducible, worse with activity, but fairly constant.  She denies any fevers or chills, reports that her chronic cough has been stable, and her chronic dyspnea has also been stable. ED Course: Upon arrival to the ED, patient is found to be afebrile, saturating well on room air, mildly tachypneic, tachycardic in the low 100s, and with stable blood pressure.  EKG features sinus rhythm with ST depressions and QTc interval 527 milliseconds.  Chest x-ray notable for emphysematous changes and chronic central pulmonary artery enlargement, but no acute findings.  CTA chest is negative for PE.  CT the abdomen and pelvis notable for fluid and air-fluid levels throughout the colon.  Chemistry panel with potassium 2.1, BUN 58, and creatinine 1.55, up from 0.82 last month.  Fecal occult blood testing is positive.  COVID-19 PCR is negative.  CBC with leukocytosis to 21,000 and hemoglobin 10.3, down from 12.4 last  month.  Lactic acid is 2.8 and troponin I 155.  Type and screen was performed, blood and urine cultures collected, Rocephin and Flagyl administered, patient also given 2 L normal saline, IV potassium, and IV Protonix.  Gastroenterology and cardiology were consulted by the ED physician.  Hospital Course / Discharge diagnoses: Principal Problem Acute blood loss anemia due to acute upper GI bleeding -She presented with upper abdominal pain, melena, 2 g drop in hemoglobin from baseline positive fecal occult. Gastroenterology has been consulted, she underwent transfusion with a unit of packed red blood cells. underwent an EGD on 1/27 with nonbleeding gastric ulcers with no stigmata of recent bleeding, likely due to ibuprofen.  Will biopsy to exclude H. Pylori. For now continue PPI twice daily x8 weeks  Active Problems Chest pain, elevated troponin, known CAD -Cardiology consulted and followed patient while hospitalized, this is likely secondary to demand ischemia from anemia, GI bleed. She recently had a left heart cath on 12/22 with 40-60% mid RCA stenosis, now continue medical management, she used to be on aspirin and Plavix.  Plavix can be resumed and per cardiology she will be on this for monotherapy Hypokalemia -Replaced, now normalized COPD, tobacco use, hypoxic respiratory failure -hypoxia resolved.  Acute kidney injury -Serum creatinine 1.55 on admission up from 0.88 last month, likely prerenal in the setting of loose stools, bleeding, anemia. Has received IV fluids and her creatinine has now normalized Back pain, chronic - main issue with NSAID use, discontinued all NSAIDs. She cannot tolerate tramadol due to vomiting, lidocaine patches don't work. She used to be on Percocet, will start a very short course, advised to follow up  with PCP for long term planning  Discharge Instructions   Allergies as of 08/06/2019      Reactions   Fioricet [butalbital-apap-caffeine] Other (See Comments)   "crazy  in my head"   Klonopin [clonazepam] Other (See Comments)   "crazy in my head"   Penicillins Rash   Has patient had a PCN reaction causing immediate rash, facial/tongue/throat swelling, SOB or lightheadedness with hypotension: Yes Has patient had a PCN reaction causing severe rash involving mucus membranes or skin necrosis: No Has patient had a PCN reaction that required hospitalization: No Has patient had a PCN reaction occurring within the last 10 years: No If all of the above answers are "NO", then may proceed with Cephalosporin use.      Medication List    STOP taking these medications   aspirin 81 MG EC tablet   diltiazem 120 MG 24 hr capsule Commonly known as: CARDIZEM CD     TAKE these medications   ALPRAZolam 0.5 MG tablet Commonly known as: XANAX Take 0.5 mg by mouth daily as needed for anxiety.   amLODipine 5 MG tablet Commonly known as: NORVASC Take 1 tablet (5 mg total) by mouth daily. Start taking on: August 07, 2019   atorvastatin 40 MG tablet Commonly known as: LIPITOR Take 40 mg by mouth daily.   carvedilol 25 MG tablet Commonly known as: COREG Take 1 tablet (25 mg total) by mouth 2 (two) times daily.   clopidogrel 75 MG tablet Commonly known as: PLAVIX Take 1 tablet (75 mg total) by mouth daily. Continue aspirin and Plavix together for 1 year What changed: additional instructions   FLUoxetine 20 MG tablet Commonly known as: PROZAC Take 20 mg by mouth daily.   hydrochlorothiazide 25 MG tablet Commonly known as: HYDRODIURIL Take 1 tablet (25 mg total) by mouth daily.   ipratropium-albuterol 0.5-2.5 (3) MG/3ML Soln Commonly known as: DUONEB Take 3 mLs by nebulization every 4 (four) hours as needed (for shortness of breath).   oxyCODONE-acetaminophen 5-325 MG tablet Commonly known as: PERCOCET/ROXICET Take 1 tablet by mouth every 6 (six) hours as needed for up to 5 days for severe pain.   pantoprazole 40 MG tablet Commonly known as:  Protonix Take 1 tablet (40 mg total) by mouth 2 (two) times daily before a meal.   traZODone 100 MG tablet Commonly known as: DESYREL Take 100 mg by mouth at bedtime.   Trelegy Ellipta 100-62.5-25 MCG/INH Aepb Generic drug: Fluticasone-Umeclidin-Vilant Inhale 1 puff into the lungs daily.      Follow-up Information    Eloisa Northern, MD. Schedule an appointment as soon as possible for a visit in 1 week(s).   Specialty: Internal Medicine Contact information: [address] [place] [address] [phone]        Thomasene Ripple, DO .   Specialty: Cardiology Contact information: [address] [place] [address] [phone]        Sherrilyn Rist, MD. Schedule an appointment as soon as possible for a visit in 3 week(s).   Specialty: Gastroenterology Contact information: [address] [place] [address] [phone]           Consultations:  GI  Cardiology  Procedures/Studies:  2D echo  IMPRESSIONS    1. Left ventricular ejection fraction, by visual estimation, is 60 to  65%. The left ventricle has normal function. There is no left ventricular  hypertrophy.  2. The left ventricle has no regional wall motion abnormalities.  3. Global right ventricle has normal systolic function.The right  ventricular size is normal. No increase in right ventricular wall  thickness.  4. Left atrial size was normal.  5. Right atrial size was normal.  6. The mitral valve is normal in structure. Trivial mitral valve  regurgitation. No evidence of mitral stenosis.  7. The tricuspid valve is normal in structure.  8. The tricuspid valve is normal in structure. Tricuspid valve  regurgitation is not demonstrated.  9. The aortic valve is normal in structure. Aortic valve regurgitation is  not visualized. No evidence of aortic valve sclerosis or stenosis.  10. The pulmonic valve was normal in structure. Pulmonic valve  regurgitation is not visualized.   11. The inferior vena cava is dilated in size with >50% respiratory  variability, suggesting right atrial pressure of 8 mmHg.    EGD Impression: - Normal esophagus. - Non-bleeding gastric ulcers with no stigmata of  bleeding. Likely due to ibuprofen. Biopsied to  exclude H pylori. - Erythematous duodenopathy. Biopsied. - The examination was otherwise normal. Recommendation: - Clear liquid diet. Advance diet as hemoglobin  stabilizes. - Resume Plavix (clopidogrel) at prior dose today. - No aspirin, ibuprofen, naproxen, or other  non-steroidal anti-inflammatory drugs. - Pantoprazole 40 mg BID x 8 weeks. - Await pathology results. - Avoid all NSAIDs   CT Angio Chest PE W/Cm &/Or Wo Cm  Result Date: 08/03/2019 CLINICAL DATA:  Shortness of breath. EXAM: CT ANGIOGRAPHY CHEST WITH CONTRAST TECHNIQUE: Multidetector CT imaging of the chest was performed using the standard protocol during bolus administration of intravenous contrast. Multiplanar CT image reconstructions and MIPs were obtained to evaluate the vascular anatomy. CONTRAST:  80mL OMNIPAQUE IOHEXOL 350 MG/ML SOLN COMPARISON:  CT angiogram chest June 14, 2018 FINDINGS: Cardiovascular: There is no demonstrable pulmonary embolus. There is no thoracic aortic aneurysm or dissection. Visualized great vessels appear unremarkable. There are foci of aortic atherosclerosis. There are foci coronary artery calcification. There is no appreciable pericardial effusion or pericardial thickening. Mediastinum/Nodes: Visualized thyroid appears normal. There are scattered subcentimeter mediastinal lymph nodes. There is no  adenopathy by size criteria. No esophageal lesions are evident. Lungs/Pleura: There is underlying centrilobular emphysematous change. No edema or airspace opacity. No appreciable pleural effusions. Upper Abdomen: There is left adrenal hypertrophy, a finding also present previously. Visualized upper abdominal structures otherwise appear unremarkable. Musculoskeletal: There are no blastic or lytic bone lesions. No evident chest wall lesions. Review of the MIP images confirms the above findings. IMPRESSION: 1. No demonstrable pulmonary embolus. No thoracic aortic aneurysm or dissection. There is aortic atherosclerosis as well as foci of coronary artery calcification. 2. Underlying centrilobular emphysema. No edema or airspace opacity. 3.  No evident adenopathy. 4. Left adrenal hypertrophy, a finding of uncertain etiology or significance. Aortic Atherosclerosis (ICD10-I70.0) and Emphysema (ICD10-J43.9). Electronically Signed   By: Bretta Bang III M.D.   On: 08/03/2019 19:43   CT ABDOMEN PELVIS W CONTRAST  Result Date: 08/03/2019 CLINICAL DATA:  Abdominal pain and black tarry stools. EXAM: CT ABDOMEN AND PELVIS WITH CONTRAST TECHNIQUE: Multidetector CT imaging of the abdomen and pelvis was performed using the standard protocol following bolus administration of intravenous contrast. CONTRAST:  80mL OMNIPAQUE IOHEXOL 350 MG/ML SOLN COMPARISON:  06/03/2018 FINDINGS: Lower chest: The lung bases are clear of acute process. No pleural effusion or pulmonary lesions. The heart is normal in size. No pericardial effusion. The distal esophagus and aorta are unremarkable. Hepatobiliary: No focal hepatic lesions or intrahepatic biliary dilatation. The gallbladder is  surgically absent. No common bile duct dilatation. Pancreas: No mass, inflammation or ductal dilatation. Spleen: Normal size. No focal lesions. Adrenals/Urinary Tract: Stable left adrenal gland nodules, likely benign adenomas. The right adrenal gland is  unremarkable. Both kidneys are unremarkable. Stomach/Bowel: The stomach, duodenum, small bowel and colon are unremarkable. No acute inflammatory changes, mass lesions or obstructive findings. The terminal ileum appears normal. The appendix is surgically absent. There is a moderate amount of fluid throughout the colon with scattered air-fluid levels typically seen with diarrhea. I do not see any wall thickening or pericolonic inflammatory changes. Vascular/Lymphatic: Moderate atherosclerotic calcifications involving the aorta and iliac arteries but no aneurysm or dissection. The branch vessels are patent. The major venous structures are patent. No mesenteric or retroperitoneal mass or adenopathy. Reproductive: Surgically absent. Other: No pelvic mass or adenopathy. No free pelvic fluid collections. No inguinal mass or adenopathy. No abdominal wall hernia or subcutaneous lesions. Musculoskeletal: Scoliosis and degenerative lumbar spondylosis with multilevel disc disease and facet disease. No acute bony findings or bone lesions. IMPRESSION: 1. Moderate fluid and scattered air-fluid levels throughout the colon typically seen with diarrhea. No colonic wall thickening or pericolonic inflammatory changes. 2. No other acute abdominal/pelvic findings and no mass lesions or adenopathy. 3. Status post cholecystectomy. No biliary dilatation. 4. Stable left adrenal gland nodules, likely benign adenomas. Electronically Signed   By: Rudie Meyer M.D.   On: 08/03/2019 19:43   DG Chest Port 1 View  Result Date: 08/03/2019 CLINICAL DATA:  54 year old female right side chest pain. Bloody stools for 3 days. EXAM: PORTABLE CHEST 1 VIEW COMPARISON:  [facility] portable chest 09/03/2018 and earlier. FINDINGS: Portable AP upright view at 1703 hours. Attenuated bronchovascular markings in the lungs in keeping with emphysema demonstrated by CT on 12/01/2018. Stable lung volumes. Mediastinal contours are stable with some central  pulmonary artery enlargement. Visualized tracheal air column is within normal limits. Allowing for portable technique the lungs are clear. No acute osseous abnormality identified. IMPRESSION: 1.  No acute cardiopulmonary abnormality. 2. Emphysema (ICD10-J43.9). Chronic central pulmonary artery enlargement raising the possibility of pulmonary artery hypertension. Electronically Signed   By: Odessa Fleming M.D.   On: 08/03/2019 17:22   ECHOCARDIOGRAM COMPLETE  Result Date: 08/05/2019   ECHOCARDIOGRAM REPORT   Patient Name:   LUNA AUDIA Date of Exam: 08/05/2019 Medical Rec #:  [id number]    Height:       67.0 in Accession #:    [id number]   Weight:       203.8 lb Date of Birth:  08-Jun-1966     BSA:          2.04 m Patient Age:    53 years     BP:           159/84 mmHg Patient Gender: F            HR:           89 bpm. Exam Location:  Inpatient Procedure: 2D Echo Indications:    786.50 chest pain  History:        Patient has prior history of Echocardiogram examinations, most                 recent 06/15/2018. COPD; Risk Factors:Hypertension and Current                 Smoker.  Sonographer:    Celene Skeen RDCS (AE) Referring Phys: [id number] Corrin Parker  Sonographer Comments: Image acquisition challenging due to  patient body habitus. challenging windows IMPRESSIONS  1. Left ventricular ejection fraction, by visual estimation, is 60 to 65%. The left ventricle has normal function. There is no left ventricular hypertrophy.  2. The left ventricle has no regional wall motion abnormalities.  3. Global right ventricle has normal systolic function.The right ventricular size is normal. No increase in right ventricular wall thickness.  4. Left atrial size was normal.  5. Right atrial size was normal.  6. The mitral valve is normal in structure. Trivial mitral valve regurgitation. No evidence of mitral stenosis.  7. The tricuspid valve is normal in structure.  8. The tricuspid valve is normal in structure. Tricuspid valve  regurgitation is not demonstrated.  9. The aortic valve is normal in structure. Aortic valve regurgitation is not visualized. No evidence of aortic valve sclerosis or stenosis. 10. The pulmonic valve was normal in structure. Pulmonic valve regurgitation is not visualized. 11. The inferior vena cava is dilated in size with >50% respiratory variability, suggesting right atrial pressure of 8 mmHg. FINDINGS  Left Ventricle: Left ventricular ejection fraction, by visual estimation, is 60 to 65%. The left ventricle has normal function. The left ventricle has no regional wall motion abnormalities. There is no left ventricular hypertrophy. Left ventricular diastolic parameters were normal. Normal left atrial pressure. Right Ventricle: The right ventricular size is normal. No increase in right ventricular wall thickness. Global RV systolic function is has normal systolic function. Left Atrium: Left atrial size was normal in size. Right Atrium: Right atrial size was normal in size Pericardium: There is no evidence of pericardial effusion. Mitral Valve: The mitral valve is normal in structure. Trivial mitral valve regurgitation. No evidence of mitral valve stenosis by observation. Tricuspid Valve: The tricuspid valve is normal in structure. Tricuspid valve regurgitation is not demonstrated. Aortic Valve: The aortic valve is normal in structure. Aortic valve regurgitation is not visualized. The aortic valve is structurally normal, with no evidence of sclerosis or stenosis. Pulmonic Valve: The pulmonic valve was normal in structure. Pulmonic valve regurgitation is not visualized. Pulmonic regurgitation is not visualized. Aorta: The aortic root, ascending aorta and aortic arch are all structurally normal, with no evidence of dilitation or obstruction. Venous: The inferior vena cava is dilated in size with greater than 50% respiratory variability, suggesting right atrial pressure of 8 mmHg. IAS/Shunts: No atrial level shunt  detected by color flow Doppler. There is no evidence of a patent foramen ovale. No ventricular septal defect is seen or detected. There is no evidence of an atrial septal defect.  LEFT VENTRICLE PLAX 2D LVIDd:         3.30 cm  Diastology LVIDs:         2.30 cm  LV e' medial:   13.10 cm/s LV PW:         1.50 cm  LV E/e' medial: 6.7 LV IVS:        2.00 cm LVOT diam:     2.20 cm LV SV:         26 ml LV SV Index:   12.26 LVOT Area:     3.80 cm  RIGHT VENTRICLE RV S prime:     10.80 cm/s TAPSE (M-mode): 1.8 cm LEFT ATRIUM         Index LA diam:    4.00 cm 1.96 cm/m  AORTIC VALVE LVOT Vmax:   131.00 cm/s LVOT Vmean:  90.700 cm/s LVOT VTI:    0.357 m  AORTA Ao Root diam: 3.30 cm MITRAL  VALVE MV Area (PHT): 3.17 cm             SHUNTS MV PHT:        69.31 msec           Systemic VTI:  0.36 m MV Decel Time: 239 msec             Systemic Diam: 2.20 cm MV E velocity: 88.10 cm/s 103 cm/s MV A velocity: 66.20 cm/s 70.3 cm/s MV E/A ratio:  1.33       1.5  Chilton Siiffany Babbitt MD Electronically signed by Chilton Siiffany Canby MD Signature Date/Time: 08/05/2019/5:14:26 PM    Final       Subjective: - no chest pain, shortness of breath, no abdominal pain, nausea or vomiting.   Discharge Exam: BP (!) 153/88 (BP Location: Left Arm)   Pulse 65   Temp 97.9 F (36.6 C) (Oral)   Resp 16   Ht 5\' 7"  (1.702 m)   Wt 95.3 kg   SpO2 100%   BMI 32.91 kg/m   General: Pt is alert, awake, not in acute distress Cardiovascular: RRR, S1/S2 +, no rubs, no gallops Respiratory: CTA bilaterally, no wheezing, no rhonchi Abdominal: Soft, NT, ND, bowel sounds + Extremities: no edema, no cyanosis    The results of significant diagnostics from this hospitalization (including imaging, microbiology, ancillary and laboratory) are listed below for reference.     Microbiology: Recent Results (from the past 240 hour(s))  Urine culture     Status: Abnormal   Collection Time: 08/03/19  4:45 PM   Specimen: In/Out Cath Urine  Result Value Ref  Range Status   Specimen Description IN/OUT CATH URINE  Final   Special Requests   Final    NONE Performed at [facility], [address] [place], [place] [address]    Culture >=100,000 COLONIES/mL ENTEROCOCCUS FAECALIS (A)  Final   Report Status 08/05/2019 FINAL  Final   Organism ID, Bacteria ENTEROCOCCUS FAECALIS (A)  Final      Susceptibility   Enterococcus faecalis - MIC*    AMPICILLIN <=2 SENSITIVE Sensitive     NITROFURANTOIN <=16 SENSITIVE Sensitive     VANCOMYCIN 1 SENSITIVE Sensitive     * >=100,000 COLONIES/mL ENTEROCOCCUS FAECALIS  Respiratory Panel by RT PCR (Flu A&B, Covid) - Urine, Clean Catch     Status: None   Collection Time: 08/03/19  5:02 PM   Specimen: Urine, Clean Catch  Result Value Ref Range Status   SARS Coronavirus 2 by RT PCR NEGATIVE NEGATIVE Final    Comment: (NOTE) SARS-CoV-2 target nucleic acids are NOT DETECTED. The SARS-CoV-2 RNA is generally detectable in upper respiratoy specimens during the acute phase of infection. The lowest concentration of SARS-CoV-2 viral copies this assay can detect is 131 copies/mL. A negative result does not preclude SARS-Cov-2 infection and should not be used as the sole basis for treatment or other patient management decisions. A negative result may occur with  improper specimen collection/handling, submission of specimen other than nasopharyngeal swab, presence of viral mutation(s) within the areas targeted by this assay, and inadequate number of viral copies (<131 copies/mL). A negative result must be combined with clinical observations, patient history, and epidemiological information. The expected result is Negative. Fact Sheet for Patients:  https://www.moore.com/https://www.fda.gov/media/142436/download Fact Sheet for Healthcare Providers:  https://www.young.biz/https://www.fda.gov/media/142435/download This test is not yet ap proved or cleared by the [place] FDA and  has been authorized for detection and/or diagnosis of SARS-CoV-2 by FDA  under an  Emergency Use Authorization (EUA). This EUA will remain  in effect (meaning this test can be used) for the duration of the COVID-19 declaration under Section 564(b)(1) of the Act, 21 U.S.C. section 360bbb-3(b)(1), unless the authorization is terminated or revoked sooner.    Influenza A by PCR NEGATIVE NEGATIVE Final   Influenza B by PCR NEGATIVE NEGATIVE Final    Comment: (NOTE) The Xpert Xpress SARS-CoV-2/FLU/RSV assay is intended as an aid in  the diagnosis of influenza from Nasopharyngeal swab specimens and  should not be used as a sole basis for treatment. Nasal washings and  aspirates are unacceptable for Xpert Xpress SARS-CoV-2/FLU/RSV  testing. Fact Sheet for Patients: https://www.moore.com/ Fact Sheet for Healthcare Providers: https://www.young.biz/ This test is not yet approved or cleared by the [place] FDA and  has been authorized for detection and/or diagnosis of SARS-CoV-2 by  FDA under an Emergency Use Authorization (EUA). This EUA will remain  in effect (meaning this test can be used) for the duration of the  Covid-19 declaration under Section 564(b)(1) of the Act, 21  U.S.C. section 360bbb-3(b)(1), unless the authorization is  terminated or revoked. Performed at [facility], [address] [place], [place] [address]   Blood Culture (routine x 2)     Status: None (Preliminary result)   Collection Time: 08/03/19  5:25 PM   Specimen: BLOOD  Result Value Ref Range Status   Specimen Description BLOOD SITE NOT SPECIFIED  Final   Special Requests   Final    BOTTLES DRAWN AEROBIC AND ANAEROBIC Blood Culture adequate volume   Culture   Final    NO GROWTH 3 DAYS Performed at [facility], [address], [place], [place] [address]    Report Status PENDING  Incomplete  Blood Culture (routine x 2)     Status: None (Preliminary result)   Collection Time: 08/03/19  9:40 PM   Specimen: BLOOD  Result Value  Ref Range Status   Specimen Description BLOOD RIGHT ANTECUBITAL  Final   Special Requests   Final    BOTTLES DRAWN AEROBIC AND ANAEROBIC Blood Culture adequate volume   Culture   Final    NO GROWTH 3 DAYS Performed at [facility], [address] [place], [place] [address]    Report Status PENDING  Incomplete     Labs: Basic Metabolic Panel: Recent Labs  Lab 08/03/19 1721 08/03/19 1725 08/04/19 0944 08/04/19 1250 08/05/19 0435 08/06/19 0527  NA  --  141 142  --  140 137  K  --  2.1* 2.9* 3.2* 4.3 4.7  CL  --  94* 108  --  109 108  CO2  --  32 27  --  24 20*  GLUCOSE  --  148* 105*  --  90 97  BUN  --  58* 31*  --  20 13  CREATININE  --  1.55* 1.03*  --  0.86 0.89  CALCIUM  --  8.9 7.3*  --  8.2* 8.5*  MG 3.6*  --  2.9*  --   --   --    Liver Function Tests: Recent Labs  Lab 08/03/19 1725  AST 20  ALT 16  ALKPHOS 51  BILITOT 0.9  PROT 5.5*  ALBUMIN 3.2*   CBC: Recent Labs  Lab 08/03/19 1527 08/03/19 2355 08/04/19 1250 08/05/19 0435 08/06/19 0853  WBC 21.0*  --   --  9.0 10.8*  HGB 10.3* 7.3* 9.3* 8.3* 9.1*  HCT 30.8*  22.6* 28.9* 25.8* 29.4*  MCV 93.3  --   --  94.5 99.0  PLT 249  --   --  99* 100*   CBG: No results for input(s): GLUCAP in the last 168 hours. Hgb A1c No results for input(s): HGBA1C in the last 72 hours. Lipid Profile No results for input(s): CHOL, HDL, LDLCALC, TRIG, CHOLHDL, LDLDIRECT in the last 72 hours. Thyroid function studies No results for input(s): TSH, T4TOTAL, T3FREE, THYROIDAB in the last 72 hours.  Invalid input(s): FREET3 Urinalysis    Component Value Date/Time   COLORURINE YELLOW 08/03/2019 1645   APPEARANCEUR HAZY (A) 08/03/2019 1645   LABSPEC 1.020 08/03/2019 1645   PHURINE 6.0 08/03/2019 1645   GLUCOSEU NEGATIVE 08/03/2019 1645   HGBUR MODERATE (A) 08/03/2019 1645   BILIRUBINUR NEGATIVE 08/03/2019 1645   KETONESUR NEGATIVE 08/03/2019 1645   PROTEINUR NEGATIVE 08/03/2019 1645   NITRITE NEGATIVE  08/03/2019 1645   LEUKOCYTESUR NEGATIVE 08/03/2019 1645    FURTHER DISCHARGE INSTRUCTIONS:   Get Medicines reviewed and adjusted: Please take all your medications with you for your next visit with your Primary MD   Laboratory/radiological data: Please request your Primary MD to go over all hospital tests and procedure/radiological results at the follow up, please ask your Primary MD to get all Hospital records sent to his/her office.   In some cases, they will be blood work, cultures and biopsy results pending at the time of your discharge. Please request that your primary care M.D. goes through all the records of your hospital data and follows up on these results.   Also Note the following: If you experience worsening of your admission symptoms, develop shortness of breath, life threatening emergency, suicidal or homicidal thoughts you must seek medical attention immediately by calling 911 or calling your MD immediately  if symptoms less severe.   You must read complete instructions/literature along with all the possible adverse reactions/side effects for all the Medicines you take and that have been prescribed to you. Take any new Medicines after you have completely understood and accpet all the possible adverse reactions/side effects.    Do not drive when taking Pain medications or sleeping medications (Benzodaizepines)   Do not take more than prescribed Pain, Sleep and Anxiety Medications. It is not advisable to combine anxiety,sleep and pain medications without talking with your primary care practitioner   Special Instructions: If you have smoked or chewed Tobacco  in the last 2 yrs please stop smoking, stop any regular Alcohol  and or any Recreational drug use.   Wear Seat belts while driving.   Please note: You were cared for by a hospitalist during your hospital stay. Once you are discharged, your primary care physician will handle any further medical issues. Please note that NO  REFILLS for any discharge medications will be authorized once you are discharged, as it is imperative that you return to your primary care physician (or establish a relationship with a primary care physician if you do not have one) for your post hospital discharge needs so that they can reassess your need for medications and monitor your lab values.  Time coordinating discharge: 40 minutes  SIGNED:  Pamella Pertostin Khyren Hing, MD, PhD 08/06/2019, 11:24 AM

## 2019-08-06 NOTE — Discharge Instructions (Signed)
Follow with Eloisa Northern, MD in 2-3 weeks  Please get a complete blood count and chemistry panel checked by your Primary MD at your next visit, and again as instructed by your Primary MD. Please get your medications reviewed and adjusted by your Primary MD.  Please request your Primary MD to go over all Hospital Tests and Procedure/Radiological results at the follow up, please get all Hospital records sent to your Prim MD by signing hospital release before you go home.  In some cases, there will be blood work, cultures and biopsy results pending at the time of your discharge. Please request that your primary care M.D. goes through all the records of your hospital data and follows up on these results.  If you had Pneumonia of Lung problems at the Hospital: Please get a 2 view Chest X ray done in 6-8 weeks after hospital discharge or sooner if instructed by your Primary MD.  If you have Congestive Heart Failure: Please call your Cardiologist or Primary MD anytime you have any of the following symptoms:  1) 3 pound weight gain in 24 hours or 5 pounds in 1 week  2) shortness of breath, with or without a dry hacking cough  3) swelling in the hands, feet or stomach  4) if you have to sleep on extra pillows at night in order to breathe  Follow cardiac low salt diet and 1.5 lit/day fluid restriction.  If you have diabetes Accuchecks 4 times/day, Once in AM empty stomach and then before each meal. Log in all results and show them to your primary doctor at your next visit. If any glucose reading is under 80 or above 300 call your primary MD immediately.  If you have Seizure/Convulsions/Epilepsy: Please do not drive, operate heavy machinery, participate in activities at heights or participate in high speed sports until you have seen by Primary MD or a Neurologist and advised to do so again. Per [facility] statutes, patients with seizures are not allowed to drive until they have been  seizure-free for six months.  Use caution when using heavy equipment or power tools. Avoid working on ladders or at heights. Take showers instead of baths. Ensure the water temperature is not too high on the home water heater. Do not go swimming alone. Do not lock yourself in a room alone (i.e. bathroom). When caring for infants or small children, sit down when holding, feeding, or changing them to minimize risk of injury to the child in the event you have a seizure. Maintain good sleep hygiene. Avoid alcohol.   If you had Gastrointestinal Bleeding: Please ask your Primary MD to check a complete blood count within one week of discharge or at your next visit. Your endoscopic/colonoscopic biopsies that are pending at the time of discharge, will also need to followed by your Primary MD.  Get Medicines reviewed and adjusted. Please take all your medications with you for your next visit with your Primary MD  Please request your Primary MD to go over all hospital tests and procedure/radiological results at the follow up, please ask your Primary MD to get all Hospital records sent to his/her office.  If you experience worsening of your admission symptoms, develop shortness of breath, life threatening emergency, suicidal or homicidal thoughts you must seek medical attention immediately by calling 911 or calling your MD immediately  if symptoms less severe.  You must read complete instructions/literature along with all the possible adverse reactions/side effects for all the Medicines you take  and that have been prescribed to you. Take any new Medicines after you have completely understood and accpet all the possible adverse reactions/side effects.   Do not drive or operate heavy machinery when taking Pain medications.   Do not take more than prescribed Pain, Sleep and Anxiety Medications  Special Instructions: If you have smoked or chewed Tobacco  in the last 2 yrs please stop smoking, stop any regular  Alcohol  and or any Recreational drug use.  Wear Seat belts while driving.  Please note You were cared for by a hospitalist during your hospital stay. If you have any questions about your discharge medications or the care you received while you were in the hospital after you are discharged, you can call the unit and asked to speak with the hospitalist on call if the hospitalist that took care of you is not available. Once you are discharged, your primary care physician will handle any further medical issues. Please note that NO REFILLS for any discharge medications will be authorized once you are discharged, as it is imperative that you return to your primary care physician (or establish a relationship with a primary care physician if you do not have one) for your aftercare needs so that they can reassess your need for medications and monitor your lab values.  You can reach the hospitalist office at phone [phone] or fax [phone]   If you do not have a primary care physician, you can call [phone] for a physician referral.  Activity: As tolerated with Full fall precautions use walker/cane & assistance as needed    Diet: regular  Disposition Home

## 2019-08-06 NOTE — Progress Notes (Signed)
Progress Note  Patient Name: Bethany Stuart Date of Encounter: 08/06/2019  Primary Cardiologist: Berniece Salines, DO   Subjective   Continues to improve, no chest pain, no shortness of breath  Inpatient Medications    Scheduled Meds: . atorvastatin  40 mg Oral Daily  . carvedilol  25 mg Oral BID  . clopidogrel  75 mg Oral Daily  . fluticasone furoate-vilanterol  1 puff Inhalation Daily   And  . umeclidinium bromide  1 puff Inhalation Daily  . potassium chloride  40 mEq Oral BID  . sodium chloride flush  3 mL Intravenous Q12H  . traZODone  100 mg Oral QHS   Continuous Infusions: . pantoprozole (PROTONIX) infusion 8 mg/hr (08/04/19 0801)   PRN Meds: acetaminophen **OR** acetaminophen, ALPRAZolam, fentaNYL (SUBLIMAZE) injection, ipratropium-albuterol, oxyCODONE-acetaminophen   Vital Signs    Vitals:   08/06/19 0456 08/06/19 0457 08/06/19 0831 08/06/19 0842  BP:  (!) 174/87 (!) 153/88   Pulse:  69 65 65  Resp:  20  16  Temp:  97.9 F (36.6 C)    TempSrc:  Oral    SpO2:  100%  100%  Weight: 95.3 kg     Height:        Intake/Output Summary (Last 24 hours) at 08/06/2019 1015 Last data filed at 08/06/2019 0900 Gross per 24 hour  Intake 1410 ml  Output 50 ml  Net 1360 ml   Last 3 Weights 08/06/2019 08/05/2019 08/04/2019  Weight (lbs) 210 lb 1.6 oz 203 lb 12.8 oz 196 lb 8 oz  Weight (kg) 95.301 kg 92.443 kg 89.132 kg      Telemetry    Normal sinus rhythm with rates in the 60's to 80's. - Personally Reviewed, no changes  ECG    No new ECG tracing today. - Personally Reviewed  Physical Exam   GEN: Well nourished, well developed, in no acute distress  HEENT: normal  Neck: no JVD, carotid bruits, or masses Cardiac: RRR; no murmurs, rubs, or gallops,no edema  Respiratory:  clear to auscultation bilaterally, normal work of breathing GI: soft, nontender, nondistended, + BS MS: no deformity or atrophy  Skin: warm and dry, no rash Neuro:  Alert and Oriented x 3,  Strength and sensation are intact Psych: euthymic mood, full affect   Labs    High Sensitivity Troponin:   Recent Labs  Lab 08/03/19 1725 08/03/19 2115 08/03/19 2355  TROPONINIHS 155* 182* 134*      Chemistry Recent Labs  Lab 08/03/19 1725 08/03/19 1725 08/04/19 0944 08/04/19 0944 08/04/19 1250 08/05/19 0435 08/06/19 0527  NA 141   < > 142  --   --  140 137  K 2.1*   < > 2.9*   < > 3.2* 4.3 4.7  CL 94*   < > 108  --   --  109 108  CO2 32   < > 27  --   --  24 20*  GLUCOSE 148*   < > 105*  --   --  90 97  BUN 58*   < > 31*  --   --  20 13  CREATININE 1.55*   < > 1.03*  --   --  0.86 0.89  CALCIUM 8.9   < > 7.3*  --   --  8.2* 8.5*  PROT 5.5*  --   --   --   --   --   --   ALBUMIN 3.2*  --   --   --   --   --   --  AST 20  --   --   --   --   --   --   ALT 16  --   --   --   --   --   --   ALKPHOS 51  --   --   --   --   --   --   BILITOT 0.9  --   --   --   --   --   --   GFRNONAA 38*   < > >60  --   --  >60 >60  GFRAA 44*   < > >60  --   --  >60 >60  ANIONGAP 15   < > 7  --   --  7 9   < > = values in this interval not displayed.     Hematology Recent Labs  Lab 08/03/19 1527 08/03/19 1527 08/03/19 2355 08/04/19 1250 08/05/19 0435  WBC 21.0*  --   --   --  9.0  RBC 3.30*  --   --   --  2.73*  HGB 10.3*   < > 7.3* 9.3* 8.3*  HCT 30.8*   < > 22.6* 28.9* 25.8*  MCV 93.3  --   --   --  94.5  MCH 31.2  --   --   --  30.4  MCHC 33.4  --   --   --  32.2  RDW 16.1*  --   --   --  17.8*  PLT 249  --   --   --  99*   < > = values in this interval not displayed.    BNPNo results for input(s): BNP, PROBNP in the last 168 hours.   DDimer No results for input(s): DDIMER in the last 168 hours.   Radiology    ECHOCARDIOGRAM COMPLETE  Result Date: 08/05/2019   ECHOCARDIOGRAM REPORT   Patient Name:   Bethany Stuart Date of Exam: 08/05/2019 Medical Rec #:  [id number]    Height:       67.0 in Accession #:    [id number]   Weight:       203.8 lb Date of Birth:  1966-07-07      BSA:          2.04 m Patient Age:    53 years     BP:           159/84 mmHg Patient Gender: F            HR:           89 bpm. Exam Location:  Inpatient Procedure: 2D Echo Indications:    786.50 chest pain  History:        Patient has prior history of Echocardiogram examinations, most                 recent 06/15/2018. COPD; Risk Factors:Hypertension and Current                 Smoker.  Sonographer:    Celene Skeen RDCS (AE) Referring Phys: [id number] Corrin Parker  Sonographer Comments: Image acquisition challenging due to patient body habitus. challenging windows IMPRESSIONS  1. Left ventricular ejection fraction, by visual estimation, is 60 to 65%. The left ventricle has normal function. There is no left ventricular hypertrophy.  2. The left ventricle has no regional wall motion abnormalities.  3. Global right ventricle has normal systolic function.The right ventricular size is normal. No increase in right ventricular  wall thickness.  4. Left atrial size was normal.  5. Right atrial size was normal.  6. The mitral valve is normal in structure. Trivial mitral valve regurgitation. No evidence of mitral stenosis.  7. The tricuspid valve is normal in structure.  8. The tricuspid valve is normal in structure. Tricuspid valve regurgitation is not demonstrated.  9. The aortic valve is normal in structure. Aortic valve regurgitation is not visualized. No evidence of aortic valve sclerosis or stenosis. 10. The pulmonic valve was normal in structure. Pulmonic valve regurgitation is not visualized. 11. The inferior vena cava is dilated in size with >50% respiratory variability, suggesting right atrial pressure of 8 mmHg. FINDINGS  Left Ventricle: Left ventricular ejection fraction, by visual estimation, is 60 to 65%. The left ventricle has normal function. The left ventricle has no regional wall motion abnormalities. There is no left ventricular hypertrophy. Left ventricular diastolic parameters were normal. Normal  left atrial pressure. Right Ventricle: The right ventricular size is normal. No increase in right ventricular wall thickness. Global RV systolic function is has normal systolic function. Left Atrium: Left atrial size was normal in size. Right Atrium: Right atrial size was normal in size Pericardium: There is no evidence of pericardial effusion. Mitral Valve: The mitral valve is normal in structure. Trivial mitral valve regurgitation. No evidence of mitral valve stenosis by observation. Tricuspid Valve: The tricuspid valve is normal in structure. Tricuspid valve regurgitation is not demonstrated. Aortic Valve: The aortic valve is normal in structure. Aortic valve regurgitation is not visualized. The aortic valve is structurally normal, with no evidence of sclerosis or stenosis. Pulmonic Valve: The pulmonic valve was normal in structure. Pulmonic valve regurgitation is not visualized. Pulmonic regurgitation is not visualized. Aorta: The aortic root, ascending aorta and aortic arch are all structurally normal, with no evidence of dilitation or obstruction. Venous: The inferior vena cava is dilated in size with greater than 50% respiratory variability, suggesting right atrial pressure of 8 mmHg. IAS/Shunts: No atrial level shunt detected by color flow Doppler. There is no evidence of a patent foramen ovale. No ventricular septal defect is seen or detected. There is no evidence of an atrial septal defect.  LEFT VENTRICLE PLAX 2D LVIDd:         3.30 cm  Diastology LVIDs:         2.30 cm  LV e' medial:   13.10 cm/s LV PW:         1.50 cm  LV E/e' medial: 6.7 LV IVS:        2.00 cm LVOT diam:     2.20 cm LV SV:         26 ml LV SV Index:   12.26 LVOT Area:     3.80 cm  RIGHT VENTRICLE RV S prime:     10.80 cm/s TAPSE (M-mode): 1.8 cm LEFT ATRIUM         Index LA diam:    4.00 cm 1.96 cm/m  AORTIC VALVE LVOT Vmax:   131.00 cm/s LVOT Vmean:  90.700 cm/s LVOT VTI:    0.357 m  AORTA Ao Root diam: 3.30 cm MITRAL VALVE MV Area  (PHT): 3.17 cm             SHUNTS MV PHT:        69.31 msec           Systemic VTI:  0.36 m MV Decel Time: 239 msec  Systemic Diam: 2.20 cm MV E velocity: 88.10 cm/s 103 cm/s MV A velocity: 66.20 cm/s 70.3 cm/s MV E/A ratio:  1.33       1.5  Chilton Siiffany Drumright MD Electronically signed by Chilton Siiffany Talty MD Signature Date/Time: 08/05/2019/5:14:26 PM    Final     Cardiac Studies   Left Heart Catheterization 06/29/2019:  Right dominant coronary anatomy.  40 to 60% mid RCA eccentric stenosis, unchanged/improved when compared to 1 year ago.  Normal left main  Widely patent LAD  Normal circumflex  Hyperdynamic left ventricle.  EF 65% or greater.  LVEDP 14 mmHg.  No intracavitary gradient recorded.  RECOMMENDATIONS:  Stable anatomy when compared to prior.  We encouraged risk factor modification, especially smoking cessation.  We encouraged compliance with antihypertensive regimen.  Patient Profile   Ms. Moroz is a 54 y.o. 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 Cardiology asked to see for evaluation of chest pain and elevated troponin.   Assessment & Plan    Demand Ischemia - Patient presented with abdominal pain and frequent melena but also endorsed chest pain that was both pleuritic and exertional in nature.  - High-sensitivity troponin mildly elevated and flat at 155 > 182 >> 134.  - EKG shows diffuse ST depression which has been seen on prior EKGs. - Patient recently had cardiac catheterization on 06/29/2019 which showed 40-60% mid RCA stenosis (unchanged from cath in 2019).  -Echocardiogram on this admission ejection fraction 60%. - ACS unlikely. Suspect demand ischemia in setting of anemia. Likely  exacerbated by LVOT obstruction in setting of hypovolemia and tachycardia.  - Aspirin and Plavix currently on hold due to anemia. OK to restart Plavix per GI, I would not be opposed to restarting this later as an outpatient if felt necessary.  Looks like this is back on her med list.   - Continue statin.  Possible LVOT - During [facility] secondary to [facility], Echo was reportedly concerning for LVOT but was a technically difficult study.  -Echo here does not show any evidence of outflow tract obstruction  Hypertension -  BP initially soft but has since improved. Most recent BP 153/80. - Home Coreg, Diltiazem, and HCTZ held on admission due to soft BP and concern for hypovolemia.  -BP continues to be elevated despite reintroducing carvedilol 25 mg twice a day. -I will add amlodipine 5 mg once a day instead of diltiazem as she was taking at home to assist with her blood pressure.  Anemia with Suspected Acute Upper GI Bleed - Hemoglobin 10.3 on admission, down from 12.4 on 06/25/2019. Received 1 unit of PRBC. - GI consulted and EGD showed non bleeding gastric ulcers.  - PPI, avoid ASA, NSAIDS  - OK for PLAVIX per GI.   -Last hemoglobin  8.3  Hypokalemia - Potassium 2.1 on admission.  - Now 4.7, I will stop her potassium supplementation.  Prolonged QTc  - QTc 501 ms on EKG  - Potassium was 2.1.  Repleted. - Magnesium 3.6   - no arrhythmias  AKI  - Creatinine 1.55 on admission. Up form 0.82 in 06/2019. - Likely pre-renal due to hypovolemia. - Received IV fluids. - Repeat BMET 0.89. Excellent  Otherwise, per primary team.  - COPD - SIRS - Hypermagnesemia  -  No changes  Okay for discharge from cardiology perspective. We will sign off.  She will have follow-up with Dr. Servando Salina.  No need for outpatient echocardiogram since we did it here.  For questions or updates, please contact [facility] Please consult [email or web address] for contact info  under        Signed, Donato Schultz, MD  08/06/2019, 10:15 AM

## 2019-08-07 LAB — GI PATHOGEN PANEL BY PCR, STOOL

## 2019-08-08 LAB — CULTURE, BLOOD (ROUTINE X 2)
Culture: NO GROWTH
Culture: NO GROWTH
Special Requests: ADEQUATE
Special Requests: ADEQUATE

## 2019-08-10 ENCOUNTER — Encounter: Payer: Self-pay | Admitting: Anesthesiology

## 2019-08-10 NOTE — Anesthesia Postprocedure Evaluation (Signed)
Anesthesia Post Note  Patient: Bethany Stuart  Procedure(s) Performed: ESOPHAGOGASTRODUODENOSCOPY (EGD) (N/A ) BIOPSY     Patient location during evaluation: Endoscopy Anesthesia Type: MAC Level of consciousness: awake and alert Pain management: pain level controlled Vital Signs Assessment: post-procedure vital signs reviewed and stable Respiratory status: spontaneous breathing, nonlabored ventilation, respiratory function stable and patient connected to nasal cannula oxygen Cardiovascular status: stable and blood pressure returned to baseline Postop Assessment: no apparent nausea or vomiting Anesthetic complications: no    Last Vitals:  Vitals:   08/06/19 0842 08/06/19 1249  BP:  (!) 163/94  Pulse: 65 69  Resp: 16 19  Temp:  37.2 C  SpO2: 100% 100%    Last Pain:  Vitals:   08/06/19 1249  TempSrc: Oral  PainSc:                  [name]

## 2019-08-18 ENCOUNTER — Other Ambulatory Visit: Payer: Self-pay

## 2019-08-18 ENCOUNTER — Telehealth: Payer: Medicaid Other | Admitting: Cardiology

## 2019-08-23 ENCOUNTER — Ambulatory Visit: Payer: Medicaid Other

## 2019-09-06 ENCOUNTER — Telehealth: Payer: Self-pay | Admitting: *Deleted

## 2019-09-06 NOTE — Telephone Encounter (Signed)
Patient returned your call I advised her of appt request for mid April she said ok she will call back to schedule.

## 2019-09-06 NOTE — Telephone Encounter (Signed)
-----   Message from West Carbo, RN sent at 08/05/2019  1:38 PM EST ----- Regarding: Schedule EGD Schedule repeat EGD around 11/03/19 (3 months per Orvan Falconer)  This will be sent out in 1 month.

## 2019-09-06 NOTE — Telephone Encounter (Signed)
Attempted to call the patient to get the patient scheduled for repeat EGD. Unable to LM, VM not set up yet.

## 2019-09-21 ENCOUNTER — Ambulatory Visit (INDEPENDENT_AMBULATORY_CARE_PROVIDER_SITE_OTHER): Payer: Medicaid Other | Admitting: Cardiology

## 2019-09-21 ENCOUNTER — Encounter (INDEPENDENT_AMBULATORY_CARE_PROVIDER_SITE_OTHER): Payer: Self-pay

## 2019-09-21 ENCOUNTER — Other Ambulatory Visit: Payer: Self-pay | Admitting: Cardiology

## 2019-09-21 ENCOUNTER — Encounter: Payer: Self-pay | Admitting: Cardiology

## 2019-09-21 ENCOUNTER — Other Ambulatory Visit: Payer: Self-pay

## 2019-09-21 VITALS — BP 140/60 | HR 75 | Ht 67.0 in | Wt 194.0 lb

## 2019-09-21 DIAGNOSIS — R0600 Dyspnea, unspecified: Secondary | ICD-10-CM

## 2019-09-21 DIAGNOSIS — I2510 Atherosclerotic heart disease of native coronary artery without angina pectoris: Secondary | ICD-10-CM | POA: Diagnosis not present

## 2019-09-21 DIAGNOSIS — I509 Heart failure, unspecified: Secondary | ICD-10-CM

## 2019-09-21 DIAGNOSIS — R600 Localized edema: Secondary | ICD-10-CM | POA: Diagnosis not present

## 2019-09-21 DIAGNOSIS — I10 Essential (primary) hypertension: Secondary | ICD-10-CM

## 2019-09-21 MED ORDER — FUROSEMIDE 40 MG PO TABS: 40.0000 mg | ORAL_TABLET | Freq: Every day | ORAL | 1 refills | Status: DC

## 2019-09-21 MED ORDER — POTASSIUM CHLORIDE CRYS ER 20 MEQ PO TBCR: 20.0000 meq | EXTENDED_RELEASE_TABLET | Freq: Every day | ORAL | 1 refills | Status: DC

## 2019-09-21 NOTE — Progress Notes (Signed)
Cardiology Office Note:    Date:  09/21/2019   ID:  Bethany Stuart, DOB September 20, 1965, MRN [id number]  PCP:  Eloisa Northern, MD  Cardiologist:  Thomasene Ripple, DO  Electrophysiologist:  None   Referring MD: Eloisa Northern, MD   Chief Complaint  Patient presents with  . Follow-up  follow up of worsening leg edema and shortness of breath  History of Present Illness:    Bethany Stuart a 54 y.o.femalewith a hx ofmoderate nonobstructive coronary artery disease with her most recent left heart cath in December 2020 with no indication for PCI, diastolic heart failure, GERD, history of LVOT obstruction with a gradient of 146 mmHg on echo performed in November 2018. Subsequently in December 2019 and January 2021 she had an echocardiogram performed with no noted LVOT obstruction.   Since her last visit in July 08, 2019 the patient has had an hospitalization at [facility] where she was treated for acute GI bleeding.  She underwent an upper endoscopy where she was noted to have nonbleeding gastric ulcers with no stigmata of recent bleeding which was noted to likely be due to ibuprofen.  Prior to the hospitalization her aspirin was stopped she was placed on Protonix twice daily for 8 weeks.  Her Plavix was resumed.  Today she tells me since her discharge on August 06, 2019 she has been doing well.  Her follow-up labs stable.  She has had worsening bilateral leg edema and shortness of breath.  She denies any chest pain.   Past Medical History:  Diagnosis Date  . Acute on chronic respiratory failure with hypoxia (HCC)   . Acute upper GI bleed 08/03/2019  . AKI (acute kidney injury) (HCC)   . Arthritis    Back  . Bulging lumbar disc   . Chest pain 06/14/2018  . COPD (chronic obstructive pulmonary disease) (HCC)   . Coronary artery disease involving native coronary artery of native heart without angina pectoris 06/24/2019  . Depression   . Essential hypertension 06/24/2019  . Gastric ulcer   .  Hypertension   . Hypokalemia 06/15/2018  . Hypomagnesemia 06/15/2018  . 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 Prolonged QT interval   . Sciatica   . SIRS (systemic inflammatory response syndrome) (HCC)   . Syncope 06/24/2019    Past Surgical History:  Procedure Laterality Date  . ABDOMINAL HYSTERECTOMY    . APPENDECTOMY    . BIOPSY  08/04/2019   Procedure: BIOPSY;  Surgeon: Tressia Danas, MD;  Location: [facility] ENDOSCOPY;  Service: Gastroenterology;;  . CHOLECYSTECTOMY    . ESOPHAGOGASTRODUODENOSCOPY N/A 08/04/2019   Procedure: ESOPHAGOGASTRODUODENOSCOPY (EGD);  Surgeon: Tressia Danas, MD;  Location: [facility] ENDOSCOPY;  Service: Gastroenterology;  Laterality: N/A;  . FOOT SURGERY    . INTRAVASCULAR PRESSURE WIRE/FFR STUDY N/A 06/15/2018   Procedure: INTRAVASCULAR PRESSURE WIRE/FFR STUDY;  Surgeon: Elder Negus, MD;  Location: [facility] INVASIVE CV LAB;  Service: Cardiovascular;  Laterality: N/A;  . KNEE SURGERY Right   . LEFT HEART CATH AND CORONARY ANGIOGRAPHY N/A 06/15/2018   Procedure: LEFT HEART CATH AND CORONARY ANGIOGRAPHY;  Surgeon: Elder Negus, MD;  Location: [facility] INVASIVE CV LAB;  Service: Cardiovascular;  Laterality: N/A;  . LEFT HEART CATH AND CORONARY ANGIOGRAPHY N/A 06/29/2019   Procedure: LEFT HEART CATH AND CORONARY ANGIOGRAPHY;  Surgeon: Lyn Records, MD;  Location: [facility] INVASIVE CV LAB;  Service: Cardiovascular;  Laterality: N/A;    Current Medications: Current Meds  Medication Sig  . ALPRAZolam (XANAX) 0.5 MG  tablet Take 0.5 mg by mouth daily as needed for anxiety.  Marland Kitchen atorvastatin (LIPITOR) 40 MG tablet Take 40 mg by mouth daily.  . carvedilol (COREG) 25 MG tablet Take 1 tablet (25 mg total) by mouth 2 (two) times daily.  . clopidogrel (PLAVIX) 75 MG tablet Take 1 tablet (75 mg total) by mouth daily. Continue aspirin and Plavix together for 1 year (Patient taking differently: Take 75 mg by mouth daily. )  . FLUoxetine (PROZAC) 20 MG tablet Take  20 mg by mouth daily.  . Fluticasone-Umeclidin-Vilant (TRELEGY ELLIPTA) 100-62.5-25 MCG/INH AEPB Inhale 1 puff into the lungs daily.  Marland Kitchen ipratropium-albuterol (DUONEB) 0.5-2.5 (3) MG/3ML SOLN Take 3 mLs by nebulization every 4 (four) hours as needed (for shortness of breath).   . pantoprazole (PROTONIX) 40 MG tablet Take 1 tablet (40 mg total) by mouth 2 (two) times daily before a meal.  . traZODone (DESYREL) 100 MG tablet Take 100 mg by mouth at bedtime.  . [DISCONTINUED] amLODipine (NORVASC) 5 MG tablet Take 1 tablet (5 mg total) by mouth daily.  . [DISCONTINUED] hydrochlorothiazide (HYDRODIURIL) 25 MG tablet Take 1 tablet (25 mg total) by mouth daily.     Allergies:   Fioricet [butalbital-apap-caffeine], Klonopin [clonazepam], and Penicillins   Social History   Socioeconomic History  . Marital status: Single    Spouse name: Not on file  . Number of children: Not on file  . Years of education: Not on file  . Highest education level: Not on file  Occupational History  . Not on file  Tobacco Use  . Smoking status: Current Every Day Smoker  . Smokeless tobacco: Never Used  Substance and Sexual Activity  . Alcohol use: Yes  . Drug use: No  . Sexual activity: Not on file  Other Topics Concern  . Not on file  Social History Narrative  . Not on file   Social Determinants of Health   Financial Resource Strain:   . Difficulty of Paying Living Expenses:   Food Insecurity:   . Worried About Programme researcher, broadcasting/film/video in the Last Year:   . Barista in the Last Year:   Transportation Needs:   . Freight forwarder (Medical):   Marland Kitchen Lack of Transportation (Non-Medical):   Physical Activity:   . Days of Exercise per Week:   . Minutes of Exercise per Session:   Stress:   . Feeling of Stress :   Social Connections:   . Frequency of Communication with Friends and Family:   . Frequency of Social Gatherings with Friends and Family:   . Attends Religious Services:   . Active Member of  Clubs or Organizations:   . Attends Banker Meetings:   Marland Kitchen Marital Status:      Family History: The patient's family history includes CAD in her mother; Diabetes in her maternal grandmother; Emphysema in her father; Fibromyalgia in her sister and sister; Heart attack in her brother and maternal grandfather; Hypertension in her mother; Pneumonia in her mother.  ROS:   Review of Systems  Constitution: Negative for decreased appetite, fever and weight gain.  HENT: Negative for congestion, ear discharge, hoarse voice and sore throat.   Eyes: Negative for discharge, redness, vision loss in right eye and visual halos.  Cardiovascular: Reports shortness of breath and exertion.  Negative for chest pain,leg swelling, orthopnea and palpitations.  Respiratory: Negative for cough, hemoptysis, shortness of breath and snoring.   Endocrine: Negative for heat intolerance and polyphagia.  Hematologic/Lymphatic: Negative for bleeding problem. Does not bruise/bleed easily.  Skin: Negative for flushing, nail changes, rash and suspicious lesions.  Musculoskeletal: Negative for arthritis, joint pain, muscle cramps, myalgias, neck pain and stiffness.  Gastrointestinal: Negative for abdominal pain, bowel incontinence, diarrhea and excessive appetite.  Genitourinary: Negative for decreased libido, genital sores and incomplete emptying.  Neurological: Negative for brief paralysis, focal weakness, headaches and loss of balance.  Psychiatric/Behavioral: Negative for altered mental status, depression and suicidal ideas.  Allergic/Immunologic: Negative for HIV exposure and persistent infections.    EKGs/Labs/Other Studies Reviewed:    The following studies were reviewed today:   EKG: None today  TTE Impression 08/05/2019 1. Left ventricular ejection fraction, by visual estimation, is 60 to  65%. The left ventricle has normal function. There is no left ventricular  hypertrophy.  2. The left  ventricle has no regional wall motion abnormalities.  3. Global right ventricle has normal systolic function.The right  ventricular size is normal. No increase in right ventricular wall  thickness.  4. Left atrial size was normal.  5. Right atrial size was normal.  6. The mitral valve is normal in structure. Trivial mitral valve  regurgitation. No evidence of mitral stenosis.  7. The tricuspid valve is normal in structure.  8. The tricuspid valve is normal in structure. Tricuspid valve  regurgitation is not demonstrated.  9. The aortic valve is normal in structure. Aortic valve regurgitation is  not visualized. No evidence of aortic valve sclerosis or stenosis.  10. The pulmonic valve was normal in structure. Pulmonic valve  regurgitation is not visualized.  11. The inferior vena cava is dilated in size with >50% respiratory  variability, suggesting right atrial pressure of 8 mmHg.   FINDINGS  Left Ventricle: Left ventricular ejection fraction, by visual estimation,  is 60 to 65%. The left ventricle has normal function. The left ventricle  has no regional wall motion abnormalities. There is no left ventricular  hypertrophy. Left ventricular  diastolic parameters were normal. Normal left atrial pressure.   Right Ventricle: The right ventricular size is normal. No increase in  right ventricular wall thickness. Global RV systolic function is has  normal systolic function.   Left Atrium: Left atrial size was normal in size.   Right Atrium: Right atrial size was normal in size   Pericardium: There is no evidence of pericardial effusion.   Mitral Valve: The mitral valve is normal in structure. Trivial mitral  valve regurgitation. No evidence of mitral valve stenosis by observation.   Tricuspid Valve: The tricuspid valve is normal in structure. Tricuspid  valve regurgitation is not demonstrated.   Aortic Valve: The aortic valve is normal in structure. Aortic valve    regurgitation is not visualized. The aortic valve is structurally normal,  with no evidence of sclerosis or stenosis.   Pulmonic Valve: The pulmonic valve was normal in structure. Pulmonic valve  regurgitation is not visualized. Pulmonic regurgitation is not visualized.   Aorta: The aortic root, ascending aorta and aortic arch are all  structurally normal, with no evidence of dilitation or obstruction.   Venous: The inferior vena cava is dilated in size with greater than 50%  respiratory variability, suggesting right atrial pressure of 8 mmHg.   IAS/Shunts: No atrial level shunt detected by color flow Doppler. There is  no evidence of a patent foramen ovale. No ventricular septal defect is  seen or detected. There is no evidence of an atrial septal defect.      Recent  Labs: 08/03/2019: ALT 16 08/04/2019: Magnesium 2.9 08/06/2019: BUN 13; Creatinine, Ser 0.89; Hemoglobin 9.1; Platelets 100; Potassium 4.7; Sodium 137  Recent Lipid Panel    Component Value Date/Time   CHOL 165 06/14/2018 2326   TRIG 191 (H) 06/14/2018 2326   HDL 64 06/14/2018 2326   CHOLHDL 2.6 06/14/2018 2326   VLDL 38 06/14/2018 2326   LDLCALC 63 06/14/2018 2326    Physical Exam:    VS:  BP 140/60 (BP Location: Right Arm, Patient Position: Sitting, Cuff Size: Normal)   Pulse 75   Ht  (1.702 m)   Wt 194 lb (88 kg)   SpO2 96%   BMI 30.38 kg/m     Wt Readings from Last 3 Encounters:  09/21/19 194 lb (88 kg)  08/06/19 210 lb 1.6 oz (95.3 kg)  07/08/19 196 lb (88.9 kg)     GEN: Well nourished, well developed in no acute distress HEENT: Normal NECK: No JVD; No carotid bruits LYMPHATICS: No lymphadenopathy CARDIAC: S1S2 noted,RRR, no murmurs, rubs, gallops RESPIRATORY:  Mild bibasilar crackels. No wheezing or rhonchi  ABDOMEN: Soft, non-tender, non-distended, +bowel sounds, no guarding. EXTREMITIES:  Bilateral +2 pitting edema, No cyanosis, no clubbing MUSCULOSKELETAL:  No deformity  SKIN: Warm  and dry NEUROLOGIC:  Alert and oriented x 3, non-focal PSYCHIATRIC:  Normal affect, good insight  ASSESSMENT:    1. Bilateral leg edema   2. Dyspnea, unspecified type   3. Heart failure, unspecified HF chronicity, unspecified heart failure type (HCC)   4. Coronary artery disease involving native coronary artery of native heart without angina pectoris   5. Essential hypertension    PLAN:     1. Her clinical exam is consistent with volume overload. I will start with Lasix 40 mg p.o. with potassium supplement.  I like to try oral diuretics see her back in 1 week and see if there is any improvement.  In the meantime, send blood work for BMP, mag, CBC and proBNP.  2.  Her blood pressure is acceptable in the office today.  We will continue to monitor.  3.  Coronary disease continue patient on Plavix, atorvastatin and carvedilol 25 mg twice daily.  4.  We will continue to monitor closely in the setting of her recent upper GI bleeding.  She continues to take her Protonix 40 mg twice a day.  The patient is in agreement with the above plan. The patient left the office in stable condition.  The patient will follow up in 1 week or sooner if needed for diuretic use follow-up.   Medication Adjustments/Labs and Tests Ordered: Current medicines are reviewed at length with the patient today.  Concerns regarding medicines are outlined above.  Orders Placed This Encounter  Procedures  . Basic metabolic panel  . Magnesium  . CBC  . Pro b natriuretic peptide (BNP)   Meds ordered this encounter  Medications  . furosemide (LASIX) 40 MG tablet    Sig: Take 1 tablet (40 mg total) by mouth daily.    Dispense:  30 tablet    Refill:  1  . potassium chloride SA (KLOR-CON) 20 MEQ tablet    Sig: Take 1 tablet (20 mEq total) by mouth daily.    Dispense:  30 tablet    Refill:  1    Patient Instructions  Medication Instructions:  Your physician has recommended you make the following change in your  medication:   START: Lasix 40 mg daily   START: Potassium 20 meq daily   *  If you need a refill on your cardiac medications before your next appointment, please call your pharmacy*   Lab Work: Your physician recommends that you return for lab work today: bmp, mg, cbc, bnp   If you have labs (blood work) drawn today and your tests are completely normal, you will receive your results only by: Marland Kitchen. MyChart Message (if you have MyChart) OR . A paper copy in the mail If you have any lab test that is abnormal or we need to change your treatment, we will call you to review the results.   Testing/Procedures: None.    Follow-Up: At [facility], you and your health needs are our priority.  As part of our continuing mission to provide you with exceptional heart care, we have created designated Provider Care Teams.  These Care Teams include your primary Cardiologist (physician) and Advanced Practice Providers (APPs -  Physician Assistants and Nurse Practitioners) who all work together to provide you with the care you need, when you need it.  We recommend signing up for the patient portal called "MyChart".  Sign up information is provided on this After Visit Summary.  MyChart is used to connect with patients for Virtual Visits (Telemedicine).  Patients are able to view lab/test results, encounter notes, upcoming appointments, etc.  Non-urgent messages can be sent to your provider as well.   To learn more about what you can do with MyChart, go to [email or web address].    Your next appointment:   1 week(s)  The format for your next appointment:   In Person  Provider:   Thomasene RippleKardie Fredrik Mogel, DO   Other Instructions       Adopting a Healthy Lifestyle.  Know what a healthy weight is for you (roughly BMI <25) and aim to maintain this   Aim for 7+ servings of fruits and vegetables daily   65-80+ fluid ounces of water or unsweet tea for healthy kidneys   Limit to max 1 drink of alcohol per  day; avoid smoking/tobacco   Limit animal fats in diet for cholesterol and heart health - choose grass fed whenever available   Avoid highly processed foods, and foods high in saturated/trans fats   Aim for low stress - take time to unwind and care for your mental health   Aim for 150 min of moderate intensity exercise weekly for heart health, and weights twice weekly for bone health   Aim for 7-9 hours of sleep daily   When it comes to diets, agreement about the perfect plan isnt easy to find, even among the experts. Experts at the [employer] developed an idea known as the Healthy Eating Plate. Just imagine a plate divided into logical, healthy portions.   The emphasis is on diet quality:   Load up on vegetables and fruits - one-half of your plate: Aim for color and variety, and remember that potatoes dont count.   Go for whole grains - one-quarter of your plate: Whole wheat, barley, wheat berries, quinoa, oats, brown rice, and foods made with them. If you want pasta, go with whole wheat pasta.   Protein power - one-quarter of your plate: Fish, chicken, beans, and nuts are all healthy, versatile protein sources. Limit red meat.   The diet, however, does go beyond the plate, offering a few other suggestions.   Use healthy plant oils, such as olive, canola, soy, corn, sunflower and peanut. Check the labels, and avoid partially hydrogenated oil, which have unhealthy trans fats.  If youre thirsty, drink water. Coffee and tea are good in moderation, but skip sugary drinks and limit milk and dairy products to one or two daily servings.   The type of carbohydrate in the diet is more important than the amount. Some sources of carbohydrates, such as vegetables, fruits, whole grains, and beans-are healthier than others.   Finally, stay active  Signed, Berniece Salines, DO  09/21/2019 9:39 PM    [facility]

## 2019-09-21 NOTE — Patient Instructions (Signed)
Medication Instructions:  Your physician has recommended you make the following change in your medication:   START: Lasix 40 mg daily   START: Potassium 20 meq daily   *If you need a refill on your cardiac medications before your next appointment, please call your pharmacy*   Lab Work: Your physician recommends that you return for lab work today: bmp, mg, cbc, bnp   If you have labs (blood work) drawn today and your tests are completely normal, you will receive your results only by: Marland Kitchen MyChart Message (if you have MyChart) OR . A paper copy in the mail If you have any lab test that is abnormal or we need to change your treatment, we will call you to review the results.   Testing/Procedures: None.    Follow-Up: At [facility], you and your health needs are our priority.  As part of our continuing mission to provide you with exceptional heart care, we have created designated Provider Care Teams.  These Care Teams include your primary Cardiologist (physician) and Advanced Practice Providers (APPs -  Physician Assistants and Nurse Practitioners) who all work together to provide you with the care you need, when you need it.  We recommend signing up for the patient portal called "MyChart".  Sign up information is provided on this After Visit Summary.  MyChart is used to connect with patients for Virtual Visits (Telemedicine).  Patients are able to view lab/test results, encounter notes, upcoming appointments, etc.  Non-urgent messages can be sent to your provider as well.   To learn more about what you can do with MyChart, go to [email or web address].    Your next appointment:   1 week(s)  The format for your next appointment:   In Person  Provider:   Thomasene Ripple, DO   Other Instructions

## 2019-09-22 LAB — BASIC METABOLIC PANEL
BUN/Creatinine Ratio: 8 — ABNORMAL LOW (ref 9–23)
BUN: 9 mg/dL (ref 6–24)
CO2: 32 mmol/L — ABNORMAL HIGH (ref 20–29)
Calcium: 9.5 mg/dL (ref 8.7–10.2)
Chloride: 92 mmol/L — ABNORMAL LOW (ref 96–106)
Creatinine, Ser: 1.14 mg/dL — ABNORMAL HIGH (ref 0.57–1.00)
GFR calc Af Amer: 63 mL/min/{1.73_m2} (ref >59)
GFR calc non Af Amer: 54 mL/min/{1.73_m2} — ABNORMAL LOW (ref >59)
Glucose: 82 mg/dL (ref 65–99)
Potassium: 2.6 mmol/L — ABNORMAL LOW (ref 3.5–5.2)
Sodium: 142 mmol/L (ref 134–144)

## 2019-09-22 LAB — CBC
Hematocrit: 31.9 % — ABNORMAL LOW (ref 34.0–46.6)
Hemoglobin: 9.7 g/dL — ABNORMAL LOW (ref 11.1–15.9)
MCH: 25.1 pg — ABNORMAL LOW (ref 26.6–33.0)
MCHC: 30.4 g/dL — ABNORMAL LOW (ref 31.5–35.7)
MCV: 83 fL (ref 79–97)
Platelets: 324 10*3/uL (ref 150–450)
RBC: 3.86 x10E6/uL (ref 3.77–5.28)
RDW: 18 % — ABNORMAL HIGH (ref 11.7–15.4)
WBC: 5.5 10*3/uL (ref 3.4–10.8)

## 2019-09-22 LAB — MAGNESIUM: Magnesium: 2.2 mg/dL (ref 1.6–2.3)

## 2019-09-22 LAB — PRO B NATRIURETIC PEPTIDE: NT-Pro BNP: 1337 pg/mL — ABNORMAL HIGH (ref 0–287)

## 2019-09-22 MED ORDER — FUROSEMIDE 40 MG PO TABS: ORAL_TABLET | ORAL | 1 refills | Status: AC

## 2019-09-22 MED ORDER — POTASSIUM CHLORIDE CRYS ER 20 MEQ PO TBCR: EXTENDED_RELEASE_TABLET | ORAL | 1 refills | Status: AC

## 2019-09-22 NOTE — Addendum Note (Signed)
Addended by: Lita Mains on: 09/22/2019 08:28 AM   Modules accepted: Orders

## 2019-09-28 ENCOUNTER — Ambulatory Visit: Payer: Medicaid Other | Admitting: Cardiology

## 2019-10-07 DEATH — deceased
